# Patient Record
Sex: Female | Born: 1965
Health system: Southern US, Community
[De-identification: ages and names within clinical notes are randomized; demographics above are authoritative.]

## PROBLEM LIST (undated history)

## (undated) ENCOUNTER — Encounter: Attending: Family | Primary: Family

## (undated) ENCOUNTER — Ambulatory Visit

## (undated) ENCOUNTER — Telehealth

## (undated) ENCOUNTER — Ambulatory Visit: Payer: PRIVATE HEALTH INSURANCE | Attending: Family | Primary: Family

## (undated) ENCOUNTER — Ambulatory Visit: Payer: PRIVATE HEALTH INSURANCE

## (undated) ENCOUNTER — Encounter

## (undated) ENCOUNTER — Telehealth: Attending: Registered" | Primary: Registered"

## (undated) ENCOUNTER — Ambulatory Visit: Payer: PRIVATE HEALTH INSURANCE | Attending: Registered" | Primary: Registered"

## (undated) ENCOUNTER — Telehealth
Attending: Pharmacist Clinician (PhC)/ Clinical Pharmacy Specialist | Primary: Pharmacist Clinician (PhC)/ Clinical Pharmacy Specialist

## (undated) ENCOUNTER — Telehealth: Attending: Family | Primary: Family

## (undated) ENCOUNTER — Ambulatory Visit
Payer: PRIVATE HEALTH INSURANCE | Attending: Student in an Organized Health Care Education/Training Program | Primary: Student in an Organized Health Care Education/Training Program

## (undated) ENCOUNTER — Ambulatory Visit
Attending: Student in an Organized Health Care Education/Training Program | Primary: Student in an Organized Health Care Education/Training Program

## (undated) ENCOUNTER — Ambulatory Visit: Attending: Family | Primary: Family

## (undated) ENCOUNTER — Ambulatory Visit: Attending: Obstetrics & Gynecology | Primary: Obstetrics & Gynecology

## (undated) ENCOUNTER — Encounter: Attending: Registered" | Primary: Registered"

## (undated) ENCOUNTER — Telehealth: Payer: PRIVATE HEALTH INSURANCE | Attending: Family | Primary: Family

## (undated) ENCOUNTER — Telehealth: Attending: Clinical | Primary: Clinical

## (undated) ENCOUNTER — Telehealth
Attending: Student in an Organized Health Care Education/Training Program | Primary: Student in an Organized Health Care Education/Training Program

## (undated) ENCOUNTER — Encounter
Attending: Student in an Organized Health Care Education/Training Program | Primary: Student in an Organized Health Care Education/Training Program

## (undated) DIAGNOSIS — I10 Essential (primary) hypertension: Secondary | ICD-10-CM

## (undated) DIAGNOSIS — F32A Depression, unspecified: Secondary | ICD-10-CM

## (undated) DIAGNOSIS — F329 Major depressive disorder, single episode, unspecified: Secondary | ICD-10-CM

## (undated) DIAGNOSIS — Z21 Asymptomatic human immunodeficiency virus [HIV] infection status: Secondary | ICD-10-CM

## (undated) DIAGNOSIS — B019 Varicella without complication: Secondary | ICD-10-CM

## (undated) DIAGNOSIS — N390 Urinary tract infection, site not specified: Secondary | ICD-10-CM

## (undated) DIAGNOSIS — B20 Human immunodeficiency virus [HIV] disease: Secondary | ICD-10-CM

## (undated) DIAGNOSIS — F419 Anxiety disorder, unspecified: Secondary | ICD-10-CM

## (undated) HISTORY — DX: Asymptomatic human immunodeficiency virus (hiv) infection status: Z21

## (undated) HISTORY — DX: Depression, unspecified: F32.A

## (undated) HISTORY — DX: Varicella without complication: B01.9

## (undated) HISTORY — DX: Essential (primary) hypertension: I10

## (undated) HISTORY — DX: Urinary tract infection, site not specified: N39.0

## (undated) HISTORY — DX: Anxiety disorder, unspecified: F41.9

## (undated) HISTORY — DX: Human immunodeficiency virus (HIV) disease: B20

## (undated) HISTORY — DX: Major depressive disorder, single episode, unspecified: F32.9

---

## 1987-09-21 HISTORY — PX: ABDOMINAL HYSTERECTOMY: SHX81

## 1998-02-26 ENCOUNTER — Other Ambulatory Visit: Admission: RE | Admit: 1998-02-26 | Discharge: 1998-02-26 | Payer: Self-pay | Admitting: Family Medicine

## 1998-03-10 ENCOUNTER — Emergency Department (HOSPITAL_COMMUNITY): Admission: EM | Admit: 1998-03-10 | Discharge: 1998-03-10 | Payer: Self-pay

## 1998-10-01 ENCOUNTER — Emergency Department (HOSPITAL_COMMUNITY): Admission: EM | Admit: 1998-10-01 | Discharge: 1998-10-01 | Payer: Self-pay | Admitting: Emergency Medicine

## 1998-10-01 ENCOUNTER — Encounter: Payer: Self-pay | Admitting: Emergency Medicine

## 1999-04-10 ENCOUNTER — Emergency Department (HOSPITAL_COMMUNITY): Admission: EM | Admit: 1999-04-10 | Discharge: 1999-04-11 | Payer: Self-pay | Admitting: *Deleted

## 1999-05-21 ENCOUNTER — Emergency Department (HOSPITAL_COMMUNITY): Admission: EM | Admit: 1999-05-21 | Discharge: 1999-05-21 | Payer: Self-pay | Admitting: Emergency Medicine

## 1999-05-30 ENCOUNTER — Emergency Department (HOSPITAL_COMMUNITY): Admission: EM | Admit: 1999-05-30 | Discharge: 1999-05-30 | Payer: Self-pay | Admitting: Emergency Medicine

## 1999-12-16 ENCOUNTER — Emergency Department (HOSPITAL_COMMUNITY): Admission: EM | Admit: 1999-12-16 | Discharge: 1999-12-16 | Payer: Self-pay | Admitting: Emergency Medicine

## 2000-05-29 ENCOUNTER — Emergency Department (HOSPITAL_COMMUNITY): Admission: EM | Admit: 2000-05-29 | Discharge: 2000-05-29 | Payer: Self-pay | Admitting: Emergency Medicine

## 2000-06-28 ENCOUNTER — Emergency Department (HOSPITAL_COMMUNITY): Admission: EM | Admit: 2000-06-28 | Discharge: 2000-06-28 | Payer: Self-pay | Admitting: Emergency Medicine

## 2001-06-18 ENCOUNTER — Emergency Department (HOSPITAL_COMMUNITY): Admission: EM | Admit: 2001-06-18 | Discharge: 2001-06-18 | Payer: Self-pay | Admitting: Emergency Medicine

## 2001-06-25 ENCOUNTER — Encounter: Payer: Self-pay | Admitting: Emergency Medicine

## 2001-06-25 ENCOUNTER — Emergency Department (HOSPITAL_COMMUNITY): Admission: EM | Admit: 2001-06-25 | Discharge: 2001-06-25 | Payer: Self-pay | Admitting: Emergency Medicine

## 2001-10-12 ENCOUNTER — Inpatient Hospital Stay (HOSPITAL_COMMUNITY): Admission: AD | Admit: 2001-10-12 | Discharge: 2001-10-12 | Payer: Self-pay | Admitting: Obstetrics & Gynecology

## 2002-09-07 ENCOUNTER — Encounter: Payer: Self-pay | Admitting: Plastic Surgery

## 2002-09-07 ENCOUNTER — Ambulatory Visit (HOSPITAL_COMMUNITY): Admission: RE | Admit: 2002-09-07 | Discharge: 2002-09-07 | Payer: Self-pay | Admitting: Plastic Surgery

## 2002-12-14 ENCOUNTER — Emergency Department (HOSPITAL_COMMUNITY): Admission: EM | Admit: 2002-12-14 | Discharge: 2002-12-14 | Payer: Self-pay

## 2002-12-20 ENCOUNTER — Emergency Department (HOSPITAL_COMMUNITY): Admission: EM | Admit: 2002-12-20 | Discharge: 2002-12-20 | Payer: Self-pay

## 2004-06-14 ENCOUNTER — Emergency Department (HOSPITAL_COMMUNITY): Admission: EM | Admit: 2004-06-14 | Discharge: 2004-06-14 | Payer: Self-pay | Admitting: Emergency Medicine

## 2004-07-02 ENCOUNTER — Ambulatory Visit: Payer: Self-pay | Admitting: Family Medicine

## 2004-08-06 ENCOUNTER — Ambulatory Visit: Payer: Self-pay | Admitting: Sports Medicine

## 2004-09-20 ENCOUNTER — Encounter (INDEPENDENT_AMBULATORY_CARE_PROVIDER_SITE_OTHER): Payer: Self-pay | Admitting: *Deleted

## 2004-09-20 LAB — CONVERTED CEMR LAB

## 2004-09-22 ENCOUNTER — Ambulatory Visit: Payer: Self-pay | Admitting: Family Medicine

## 2005-01-07 ENCOUNTER — Ambulatory Visit: Payer: Self-pay | Admitting: Sports Medicine

## 2005-01-27 ENCOUNTER — Ambulatory Visit: Payer: Self-pay | Admitting: Family Medicine

## 2005-01-30 ENCOUNTER — Emergency Department (HOSPITAL_COMMUNITY): Admission: EM | Admit: 2005-01-30 | Discharge: 2005-01-30 | Payer: Self-pay | Admitting: Emergency Medicine

## 2005-06-04 ENCOUNTER — Ambulatory Visit: Payer: Self-pay | Admitting: Family Medicine

## 2005-06-11 ENCOUNTER — Ambulatory Visit: Payer: Self-pay | Admitting: Family Medicine

## 2005-07-21 ENCOUNTER — Ambulatory Visit: Payer: Self-pay | Admitting: Family Medicine

## 2005-09-02 ENCOUNTER — Ambulatory Visit: Payer: Self-pay | Admitting: Sports Medicine

## 2005-10-05 ENCOUNTER — Ambulatory Visit: Payer: Self-pay | Admitting: Family Medicine

## 2005-10-22 ENCOUNTER — Ambulatory Visit: Payer: Self-pay | Admitting: Family Medicine

## 2005-11-19 ENCOUNTER — Ambulatory Visit: Payer: Self-pay | Admitting: Sports Medicine

## 2005-11-24 ENCOUNTER — Ambulatory Visit: Payer: Self-pay | Admitting: Family Medicine

## 2005-12-09 ENCOUNTER — Ambulatory Visit: Payer: Self-pay | Admitting: Family Medicine

## 2005-12-27 ENCOUNTER — Ambulatory Visit: Payer: Self-pay | Admitting: Family Medicine

## 2006-01-07 ENCOUNTER — Ambulatory Visit: Payer: Self-pay | Admitting: Family Medicine

## 2006-02-07 ENCOUNTER — Ambulatory Visit: Payer: Self-pay | Admitting: Family Medicine

## 2006-02-28 ENCOUNTER — Ambulatory Visit: Payer: Self-pay | Admitting: Family Medicine

## 2006-03-25 ENCOUNTER — Other Ambulatory Visit: Admission: RE | Admit: 2006-03-25 | Discharge: 2006-03-25 | Payer: Self-pay | Admitting: Obstetrics and Gynecology

## 2006-03-29 ENCOUNTER — Ambulatory Visit (HOSPITAL_COMMUNITY): Admission: RE | Admit: 2006-03-29 | Discharge: 2006-03-29 | Payer: Self-pay | Admitting: Obstetrics and Gynecology

## 2006-11-17 DIAGNOSIS — F339 Major depressive disorder, recurrent, unspecified: Secondary | ICD-10-CM | POA: Insufficient documentation

## 2006-11-17 DIAGNOSIS — I10 Essential (primary) hypertension: Secondary | ICD-10-CM | POA: Insufficient documentation

## 2006-11-17 DIAGNOSIS — E669 Obesity, unspecified: Secondary | ICD-10-CM | POA: Insufficient documentation

## 2006-11-17 DIAGNOSIS — B20 Human immunodeficiency virus [HIV] disease: Secondary | ICD-10-CM | POA: Insufficient documentation

## 2006-11-18 ENCOUNTER — Encounter (INDEPENDENT_AMBULATORY_CARE_PROVIDER_SITE_OTHER): Payer: Self-pay | Admitting: *Deleted

## 2008-05-09 ENCOUNTER — Emergency Department (HOSPITAL_COMMUNITY): Admission: EM | Admit: 2008-05-09 | Discharge: 2008-05-09 | Payer: Self-pay | Admitting: *Deleted

## 2010-07-09 ENCOUNTER — Emergency Department (HOSPITAL_COMMUNITY): Admission: EM | Admit: 2010-07-09 | Discharge: 2010-07-09 | Payer: Self-pay | Admitting: Emergency Medicine

## 2010-07-21 ENCOUNTER — Emergency Department (HOSPITAL_COMMUNITY): Admission: EM | Admit: 2010-07-21 | Discharge: 2010-07-21 | Payer: Self-pay | Admitting: Emergency Medicine

## 2010-10-11 ENCOUNTER — Encounter: Payer: Self-pay | Admitting: Obstetrics and Gynecology

## 2010-12-01 LAB — URINALYSIS, ROUTINE W REFLEX MICROSCOPIC
Bilirubin Urine: NEGATIVE
Glucose, UA: NEGATIVE mg/dL
Hgb urine dipstick: NEGATIVE
Ketones, ur: NEGATIVE mg/dL
Nitrite: NEGATIVE
Protein, ur: NEGATIVE mg/dL
Specific Gravity, Urine: 1.022 (ref 1.005–1.030)
Urobilinogen, UA: 0.2 mg/dL (ref 0.0–1.0)
pH: 6 (ref 5.0–8.0)

## 2010-12-01 LAB — URINE MICROSCOPIC-ADD ON

## 2010-12-01 LAB — CBC
HCT: 39.1 % (ref 36.0–46.0)
Hemoglobin: 12.7 g/dL (ref 12.0–15.0)
MCH: 29.3 pg (ref 26.0–34.0)
MCHC: 32.5 g/dL (ref 30.0–36.0)
MCV: 90.1 fL (ref 78.0–100.0)
Platelets: 375 10*3/uL (ref 150–400)
RBC: 4.34 MIL/uL (ref 3.87–5.11)
RDW: 13.3 % (ref 11.5–15.5)
WBC: 4.1 10*3/uL (ref 4.0–10.5)

## 2010-12-01 LAB — POCT I-STAT, CHEM 8
BUN: 15 mg/dL (ref 6–23)
Calcium, Ion: 1.12 mmol/L (ref 1.12–1.32)
Chloride: 102 mEq/L (ref 96–112)
Creatinine, Ser: 1 mg/dL (ref 0.4–1.2)
Glucose, Bld: 99 mg/dL (ref 70–99)
HCT: 42 % (ref 36.0–46.0)
Hemoglobin: 14.3 g/dL (ref 12.0–15.0)
Potassium: 3.5 mEq/L (ref 3.5–5.1)
Sodium: 140 mEq/L (ref 135–145)
TCO2: 28 mmol/L (ref 0–100)

## 2010-12-01 LAB — DIFFERENTIAL
Basophils Absolute: 0 10*3/uL (ref 0.0–0.1)
Basophils Relative: 1 % (ref 0–1)
Eosinophils Absolute: 0.2 10*3/uL (ref 0.0–0.7)
Eosinophils Relative: 4 % (ref 0–5)
Lymphocytes Relative: 31 % (ref 12–46)
Lymphs Abs: 1.3 10*3/uL (ref 0.7–4.0)
Monocytes Absolute: 0.3 10*3/uL (ref 0.1–1.0)
Monocytes Relative: 6 % (ref 3–12)
Neutro Abs: 2.4 10*3/uL (ref 1.7–7.7)
Neutrophils Relative %: 58 % (ref 43–77)

## 2011-05-02 ENCOUNTER — Emergency Department (HOSPITAL_COMMUNITY): Payer: BC Managed Care – PPO

## 2011-05-02 ENCOUNTER — Emergency Department (HOSPITAL_COMMUNITY)
Admission: EM | Admit: 2011-05-02 | Discharge: 2011-05-02 | Disposition: A | Discharge status: Home / Self Care | Payer: BC Managed Care – PPO | Attending: Emergency Medicine | Admitting: Emergency Medicine

## 2011-05-02 DIAGNOSIS — R5381 Other malaise: Secondary | ICD-10-CM | POA: Insufficient documentation

## 2011-05-02 DIAGNOSIS — H538 Other visual disturbances: Secondary | ICD-10-CM | POA: Insufficient documentation

## 2011-05-02 DIAGNOSIS — R29898 Other symptoms and signs involving the musculoskeletal system: Secondary | ICD-10-CM | POA: Insufficient documentation

## 2011-05-02 DIAGNOSIS — Z21 Asymptomatic human immunodeficiency virus [HIV] infection status: Secondary | ICD-10-CM | POA: Insufficient documentation

## 2011-05-02 DIAGNOSIS — H5789 Other specified disorders of eye and adnexa: Secondary | ICD-10-CM | POA: Insufficient documentation

## 2011-05-02 DIAGNOSIS — R5383 Other fatigue: Secondary | ICD-10-CM | POA: Insufficient documentation

## 2011-05-02 DIAGNOSIS — I10 Essential (primary) hypertension: Secondary | ICD-10-CM | POA: Insufficient documentation

## 2011-05-02 DIAGNOSIS — F329 Major depressive disorder, single episode, unspecified: Secondary | ICD-10-CM | POA: Insufficient documentation

## 2011-05-02 DIAGNOSIS — F3289 Other specified depressive episodes: Secondary | ICD-10-CM | POA: Insufficient documentation

## 2011-07-05 ENCOUNTER — Emergency Department (HOSPITAL_COMMUNITY)
Admission: EM | Admit: 2011-07-05 | Discharge: 2011-07-05 | Disposition: A | Discharge status: Home / Self Care | Payer: BC Managed Care – PPO | Attending: Emergency Medicine | Admitting: Emergency Medicine

## 2011-07-05 DIAGNOSIS — Z21 Asymptomatic human immunodeficiency virus [HIV] infection status: Secondary | ICD-10-CM | POA: Insufficient documentation

## 2011-07-05 DIAGNOSIS — F329 Major depressive disorder, single episode, unspecified: Secondary | ICD-10-CM | POA: Insufficient documentation

## 2011-07-05 DIAGNOSIS — Z79899 Other long term (current) drug therapy: Secondary | ICD-10-CM | POA: Insufficient documentation

## 2011-07-05 DIAGNOSIS — F3289 Other specified depressive episodes: Secondary | ICD-10-CM | POA: Insufficient documentation

## 2011-07-05 DIAGNOSIS — I10 Essential (primary) hypertension: Secondary | ICD-10-CM | POA: Insufficient documentation

## 2011-07-05 DIAGNOSIS — R109 Unspecified abdominal pain: Secondary | ICD-10-CM | POA: Insufficient documentation

## 2011-07-05 DIAGNOSIS — K5900 Constipation, unspecified: Secondary | ICD-10-CM | POA: Insufficient documentation

## 2011-10-28 ENCOUNTER — Ambulatory Visit (INDEPENDENT_AMBULATORY_CARE_PROVIDER_SITE_OTHER): Payer: BC Managed Care – PPO | Admitting: Family Medicine

## 2011-10-28 ENCOUNTER — Encounter: Payer: Self-pay | Admitting: Family Medicine

## 2011-10-28 DIAGNOSIS — E669 Obesity, unspecified: Secondary | ICD-10-CM

## 2011-10-28 DIAGNOSIS — B20 Human immunodeficiency virus [HIV] disease: Secondary | ICD-10-CM

## 2011-10-28 DIAGNOSIS — I10 Essential (primary) hypertension: Secondary | ICD-10-CM

## 2011-10-28 DIAGNOSIS — F339 Major depressive disorder, recurrent, unspecified: Secondary | ICD-10-CM

## 2011-10-28 NOTE — Progress Notes (Signed)
  Subjective:    Patient ID: Kelli Stout, female    DOB: Nov 22, 1965, 46 y.o.   MRN: [id number]  HPI  New patient to establish care. Patient has history of HIV followed by another clinic, hypertension, of recurrent depression, and obesity. She has recently started exercise program at home with walking and jogging on a treadmill. She has had previous partial hysterectomy secondary to fibroids 1989. Medications reviewed as outlined. Compliant with all. Recent postmenopausal hot flashes. She was started by gynecologist on Effexor and clonidine for that and still has some breakthrough.  Hypertension treated with Maxzide and Norvasc. Blood pressure is well controlled. No dizziness. No recent chest pains.  Family history as recorded elsewhere. Strong family history of hypertension and hyperlipidemia.  Patient is widowed. She works as a Advertising copywriter. No history of smoking and rare alcohol use.  Review of Systems  Constitutional: Negative for fever, activity change, appetite change, fatigue and unexpected weight change.  HENT: Negative for hearing loss, ear pain, sore throat and trouble swallowing.   Eyes: Negative for visual disturbance.  Respiratory: Negative for cough and shortness of breath.   Cardiovascular: Negative for chest pain and palpitations.  Gastrointestinal: Negative for abdominal pain, diarrhea, constipation and blood in stool.  Genitourinary: Negative for dysuria and hematuria.  Musculoskeletal: Negative for myalgias, back pain and arthralgias.  Skin: Negative for rash.  Neurological: Negative for dizziness, syncope and headaches.  Hematological: Negative for adenopathy.  Psychiatric/Behavioral: Negative for confusion and dysphoric mood.       Objective:   Physical Exam  Constitutional: She is oriented to person, place, and time. She appears well-developed and well-nourished. No distress.  HENT:  Mouth/Throat: Oropharynx is clear and moist.  Neck: Neck supple. No  thyromegaly present.  Cardiovascular: Normal rate and regular rhythm.   Pulmonary/Chest: Effort normal and breath sounds normal. No respiratory distress. She has no wheezes. She has no rales.  Musculoskeletal: She exhibits no edema.  Lymphadenopathy:    She has no cervical adenopathy.  Neurological: She is alert and oriented to person, place, and time.  Psychiatric: She has a normal mood and affect. Her behavior is normal.          Assessment & Plan:  #1 obesity. Discussed weight loss strategies. She has recently started regular exercise program.  #2 hypertension stable medical current medications  #3 HIV-positive followed at infectious disease clinic elsewhere  #4 history of recurrent depression stable on Effexor #5 health maintenance schedule complete physical

## 2012-02-22 ENCOUNTER — Encounter (HOSPITAL_COMMUNITY): Payer: Self-pay | Admitting: Emergency Medicine

## 2012-02-22 ENCOUNTER — Emergency Department (HOSPITAL_COMMUNITY): Payer: BC Managed Care – PPO

## 2012-02-22 ENCOUNTER — Emergency Department (HOSPITAL_COMMUNITY)
Admission: EM | Admit: 2012-02-22 | Discharge: 2012-02-22 | Discharge status: Against Medical Advice | Payer: BC Managed Care – PPO | Attending: Emergency Medicine | Admitting: Emergency Medicine

## 2012-02-22 DIAGNOSIS — H539 Unspecified visual disturbance: Secondary | ICD-10-CM | POA: Insufficient documentation

## 2012-02-22 DIAGNOSIS — R42 Dizziness and giddiness: Secondary | ICD-10-CM | POA: Insufficient documentation

## 2012-02-22 DIAGNOSIS — R110 Nausea: Secondary | ICD-10-CM | POA: Insufficient documentation

## 2012-02-22 DIAGNOSIS — H53149 Visual discomfort, unspecified: Secondary | ICD-10-CM | POA: Insufficient documentation

## 2012-02-22 DIAGNOSIS — R51 Headache: Secondary | ICD-10-CM | POA: Insufficient documentation

## 2012-02-22 MED ORDER — DEXAMETHASONE SODIUM PHOSPHATE 10 MG/ML IJ SOLN
10.0000 mg | Freq: Once | INTRAMUSCULAR | Status: AC
  Administered 2012-02-22: 10 mg via INTRAVENOUS
  Filled 2012-02-22: qty 1

## 2012-02-22 MED ORDER — METOCLOPRAMIDE HCL 5 MG/ML IJ SOLN
10.0000 mg | Freq: Once | INTRAMUSCULAR | Status: AC
  Administered 2012-02-22: 10 mg via INTRAVENOUS
  Filled 2012-02-22: qty 2

## 2012-02-22 MED ORDER — SODIUM CHLORIDE 0.9 % IV BOLUS (SEPSIS)
1000.0000 mL | Freq: Once | INTRAVENOUS | Status: AC
  Administered 2012-02-22: 1000 mL via INTRAVENOUS

## 2012-02-22 MED ORDER — DIPHENHYDRAMINE HCL 50 MG/ML IJ SOLN
25.0000 mg | Freq: Once | INTRAMUSCULAR | Status: AC
  Administered 2012-02-22: 25 mg via INTRAVENOUS
  Filled 2012-02-22: qty 1

## 2012-02-22 NOTE — ED Notes (Signed)
Patient c/o headache everyday when she waits up x 2-3 weeks. C/o periods of dizziness. With occ. Blurred vision. No change in headache today states they just keep coming back.

## 2012-02-22 NOTE — ED Provider Notes (Signed)
History     CSN: [id number]  Arrival date & time 02/22/12  0406   First MD Initiated Contact with Patient 02/22/12 [phone]      Chief Complaint  Patient presents with  . Headache    (Consider location/radiation/quality/duration/timing/severity/associated sxs/prior treatment) HPI Comments: Patient with PMH significant for HIV who is compliant on her retroviral and reports last viral load as undetectable and CD4 count of 800 presents with a three week history of parietal headaches with radiation to frontal sinuses - she reports nausea, dizziness and photophobia associated with this.  She denies nasal congestion, throbbing nature to the pain, fever, chills, neck pain or stiffness.  States she has been trying OTC medications without relief - she reports that the headaches are present in the mornings, and ease on their own through the day.  She reports occasional blurred vision.    Patient is a 46 y.o. female presenting with headaches. The history is provided by the patient. No language interpreter was used.  Headache  This is a new problem. The current episode started more than 1 week ago. The problem occurs constantly. The problem has not changed since onset.The headache is associated with bright light and loud noise. The pain is located in the frontal region. The quality of the pain is described as sharp. The pain is at a severity of 7/10. The pain is moderate. The pain radiates to the face. Associated symptoms include nausea. Pertinent negatives include no anorexia, no fever, no malaise/fatigue, no chest pressure, no near-syncope, no orthopnea, no palpitations, no syncope, no shortness of breath and no vomiting. She has tried nothing for the symptoms. The treatment provided no relief.    Past Medical History  Diagnosis Date  . Chicken pox   . Depression   . Hypertension   . UTI (urinary tract infection)     Past Surgical History  Procedure Date  . Abdominal hysterectomy 1989    partial     Family History  Problem Relation Age of Onset  . Hyperlipidemia Mother   . Heart disease Mother   . Hypertension Mother   . Mental illness Mother   . Hypertension Father   . Diabetes Father     type ll  . Heart disease Maternal Uncle   . Arthritis Maternal Grandmother   . Cancer Maternal Grandmother     bladder  . Cancer Son 17    osteosarcoma    History  Substance Use Topics  . Smoking status: Never Smoker   . Smokeless tobacco: Not on file  . Alcohol Use: No    OB History    Grav Para Term Preterm Abortions TAB SAB Ect Mult Living                  Review of Systems  Constitutional: Negative for fever, chills and malaise/fatigue.  HENT: Negative for nosebleeds, congestion, facial swelling, rhinorrhea, neck pain, neck stiffness and sinus pressure.   Eyes: Positive for photophobia and visual disturbance.  Respiratory: Negative for shortness of breath.   Cardiovascular: Negative for chest pain, palpitations, orthopnea, syncope and near-syncope.  Gastrointestinal: Positive for nausea. Negative for vomiting, abdominal pain and anorexia.  Genitourinary: Negative for dysuria.  Musculoskeletal: Negative for back pain.  Skin: Negative for rash.  Neurological: Positive for dizziness and headaches. Negative for syncope, speech difficulty, weakness and numbness.  Psychiatric/Behavioral: Negative for confusion.  All other systems reviewed and are negative.    Allergies  Review of patient's allergies indicates no known allergies.  Home Medications   Current Outpatient Rx  Name Route Sig Dispense Refill  . AMLODIPINE BESYLATE 5 MG PO TABS Oral Take 5 mg by mouth daily. 1/2 tab daily    . CETIRIZINE HCL 10 MG PO TABS Oral Take 10 mg by mouth daily.    Marland Kitchen CLONIDINE HCL 0.2 MG PO TABS Oral Take 0.2 mg by mouth 2 (two) times daily as needed.    Marland Kitchen RILPIVIRINE HCL 25 MG PO TABS Oral Take 25 mg by mouth daily.    . TRIAMTERENE-HCTZ 37.5-25 MG PO CAPS  1/2 tab daily    .  VALACYCLOVIR HCL 500 MG PO TABS Oral Take 500 mg by mouth daily.    . VENLAFAXINE HCL ER 75 MG PO CP24 Oral Take 75 mg by mouth daily.      BP 129/83  Pulse 94  Temp(Src) 98 F (36.7 C) (Oral)  Resp 16  SpO2 100%  Physical Exam  Nursing note and vitals reviewed. Constitutional: She is oriented to person, place, and time. She appears well-developed and well-nourished. No distress.  HENT:  Head: Normocephalic and atraumatic.  Right Ear: External ear normal.  Left Ear: External ear normal.  Nose: Nose normal.  Mouth/Throat: Oropharynx is clear and moist. No oropharyngeal exudate.       No scalp tenderness - no maxillary sinus or frontal sinus ttp  Eyes: Conjunctivae and EOM are normal. Pupils are equal, round, and reactive to light. Right eye exhibits no discharge. Left eye exhibits no discharge. No scleral icterus.  Neck: Normal range of motion. Neck supple. No Brudzinski's sign and no Kernig's sign noted.  Cardiovascular: Normal rate, regular rhythm and normal heart sounds.  Exam reveals no gallop and no friction rub.   No murmur heard. Pulmonary/Chest: Effort normal and breath sounds normal. No respiratory distress. She has no wheezes. She has no rales. She exhibits no tenderness.  Abdominal: Soft. Bowel sounds are normal. She exhibits no distension. There is no tenderness.  Musculoskeletal: Normal range of motion. She exhibits no edema and no tenderness.  Lymphadenopathy:    She has no cervical adenopathy.  Neurological: She is alert and oriented to person, place, and time. She has normal strength and normal reflexes. She displays no tremor. No cranial nerve deficit or sensory deficit. She exhibits normal muscle tone. Coordination and gait normal. GCS eye subscore is 4. GCS verbal subscore is 5. GCS motor subscore is 6.  Skin: Skin is warm and dry. No rash noted. No erythema. No pallor.  Psychiatric: She has a normal mood and affect. Her behavior is normal. Judgment and thought  content normal.    ED Course  Procedures (including critical care time)  Labs Reviewed - No data to display Ct Head Wo Contrast  02/22/2012  *RADIOLOGY REPORT*  Clinical Data: Headache.  CT HEAD WITHOUT CONTRAST  Technique:  Contiguous axial images were obtained from the base of the skull through the vertex without contrast.  Comparison: 05/02/2011.  Findings: The ventricles are normal.  No extra-axial fluid collections are seen.  The brainstem and cerebellum are unremarkable.  No acute intracranial findings such as infarction or hemorrhage.  No mass lesions.  The bony calvarium is intact.  The visualized paranasal sinuses and mastoid air cells are clear.  Cavum septum pellucidum again noted.  IMPRESSION: No acute intracranial findings or mass lesions.  No change since prior study.  Original Report Authenticated By: P. Loralie Champagne, M.D.     Headache    MDM  Patient  here with daily headaches for the past 3 weeks usually in the mornings, I do not suspect HIV/AIDS sequella as the patient is compliant with medication regimen and therefore not immunocompromised.  CT scan was normal.  The patient was given the "migraine cocktail" but then I was informed by the nurse that the patient had pulled out her own IV and left without signing paperwork.         Izola Price [place], [place] 02/22/12 [phone]

## 2012-02-22 NOTE — ED Notes (Signed)
Patient pulled her IV out and was walking out . I stopped the patient and ask her why she was leaving states she just needs to go. I ask her to wait and speak with the MD states she  Just had to go. Refused to speak to MD.

## 2012-02-22 NOTE — ED Provider Notes (Signed)
Medical screening examination/treatment/procedure(s) were performed by non-physician practitioner and as supervising physician I was immediately available for consultation/collaboration.    Vida Roller, MD 02/22/12 [phone]

## 2012-02-22 NOTE — ED Notes (Signed)
Patient complaining of headaches upon awakening for the past two weeks; patient states that she is also experiencing light sensitivity and sound sensitivity.

## 2012-04-10 ENCOUNTER — Other Ambulatory Visit: Payer: Self-pay | Admitting: Nephrology

## 2012-04-10 DIAGNOSIS — I10 Essential (primary) hypertension: Secondary | ICD-10-CM

## 2012-04-10 DIAGNOSIS — N182 Chronic kidney disease, stage 2 (mild): Secondary | ICD-10-CM

## 2012-04-14 ENCOUNTER — Ambulatory Visit
Admission: RE | Admit: 2012-04-14 | Discharge: 2012-04-14 | Disposition: A | Discharge status: Home / Self Care | Payer: BC Managed Care – PPO | Source: Ambulatory Visit | Attending: Nephrology | Admitting: Nephrology

## 2012-04-14 DIAGNOSIS — I10 Essential (primary) hypertension: Secondary | ICD-10-CM

## 2012-04-14 DIAGNOSIS — N182 Chronic kidney disease, stage 2 (mild): Secondary | ICD-10-CM

## 2012-05-26 ENCOUNTER — Telehealth: Payer: Self-pay | Admitting: *Deleted

## 2012-05-26 ENCOUNTER — Ambulatory Visit: Payer: BC Managed Care – PPO | Admitting: Family Medicine

## 2012-05-26 DIAGNOSIS — Z0289 Encounter for other administrative examinations: Secondary | ICD-10-CM

## 2012-05-26 NOTE — Telephone Encounter (Signed)
Pt no show for 7 month follow-up, message left on home phone VM

## 2012-06-07 ENCOUNTER — Other Ambulatory Visit: Payer: Self-pay | Admitting: *Deleted

## 2012-06-07 ENCOUNTER — Encounter: Payer: Self-pay | Admitting: Family Medicine

## 2012-06-07 ENCOUNTER — Ambulatory Visit (INDEPENDENT_AMBULATORY_CARE_PROVIDER_SITE_OTHER): Payer: BC Managed Care – PPO | Admitting: Family Medicine

## 2012-06-07 VITALS — BP 104/70 | Temp 98.2°F | Wt 270.0 lb

## 2012-06-07 DIAGNOSIS — I10 Essential (primary) hypertension: Secondary | ICD-10-CM

## 2012-06-07 DIAGNOSIS — Z23 Encounter for immunization: Secondary | ICD-10-CM

## 2012-06-07 LAB — GLUCOSE, POCT (MANUAL RESULT ENTRY)

## 2012-06-07 NOTE — Progress Notes (Signed)
  Subjective:    Patient ID: Kelli Stout, female    DOB: [birth year], 46 y.o.   MRN: [id number]  HPI  Patient seen for medical followup. She has history of HIV which is followed closely in [place]. She apparently has been very stable for several years "with no trace of HIV at this time"-per patient. She remains on 2 drug regimen. Hypertension treated with amlodipine and Dyazide. Compliant with medications. Lost 14 pounds since last visit due to her efforts. Exercising fairly regularly. Overall feels better. No orthostasis. No chest pains. No peripheral edema.  She has been treated by GYN with clonidine. This was used for hot flushes and not for hypertension. Began, no orthostasis. Still has some intermittent hot flushes.  Past Medical History  Diagnosis Date  . Chicken pox   . Depression   . Hypertension   . UTI (urinary tract infection)    Past Surgical History  Procedure Date  . Abdominal hysterectomy 1989    partial    reports that she has never smoked. She does not have any smokeless tobacco history on file. She reports that she does not drink alcohol or use illicit drugs. family history includes Arthritis in her maternal grandmother; Cancer in her maternal grandmother; Cancer (age of onset:17) in her son; Diabetes in her father; Heart disease in her maternal uncle and mother; Hyperlipidemia in her mother; Hypertension in her father and mother; and Mental illness in her mother. No Known Allergies    Review of Systems  Constitutional: Negative for fatigue.  Eyes: Negative for visual disturbance.  Respiratory: Negative for cough, chest tightness, shortness of breath and wheezing.   Cardiovascular: Negative for chest pain, palpitations and leg swelling.  Neurological: Negative for dizziness, seizures, syncope, weakness, light-headedness and headaches.       Objective:   Physical Exam  Constitutional: She appears well-developed and well-nourished.  Neck: Neck supple.  No thyromegaly present.  Cardiovascular: Normal rate and regular rhythm.   Pulmonary/Chest: Effort normal and breath sounds normal. No respiratory distress. She has no wheezes. She has no rales.  Musculoskeletal: She exhibits no edema.  Lymphadenopathy:    She has no cervical adenopathy.          Assessment & Plan:  Hypertension. Very well controlled. Discussed possible tapering off clonidine if she considers hot flushes adequately controlled down the road. Continue weight loss efforts. Check basic metabolic panel. Influenza vaccine given. We have suggested complete physical at some point later this year.

## 2012-06-07 NOTE — Patient Instructions (Signed)
Entered POC CBG in error, wrong chart, please do not charge.  Thank you

## 2012-06-08 LAB — BASIC METABOLIC PANEL
BUN: 15 mg/dL (ref 6–23)
CO2: 28 mEq/L (ref 19–32)
Calcium: 8.7 mg/dL (ref 8.4–10.5)
Chloride: 105 mEq/L (ref 96–112)
Creatinine, Ser: 1.3 mg/dL — ABNORMAL HIGH (ref 0.4–1.2)
GFR: 55.58 mL/min — ABNORMAL LOW (ref >60.00)
Glucose, Bld: 96 mg/dL (ref 70–99)
Potassium: 4 mEq/L (ref 3.5–5.1)
Sodium: 139 mEq/L (ref 135–145)

## 2012-06-08 NOTE — Progress Notes (Signed)
Quick Note:  Left a message for pt to return call. ______ 

## 2012-06-09 ENCOUNTER — Telehealth: Payer: Self-pay | Admitting: Family Medicine

## 2012-06-09 NOTE — Telephone Encounter (Signed)
Patient is aware of lab results.

## 2012-06-09 NOTE — Telephone Encounter (Signed)
Pt would like blood work results °

## 2012-07-04 ENCOUNTER — Ambulatory Visit (INDEPENDENT_AMBULATORY_CARE_PROVIDER_SITE_OTHER): Payer: BC Managed Care – PPO | Admitting: Internal Medicine

## 2012-07-04 ENCOUNTER — Encounter: Payer: Self-pay | Admitting: Internal Medicine

## 2012-07-04 VITALS — BP 120/80 | Temp 98.0°F | Wt 271.0 lb

## 2012-07-04 DIAGNOSIS — I10 Essential (primary) hypertension: Secondary | ICD-10-CM

## 2012-07-04 DIAGNOSIS — J40 Bronchitis, not specified as acute or chronic: Secondary | ICD-10-CM

## 2012-07-04 DIAGNOSIS — M545 Low back pain, unspecified: Secondary | ICD-10-CM

## 2012-07-04 LAB — POCT URINALYSIS DIPSTICK
Bilirubin, UA: NEGATIVE
Glucose, UA: NEGATIVE
Ketones, UA: NEGATIVE
Leukocytes, UA: NEGATIVE
Nitrite, UA: NEGATIVE
Spec Grav, UA: >=1.03
Urobilinogen, UA: 0.2
pH, UA: 6

## 2012-07-04 MED ORDER — DOXYCYCLINE HYCLATE 100 MG PO TABS: 100.0000 mg | ORAL_TABLET | Freq: Two times a day (BID) | ORAL | Status: DC

## 2012-07-04 MED ORDER — HYDROCODONE-HOMATROPINE 5-1.5 MG/5ML PO SYRP: 5.0000 mL | ORAL_SOLUTION | Freq: Four times a day (QID) | ORAL | Status: AC | PRN

## 2012-07-04 NOTE — Progress Notes (Signed)
Subjective:    Patient ID: Kelli Stout, female    DOB: 1966-05-05, 46 y.o.   MRN: [id number]  HPI 46 year old patient history of HIV and hypertension who presents with a two-week history of head and chest congestion. More recently she has developed low-grade fever and some nocturnal chills cough is productive of yellow sputum. No shortness of breath chest pain or wheezing.  Past Medical History  Diagnosis Date  . Chicken pox   . Depression   . Hypertension   . UTI (urinary tract infection)     History   Social History  . Marital Status: Single    Spouse Name: N/A    Number of Children: N/A  . Years of Education: N/A   Occupational History  . Not on file.   Social History Main Topics  . Smoking status: Never Smoker   . Smokeless tobacco: Not on file  . Alcohol Use: No  . Drug Use: No  . Sexually Active: Not on file   Other Topics Concern  . Not on file   Social History Narrative  . No narrative on file    Past Surgical History  Procedure Date  . Abdominal hysterectomy 1989    partial    Family History  Problem Relation Age of Onset  . Hyperlipidemia Mother   . Heart disease Mother   . Hypertension Mother   . Mental illness Mother   . Hypertension Father   . Diabetes Father     type ll  . Heart disease Maternal Uncle   . Arthritis Maternal Grandmother   . Cancer Maternal Grandmother     bladder  . Cancer Son 17    osteosarcoma    No Known Allergies  Current Outpatient Prescriptions on File Prior to Visit  Medication Sig Dispense Refill  . amLODipine (NORVASC) 5 MG tablet Take 5 mg by mouth daily. 1/2 tab daily      . cetirizine (ZYRTEC) 10 MG tablet Take 10 mg by mouth daily.      . cloNIDine (CATAPRES) 0.2 MG tablet Take 0.2 mg by mouth 2 (two) times daily as needed.      . rilpivirine (ENDURANT) 25 MG TABS tablet Take 25 mg by mouth daily.      Marland Kitchen triamterene-hydrochlorothiazide (DYAZIDE) 37.5-25 MG per capsule 1/2 tab daily      . TRUVADA  200-300 MG per tablet Take 1 tablet by mouth daily. Per Dr Corine Shelter      . valACYclovir (VALTREX) 500 MG tablet Take 500 mg by mouth daily.      Marland Kitchen venlafaxine (EFFEXOR-XR) 75 MG 24 hr capsule Take 75 mg by mouth daily.        BP 120/80  Temp 98 F (36.7 C) (Oral)  Wt 271 lb (122.925 kg)       Review of Systems  Constitutional: Positive for fever, chills and fatigue.  HENT: Negative for hearing loss, congestion, sore throat, rhinorrhea, dental problem, sinus pressure and tinnitus.   Eyes: Negative for pain, discharge and visual disturbance.  Respiratory: Positive for cough. Negative for shortness of breath.   Cardiovascular: Negative for chest pain, palpitations and leg swelling.  Gastrointestinal: Negative for nausea, vomiting, abdominal pain, diarrhea, constipation, blood in stool and abdominal distention.  Genitourinary: Negative for dysuria, urgency, frequency, hematuria, flank pain, vaginal bleeding, vaginal discharge, difficulty urinating, vaginal pain and pelvic pain.  Musculoskeletal: Negative for joint swelling, arthralgias and gait problem.  Skin: Negative for rash.  Neurological: Negative for dizziness, syncope, speech  difficulty, weakness, numbness and headaches.  Hematological: Negative for adenopathy.  Psychiatric/Behavioral: Negative for behavioral problems, dysphoric mood and agitation. The patient is not nervous/anxious.        Objective:   Physical Exam  Constitutional: She is oriented to person, place, and time. She appears well-developed and well-nourished. No distress.  HENT:  Head: Normocephalic.  Right Ear: External ear normal.  Left Ear: External ear normal.  Mouth/Throat: Oropharynx is clear and moist.  Eyes: Conjunctivae normal and EOM are normal. Pupils are equal, round, and reactive to light.  Neck: Normal range of motion. Neck supple. No thyromegaly present.  Cardiovascular: Normal rate, regular rhythm, normal heart sounds and intact distal pulses.    Pulmonary/Chest: Effort normal.       Few scattered rhonchi  Abdominal: Soft. Bowel sounds are normal. She exhibits no mass. There is no tenderness.  Musculoskeletal: Normal range of motion.  Lymphadenopathy:    She has no cervical adenopathy.  Neurological: She is alert and oriented to person, place, and time.  Skin: Skin is warm and dry. No rash noted.  Psychiatric: She has a normal mood and affect. Her behavior is normal.          Assessment & Plan:   URI/ bronchitis. Will treat symptomatically. Also doxycycline 100 twice a day for 7 days

## 2012-07-04 NOTE — Patient Instructions (Signed)
    Use saline irrigation, warm  moist compresses and over-the-counter decongestants only as directed.  Call if there is no improvement in 5 to 7 days, or sooner if you develop increasing pain, fever, or any new symptoms.  Take your antibiotic as prescribed until ALL of it is gone, but stop if you develop a rash, swelling, or any side effects of the medication.  Contact our office as soon as possible if  there are side effects of the medication. 

## 2012-08-10 ENCOUNTER — Encounter: Payer: Self-pay | Admitting: Family Medicine

## 2012-08-10 ENCOUNTER — Other Ambulatory Visit: Payer: Self-pay | Admitting: *Deleted

## 2012-08-10 ENCOUNTER — Ambulatory Visit (INDEPENDENT_AMBULATORY_CARE_PROVIDER_SITE_OTHER): Payer: BC Managed Care – PPO | Admitting: Family Medicine

## 2012-08-10 VITALS — BP 110/78 | Temp 98.2°F | Wt 277.0 lb

## 2012-08-10 DIAGNOSIS — R3915 Urgency of urination: Secondary | ICD-10-CM

## 2012-08-10 LAB — POCT URINALYSIS DIPSTICK
Bilirubin, UA: NEGATIVE
Glucose, UA: NEGATIVE
Ketones, UA: NEGATIVE
Leukocytes, UA: NEGATIVE
Nitrite, UA: NEGATIVE
Protein, UA: NEGATIVE
Spec Grav, UA: 1.01
Urobilinogen, UA: 0.2
pH, UA: 7

## 2012-08-10 MED ORDER — TRIAMTERENE-HCTZ 37.5-25 MG PO CAPS: ORAL_CAPSULE | ORAL | Status: DC

## 2012-08-10 NOTE — Progress Notes (Signed)
Subjective:     Patient ID: Kelli Stout, female   DOB: [birth year], 46 y.o.   MRN: [id number]  HPI 45 year old with history of HIV and HTN here for evaluation of what she calls UTI symptoms and lower back pain.  She describes increased frequency and urgency, especially at night for the last 10 days.  Denies burning or pain with urination, or fever, chills, nausea or vomiting.  Has not increased fluid intake over this time.  Is sexually active recently with a new partner, and reports good condom use with intercourse.  Denies any vaginal discharge or pruritis.   Lower back pain started 1-2 days after urinary symptoms, says that it is localized more on the right than left.  Does not radiate and has not limited ADLs.  Review of Systems  Constitutional: Negative for fever and chills.  Gastrointestinal: Negative for nausea, vomiting and abdominal pain.  Genitourinary: Positive for dysuria, urgency, frequency and enuresis (one episode when unable to make it to bathroom). Negative for hematuria, flank pain, vaginal discharge and vaginal pain.  Musculoskeletal: Positive for back pain (R>L).  Neurological: Negative for light-headedness.       Objective:   Physical Exam  Constitutional: She is oriented to person, place, and time. She appears well-developed and well-nourished.  Cardiovascular: Normal rate, regular rhythm and normal heart sounds.   Pulmonary/Chest: Effort normal and breath sounds normal. No respiratory distress.  Genitourinary:       No CVA tenderness.   Musculoskeletal: She exhibits no tenderness.       LE strength 5/5 bilaterally.  Neurological: She is alert and oriented to person, place, and time. She has normal reflexes.        Assessment:     46 year old with history of HIV and HTN here for evaluation of urinary symptoms and back pain.    Plan:     1. Urinary symptoms: unlikely infection.  UA clean with no nitrites or leukocytes, and only trace blood.  Also no  burning or pain on urination, fever, nausea, vomiting or CVA tenderness make cystitis or pyelonephritis less likely.  Pt would like GC/Chlamydia screening, so will do urine GC/CT.  Discourage daily caffeine after 3pm.  Follow up if fever, nausea or vomiting develop. 2. Back pain: likely not related to urinary symptoms.  No radiating down legs, numbness or tingling and not tender to palpation.  Likely muscular.  Continue to monitor, but follow up if weakness, radiation, numbness, or loss of bowel or bladder function occur.  Kelli Stout, MS3     Agree with assessment and plan as per Kelli Schiller, MS 3 Kelli Peat MD

## 2012-08-10 NOTE — Patient Instructions (Addendum)
Follow up promptly for any fever, vomiting, or any progressive back pain.

## 2012-08-11 LAB — GC/CHLAMYDIA PROBE AMP, URINE
Chlamydia, Swab/Urine, PCR: NEGATIVE
GC Probe Amp, Urine: NEGATIVE

## 2012-08-16 LAB — HM MAMMOGRAPHY: HM Mammogram: NEGATIVE

## 2012-08-25 ENCOUNTER — Encounter: Payer: Self-pay | Admitting: Family Medicine

## 2012-09-19 ENCOUNTER — Emergency Department (INDEPENDENT_AMBULATORY_CARE_PROVIDER_SITE_OTHER)
Admission: EM | Admit: 2012-09-19 | Discharge: 2012-09-19 | Disposition: A | Discharge status: Home / Self Care | Payer: BC Managed Care – PPO | Source: Home / Self Care | Attending: Family Medicine | Admitting: Family Medicine

## 2012-09-19 ENCOUNTER — Telehealth: Payer: Self-pay | Admitting: Family Medicine

## 2012-09-19 ENCOUNTER — Encounter (HOSPITAL_COMMUNITY): Payer: Self-pay | Admitting: *Deleted

## 2012-09-19 DIAGNOSIS — R609 Edema, unspecified: Secondary | ICD-10-CM

## 2012-09-19 DIAGNOSIS — R600 Localized edema: Secondary | ICD-10-CM

## 2012-09-19 LAB — POCT I-STAT, CHEM 8
BUN: 12 mg/dL (ref 6–23)
Calcium, Ion: 1.16 mmol/L (ref 1.12–1.23)
Chloride: 103 mEq/L (ref 96–112)
Creatinine, Ser: 1.1 mg/dL (ref 0.50–1.10)
Glucose, Bld: 96 mg/dL (ref 70–99)
HCT: 40 % (ref 36.0–46.0)
Hemoglobin: 13.6 g/dL (ref 12.0–15.0)
Potassium: 3.7 mEq/L (ref 3.5–5.1)
Sodium: 140 mEq/L (ref 135–145)
TCO2: 27 mmol/L (ref 0–100)

## 2012-09-19 LAB — POCT URINALYSIS DIP (DEVICE)
Bilirubin Urine: NEGATIVE
Glucose, UA: NEGATIVE mg/dL
Ketones, ur: NEGATIVE mg/dL
Leukocytes, UA: NEGATIVE
Nitrite: NEGATIVE
Protein, ur: NEGATIVE mg/dL
Specific Gravity, Urine: 1.02 (ref 1.005–1.030)
Urobilinogen, UA: 0.2 mg/dL (ref 0.0–1.0)
pH: 6.5 (ref 5.0–8.0)

## 2012-09-19 MED ORDER — FUROSEMIDE 40 MG PO TABS: 40.0000 mg | ORAL_TABLET | Freq: Every day | ORAL | Status: DC

## 2012-09-19 NOTE — Telephone Encounter (Signed)
Patient Information:  Caller Name: Carmisha  Phone: [phone]  Patient: Kelli Stout, Kelli Stout  Gender: Female  DOB: [birth year]  Age: 46 Years  PCP: Evelena Peat [facility])  Pregnant: No  Office Follow Up:  Does the office need to follow up with this patient?: No  Instructions For The Office: N/A  RN Note:  She is about to leave town and unable to come in for appointment. UC advised.  Symptoms  Reason For Call & Symptoms: Ankles are swollen and back feels achy on L side onset 09/17/12. She is having Urinary sx of urgency, frequency and Vaginal Irritation/itching for past week. Urine clear yellow. Afebrile.  Reviewed Health History In EMR: Yes  Reviewed Medications In EMR: Yes  Reviewed Allergies In EMR: Yes  Reviewed Surgeries / Procedures: Yes  Date of Onset of Symptoms: 09/17/2012  Treatments Tried: Ibuprofen 2 tabs PO today for headache  Treatments Tried Worked: No OB / GYN:  LMP: Unknown  Guideline(s) Used:  Flank Pain  Leg Swelling and Edema  Urination Pain - Female  Disposition Per Guideline:   Go to Office Now  Reason For Disposition Reached:   Side (flank) or lower back pain present  Advice Given:  Expected Course:  If your leg swelling does not get better during the next week or if it recurs, make an appointment with your doctor.  Call Back If:  Swelling becomes worse  Swelling becomes red or painful to the touch  Calf pain occurs and becomes constant  You become worse.  Fluids:   Drink extra fluids. Drink 8-10 glasses of liquids a day (Reason: to produce a dilute, non-irritating urine).  Call Back If:   Fever lasts more than 24 hours on antibiotics  Pain does not improve by day 3 on antibiotics  Urine symptoms do not improve by day 3 on antibiotics  You become worse.  Patient Refused Recommendation:  Patient Will Go To [facility]  She is about to leave town and unable to come in to be checked. Advised UC.

## 2012-09-19 NOTE — ED Provider Notes (Signed)
History     CSN: [id number]  Arrival date & time 09/19/12  1657   First MD Initiated Contact with Patient 09/19/12 1754      Chief Complaint  Patient presents with  . Back Pain    (Consider location/radiation/quality/duration/timing/severity/associated sxs/prior treatment) Patient is a 46 y.o. female presenting with leg pain. The history is provided by the patient.  Leg Pain  The incident occurred 2 days ago (went out of town and has been sitting a lot during  travel., having low back soreness and lower ext swelling.).    Past Medical History  Diagnosis Date  . Chicken pox   . Depression   . Hypertension   . UTI (urinary tract infection)     Past Surgical History  Procedure Date  . Abdominal hysterectomy 1989    partial    Family History  Problem Relation Age of Onset  . Hyperlipidemia Mother   . Heart disease Mother   . Hypertension Mother   . Mental illness Mother   . Hypertension Father   . Diabetes Father     type ll  . Heart disease Maternal Uncle   . Arthritis Maternal Grandmother   . Cancer Maternal Grandmother     bladder  . Cancer Son 17    osteosarcoma    History  Substance Use Topics  . Smoking status: Never Smoker   . Smokeless tobacco: Not on file  . Alcohol Use: No    OB History    Grav Para Term Preterm Abortions TAB SAB Ect Mult Living                  Review of Systems  Constitutional: Negative.   Respiratory: Negative for cough and shortness of breath.   Cardiovascular: Positive for leg swelling. Negative for chest pain.  Gastrointestinal: Negative.   Musculoskeletal: Positive for back pain.    Allergies  Review of patient's allergies indicates no known allergies.  Home Medications   Current Outpatient Rx  Name  Route  Sig  Dispense  Refill  . AMLODIPINE BESYLATE 5 MG PO TABS   Oral   Take 5 mg by mouth daily. 1/2 tab daily         . CETIRIZINE HCL 10 MG PO TABS   Oral   Take 10 mg by mouth daily.         Marland Kitchen  CLONIDINE HCL 0.2 MG PO TABS   Oral   Take 0.2 mg by mouth daily.          Marland Kitchen DOXYCYCLINE HYCLATE 100 MG PO TABS   Oral   Take 1 tablet (100 mg total) by mouth 2 (two) times daily.   14 tablet   0   . FUROSEMIDE 40 MG PO TABS   Oral   Take 1 tablet (40 mg total) by mouth daily. For leg swelling as needed.   30 tablet   0   . RILPIVIRINE HCL 25 MG PO TABS   Oral   Take 25 mg by mouth daily.         . TRIAMTERENE-HCTZ 37.5-25 MG PO CAPS      1/2 tab daily   30 capsule   5   . TRUVADA 200-300 MG PO TABS   Oral   Take 1 tablet by mouth daily. Per Dr Corine Shelter         . VALACYCLOVIR HCL 500 MG PO TABS   Oral   Take 500 mg by mouth daily.         Marland Kitchen  VENLAFAXINE HCL ER 75 MG PO CP24   Oral   Take 75 mg by mouth daily.           BP 125/82  Pulse 78  Temp 98.6 F (37 C) (Oral)  Resp 18  SpO2 100%  Physical Exam  Nursing note and vitals reviewed. Constitutional: She is oriented to person, place, and time. She appears well-developed and well-nourished. No distress.  HENT:  Head: Normocephalic.  Right Ear: External ear normal.  Left Ear: External ear normal.  Mouth/Throat: Oropharynx is clear and moist.  Eyes: Pupils are equal, round, and reactive to light.  Neck: Normal range of motion. Neck supple.  Cardiovascular: Normal rate, normal heart sounds and intact distal pulses.   Pulmonary/Chest: Effort normal and breath sounds normal. She has no rales.  Musculoskeletal: She exhibits edema. She exhibits no tenderness.  Neurological: She is alert and oriented to person, place, and time.  Skin: Skin is warm and dry.    ED Course  Procedures (including critical care time)  Labs Reviewed  POCT URINALYSIS DIP (DEVICE) - Abnormal; Notable for the following:    Hgb urine dipstick TRACE (*)     All other components within normal limits  POCT I-STAT, CHEM 8   No results found.   1. Edema of both legs       MDM  i-stat wnl.         Linna Hoff, MD 09/19/12 Mikle Bosworth

## 2012-09-19 NOTE — ED Notes (Signed)
Pt  Reports  Multiple  Symptoms   She  Reports  l  Sided  Back  Pain  Radiating  Down l  Leg  As   Well  As  Urinary  Frequency  And  Vaginal  Irritation  She  Also  States  She  Has  Pus  On  Her tonsils     She     Ambulated to  Room  With a  Fluid  Steady  Gait

## 2012-11-28 ENCOUNTER — Other Ambulatory Visit: Payer: Self-pay | Admitting: *Deleted

## 2012-11-28 MED ORDER — AMLODIPINE BESYLATE 5 MG PO TABS: ORAL_TABLET | ORAL | Status: DC

## 2012-12-06 ENCOUNTER — Other Ambulatory Visit: Payer: Self-pay | Admitting: *Deleted

## 2012-12-06 ENCOUNTER — Ambulatory Visit (INDEPENDENT_AMBULATORY_CARE_PROVIDER_SITE_OTHER): Payer: Self-pay | Admitting: Family Medicine

## 2012-12-06 ENCOUNTER — Encounter: Payer: Self-pay | Admitting: Family Medicine

## 2012-12-06 VITALS — BP 100/60 | Temp 98.7°F | Wt 290.0 lb

## 2012-12-06 DIAGNOSIS — M545 Low back pain, unspecified: Secondary | ICD-10-CM

## 2012-12-06 DIAGNOSIS — R300 Dysuria: Secondary | ICD-10-CM

## 2012-12-06 DIAGNOSIS — N898 Other specified noninflammatory disorders of vagina: Secondary | ICD-10-CM

## 2012-12-06 LAB — POCT URINALYSIS DIPSTICK
Bilirubin, UA: NEGATIVE
Glucose, UA: NEGATIVE
Ketones, UA: NEGATIVE
Leukocytes, UA: NEGATIVE
Nitrite, UA: NEGATIVE
Protein, UA: NEGATIVE
Spec Grav, UA: <=1.005
Urobilinogen, UA: 0.2
pH, UA: 6

## 2012-12-06 NOTE — Progress Notes (Signed)
  Subjective:    Patient ID: Kelli Stout, female    DOB: May 17, 1966, 47 y.o.   MRN: [id number]  HPI Acute visit  Patient seen with lumbar back pain mostly right-sided past few days. No specific injury. She was concerned about possible UTI but has had some frequency without burning. No radiculopathy symptoms. Pain is dull and improved slightly with ibuprofen. Mild severity. No lower extremity numbness or weakness.  Patient's had some thicker than usual vaginal discharge. Recent new sexual partner. Vaginal discharge has been somewhat yellow and slight odor. No fever. No abdominal /pelvic pain. Patient's had prior hysterectomy No skin rashes.  Past Medical History  Diagnosis Date  . Chicken pox   . Depression   . Hypertension   . UTI (urinary tract infection)    Past Surgical History  Procedure Laterality Date  . Abdominal hysterectomy  1989    partial    reports that she has never smoked. She does not have any smokeless tobacco history on file. She reports that she does not drink alcohol or use illicit drugs. family history includes Arthritis in her maternal grandmother; Cancer in her maternal grandmother; Cancer (age of onset: 60) in her son; Diabetes in her father; Heart disease in her maternal uncle and mother; Hyperlipidemia in her mother; Hypertension in her father and mother; and Mental illness in her mother. No Known Allergies    Review of Systems  Constitutional: Negative for fever and chills.  Genitourinary: Positive for dysuria, frequency and vaginal discharge. Negative for hematuria, vaginal bleeding, vaginal pain, menstrual problem and pelvic pain.  Musculoskeletal: Positive for back pain.  Neurological: Negative for weakness and numbness.       Objective:   Physical Exam  Constitutional: She appears well-developed and well-nourished.  Cardiovascular: Normal rate and regular rhythm.   Pulmonary/Chest: Effort normal and breath sounds normal. No  respiratory distress. She has no wheezes. She has no rales.  Genitourinary: Vaginal discharge found.  Patient's had prior hysterectomy. Vaginal mucosa appears normal. She has some relatively thin mucoid slightly yellowish discharge otherwise normal appearance  Musculoskeletal: She exhibits no edema.  Straight leg raise is negative  Neurological:  Full-strength lower extremities. Symmetric reflexes  Skin: No rash noted.          Assessment & Plan:  #1 vaginal discharge. Rule out bacterial vaginosis, trichomonas,  GC, or chlamydia. Vaginal probe is sent. #2 low back pain. Urinalysis unremarkable. No evidence for urinary tract infection. Continue ibuprofen as needed. Suspect soft tissue strain

## 2012-12-06 NOTE — Patient Instructions (Addendum)
Vaginitis  Vaginitis is an infection. It causes soreness, swelling, and redness (inflammation) of the vagina. Many of these infections are sexually transmitted diseases (STDs). Having unprotected sex can cause further problems and complications such as:   Chronic pelvic pain.   Infertility.   Unwanted pregnancy.   Abortion.   Tubal pregnancy.   Infection passed on to the newborn.   Cancer.  CAUSES    Monilia. This is a yeast or fungus infection, not an STD.   Bacterial vaginosis. The normal balance of bacteria in the vagina is disrupted and is replaced by an overgrowth of certain bacteria.   Gonorrhea, chlamydia. These are bacterial infections that are STDs.   Vaginal sponges, diaphragms, and intrauterine devices.   Trichomoniasis. This is a STD infection caused by a parasite.   Viruses like herpes and human papillomavirus. Both are STDs.   Pregnancy.   Immunosuppression. This occurs with certain conditions such as HIV infection or cancer.   Using bubble bath.   Taking certain antibiotic medicines.   Sporadic recurrence can occur if you become sick.   Diabetes.   Steroids.   Allergic reaction. If you have an allergy to:   Douches.   Soaps.   Spermicides.   Condoms.   Scented tampons or vaginal sprays.  SYMPTOMS    Abnormal vaginal discharge.   Itching of the vagina.   Pain in the vagina.   Swelling of the vagina.  In some cases, there are no symptoms.  TREATMENT   Treatment will vary depending on the type of infection.   Bacteria or trichomonas are usually treated with oral antibiotics and sometimes vaginal cream or suppositories.   Monilia vaginitis is usually treated with vaginal creams, suppositories, or oral antifungal pills.   Viral vaginitis has no cure. However, the symptoms of herpes (a viral vaginitis) can be treated to relieve the discomfort. Human papillomavirus has no symptoms. However, there are treatments for the diseases caused by human papillomavirus.   With allergic  vaginitis, you need to stop using the product that is causing the problem. Vaginal creams can be used to treat the symptoms.   When treating an STD, the sex partner should also be treated.  HOME CARE INSTRUCTIONS    Take all the medicines as directed by your caregiver.   Do not use scented tampons, soaps, or vaginal sprays.   Do not douche.   Tell your sex partner if you have a vaginal infection or an STD.   Do not have sexual intercourse until you have treated the vaginitis.   Practice safe sex by using condoms.  SEEK MEDICAL CARE IF:    You have abdominal pain.   Your symptoms get worse during treatment.  Document Released: 07/04/2007 Document Revised: 11/29/2011 Document Reviewed: 02/27/2009  [employer] Patient Information 2013 [employer]

## 2012-12-07 ENCOUNTER — Other Ambulatory Visit (HOSPITAL_COMMUNITY)
Admission: RE | Admit: 2012-12-07 | Discharge: 2012-12-07 | Disposition: A | Discharge status: Home / Self Care | Payer: Self-pay | Source: Ambulatory Visit | Attending: Family Medicine | Admitting: Family Medicine

## 2012-12-07 DIAGNOSIS — N760 Acute vaginitis: Secondary | ICD-10-CM | POA: Insufficient documentation

## 2012-12-07 DIAGNOSIS — Z113 Encounter for screening for infections with a predominantly sexual mode of transmission: Secondary | ICD-10-CM | POA: Insufficient documentation

## 2012-12-07 NOTE — Addendum Note (Signed)
Addended by: Bonnye Fava on: 12/07/2012 10:12 AM   Modules accepted: Orders

## 2012-12-07 NOTE — Addendum Note (Signed)
Addended by: Bonnye Fava on: 12/07/2012 09:57 AM   Modules accepted: Orders

## 2012-12-12 NOTE — Progress Notes (Signed)
Quick Note:  Pt informed on VM, "labs negative". Pt was anxious for results as she called yesterday and left a VM. ______

## 2012-12-12 NOTE — Progress Notes (Signed)
Resulted and pt informed today

## 2012-12-13 ENCOUNTER — Other Ambulatory Visit: Payer: Self-pay | Admitting: *Deleted

## 2012-12-13 MED ORDER — METRONIDAZOLE 500 MG PO TABS: 500.0000 mg | ORAL_TABLET | Freq: Two times a day (BID) | ORAL | Status: DC

## 2012-12-13 NOTE — Progress Notes (Signed)
Quick Note:  Pt informed, Rx called in ______

## 2013-03-08 ENCOUNTER — Other Ambulatory Visit: Payer: Self-pay | Admitting: Family Medicine

## 2013-06-27 ENCOUNTER — Ambulatory Visit (INDEPENDENT_AMBULATORY_CARE_PROVIDER_SITE_OTHER): Payer: 59 | Admitting: Family Medicine

## 2013-06-27 ENCOUNTER — Encounter: Payer: Self-pay | Admitting: Family Medicine

## 2013-06-27 VITALS — BP 130/70 | HR 90 | Temp 98.1°F | Wt 283.0 lb

## 2013-06-27 DIAGNOSIS — IMO0002 Reserved for concepts with insufficient information to code with codable children: Secondary | ICD-10-CM

## 2013-06-27 DIAGNOSIS — M5416 Radiculopathy, lumbar region: Secondary | ICD-10-CM

## 2013-06-27 MED ORDER — PREDNISONE 10 MG PO TABS: ORAL_TABLET | ORAL | Status: DC

## 2013-06-27 MED ORDER — TRAMADOL HCL 50 MG PO TABS: ORAL_TABLET | ORAL | Status: DC

## 2013-06-27 NOTE — Progress Notes (Signed)
  Subjective:    Patient ID: Kelli Stout, female    DOB: [birth year], 47 y.o.   MRN: [id number]  HPI  Acute visit Patient seen with lumbar back pain with radiation down left anterior thigh to approximately knee. She's had some numbness involving anterior thigh. No weakness. Pain is sharp and 10 out 10 severity at times. She has seen some improvement with ibuprofen. Denies any injury. Denies any recent fevers or chills. No urine or stool incontinence. No past history of any significant back difficulties  Past Medical History  Diagnosis Date  . Chicken pox   . Depression   . Hypertension   . UTI (urinary tract infection)    Past Surgical History  Procedure Laterality Date  . Abdominal hysterectomy  1989    partial    reports that she has never smoked. She does not have any smokeless tobacco history on file. She reports that she does not drink alcohol or use illicit drugs. family history includes Arthritis in her maternal grandmother; Cancer in her maternal grandmother; Cancer (age of onset: 48) in her son; Diabetes in her father; Heart disease in her maternal uncle and mother; Hyperlipidemia in her mother; Hypertension in her father and mother; Mental illness in her mother. No Known Allergies   Review of Systems  Constitutional: Negative for fever, chills and appetite change.  Respiratory: Negative for cough and shortness of breath.   Cardiovascular: Negative for chest pain.  Genitourinary: Negative for dysuria.  Musculoskeletal: Positive for back pain.  Neurological: Positive for numbness. Negative for weakness.  Hematological: Negative for adenopathy.       Objective:   Physical Exam  Constitutional: She appears well-developed and well-nourished.  Cardiovascular: Normal rate and regular rhythm.   Pulmonary/Chest: Effort normal and breath sounds normal. No respiratory distress. She has no wheezes. She has no rales.  Musculoskeletal: She exhibits no edema.  Straight  leg raise are negative No edema  Neurological:  Full-strength lower extremities. 2+ symmetric reflexes knee and ankle bilaterally          Assessment & Plan:  Left lumbar radiculopathy with nonfocal neurologic exam. Prednisone taper for the next week. Review possible side effects. Ultram 50 mg one to 2 every 6 hours as needed for pain. Touch base in 2 weeks if not improving

## 2013-06-27 NOTE — Patient Instructions (Signed)

## 2013-07-13 ENCOUNTER — Ambulatory Visit (INDEPENDENT_AMBULATORY_CARE_PROVIDER_SITE_OTHER): Payer: 59 | Admitting: Internal Medicine

## 2013-07-13 ENCOUNTER — Encounter: Payer: Self-pay | Admitting: Internal Medicine

## 2013-07-13 VITALS — BP 120/86 | HR 92 | Temp 98.4°F | Resp 20 | Wt 280.0 lb

## 2013-07-13 DIAGNOSIS — I10 Essential (primary) hypertension: Secondary | ICD-10-CM

## 2013-07-13 DIAGNOSIS — E669 Obesity, unspecified: Secondary | ICD-10-CM

## 2013-07-13 DIAGNOSIS — IMO0002 Reserved for concepts with insufficient information to code with codable children: Secondary | ICD-10-CM

## 2013-07-13 DIAGNOSIS — M5416 Radiculopathy, lumbar region: Secondary | ICD-10-CM | POA: Insufficient documentation

## 2013-07-13 MED ORDER — TRAMADOL HCL 50 MG PO TABS: ORAL_TABLET | ORAL | Status: DC

## 2013-07-13 MED ORDER — METHYLPREDNISOLONE ACETATE 80 MG/ML IJ SUSP
80.0000 mg | Freq: Once | INTRAMUSCULAR | Status: AC
  Administered 2013-07-13: 80 mg via INTRAMUSCULAR

## 2013-07-13 NOTE — Progress Notes (Signed)
Subjective:    Patient ID: Kelli Stout, female    DOB: Dec 09, 1965, 47 y.o.   MRN: [id number]  HPI  47 year old patient who was seen by her PCP 2 weeks ago with a left lumbar radiculopathy. She continues to have some mild pain in the left lumbar area and a chief complaint of pain involving her left anterior thigh. She also describes pain involving her left lateral knee. Symptoms have been present  now for approximately 5 weeks. She also describes some mild numbness involving her left hand. Denies any motor weakness. Denies any fever or other constitutional complaints.  Past Medical History  Diagnosis Date  . Chicken pox   . Depression   . Hypertension   . UTI (urinary tract infection)     History   Social History  . Marital Status: Single    Spouse Name: N/A    Number of Children: N/A  . Years of Education: N/A   Occupational History  . Not on file.   Social History Main Topics  . Smoking status: Never Smoker   . Smokeless tobacco: Not on file  . Alcohol Use: No  . Drug Use: No  . Sexual Activity: Not on file   Other Topics Concern  . Not on file   Social History Narrative  . No narrative on file    Past Surgical History  Procedure Laterality Date  . Abdominal hysterectomy  1989    partial    Family History  Problem Relation Age of Onset  . Hyperlipidemia Mother   . Heart disease Mother   . Hypertension Mother   . Mental illness Mother   . Hypertension Father   . Diabetes Father     type ll  . Heart disease Maternal Uncle   . Arthritis Maternal Grandmother   . Cancer Maternal Grandmother     bladder  . Cancer Son 17    osteosarcoma    No Known Allergies  Current Outpatient Prescriptions on File Prior to Visit  Medication Sig Dispense Refill  . amLODipine (NORVASC) 5 MG tablet Take 1/2 tab daily  45 tablet  3  . cetirizine (ZYRTEC) 10 MG tablet Take 10 mg by mouth daily.      . cloNIDine (CATAPRES) 0.2 MG tablet Take 0.2 mg by mouth daily.        . furosemide (LASIX) 40 MG tablet Take 1 tablet (40 mg total) by mouth daily. For leg swelling as needed.  30 tablet  0  . rilpivirine (ENDURANT) 25 MG TABS tablet Take 25 mg by mouth daily.      . traMADol (ULTRAM) 50 MG tablet 1-2 po q 6 hours prn pain  60 tablet  1  . triamterene-hydrochlorothiazide (DYAZIDE) 37.5-25 MG per capsule 1/2 tab daily  30 capsule  5  . TRUVADA 200-300 MG per tablet Take 1 tablet by mouth daily. Per Dr Corine Shelter      . valACYclovir (VALTREX) 500 MG tablet Take 500 mg by mouth daily.      Marland Kitchen venlafaxine (EFFEXOR-XR) 75 MG 24 hr capsule Take 75 mg by mouth daily.      Marland Kitchen venlafaxine XR (EFFEXOR-XR) 150 MG 24 hr capsule TAKE ONE CAPSULE BY MOUTH DAILY  90 capsule  0   No current facility-administered medications on file prior to visit.    BP 120/86  Pulse 92  Temp(Src) 98.4 F (36.9 C) (Oral)  Resp 20  Wt 280 lb (127.007 kg)  BMI 42.88 kg/m2  SpO2 98%  Review of Systems  Constitutional: Negative.   HENT: Negative for congestion, dental problem, hearing loss, rhinorrhea, sinus pressure, sore throat and tinnitus.   Eyes: Negative for pain, discharge and visual disturbance.  Respiratory: Negative for cough and shortness of breath.   Cardiovascular: Negative for chest pain, palpitations and leg swelling.  Gastrointestinal: Negative for nausea, vomiting, abdominal pain, diarrhea, constipation, blood in stool and abdominal distention.  Genitourinary: Negative for dysuria, urgency, frequency, hematuria, flank pain, vaginal bleeding, vaginal discharge, difficulty urinating, vaginal pain and pelvic pain.  Musculoskeletal: Positive for back pain. Negative for arthralgias, gait problem and joint swelling.  Skin: Negative for rash.  Neurological: Negative for dizziness, syncope, speech difficulty, weakness, numbness and headaches.  Hematological: Negative for adenopathy.  Psychiatric/Behavioral: Negative for behavioral problems, dysphoric mood and agitation. The  patient is not nervous/anxious.        Objective:   Physical Exam  Constitutional: She appears well-developed and well-nourished. No distress.  Musculoskeletal:  Straight leg test on the left reproduces some mild left lumbar discomfort Tenderness was noted involving her left lateral knee without inflammatory changes Patellar and Achilles reflexes were brisk No motor weakness          Assessment & Plan:  Suspect left lumbar radiculopathy. Distribution of left anterior thigh discomfort suggest L4 involvement. Will treat with Depo-Medrol and observe. We'll continue symptomatic treatment with tramadol Followup PCP if unimproved Hypertension stable

## 2013-07-13 NOTE — Patient Instructions (Signed)
Limit your sodium (Salt) intake  Most patients with low back pain will improve with time over the next two to 6 weeks.  Keep active but avoid any activities that cause pain.  Apply moist heat to the low back area several times daily.Back Injury Prevention Back injuries can be extremely painful and difficult to heal. After having one back injury, you are much more likely to experience another later on. It is important to learn how to avoid injuring or re-injuring your back. The following tips can help you to prevent a back injury. PHYSICAL FITNESS  Exercise regularly and try to develop good tone in your abdominal muscles. Your abdominal muscles provide a lot of the support needed by your back.  Do aerobic exercises (walking, jogging, biking, swimming) regularly.  Do exercises that increase balance and strength (tai chi, yoga) regularly. This can decrease your risk of falling and injuring your back.  Stretch before and after exercising.  Maintain a healthy weight. The more you weigh, the more stress is placed on your back. For every pound of weight, 10 times that amount of pressure is placed on the back. DIET  Talk to your caregiver about how much calcium and vitamin D you need per day. These nutrients help to prevent weakening of the bones (osteoporosis). Osteoporosis can cause broken (fractured) bones that lead to back pain.  Include good sources of calcium in your diet, such as dairy products, green, leafy vegetables, and products with calcium added (fortified).  Include good sources of vitamin D in your diet, such as milk and foods that are fortified with vitamin D.  Consider taking a nutritional supplement or a multivitamin if needed.  Stop smoking if you smoke. POSTURE  Sit and stand up straight. Avoid leaning forward when you sit or hunching over when you stand.  Choose chairs with good low back (lumbar) support.  If you work at a desk, sit close to your work so you do not need  to lean over. Keep your chin tucked in. Keep your neck drawn back and elbows bent at a right angle. Your arms should look like the letter "L."  Sit high and close to the steering wheel when you drive. Add a lumbar support to your car seat if needed.  Avoid sitting or standing in one position for too long. Take breaks to get up, stretch, and walk around at least once every hour. Take breaks if you are driving for long periods of time.  Sleep on your side with your knees slightly bent, or sleep on your back with a pillow under your knees. Do not sleep on your stomach. LIFTING, TWISTING, AND REACHING  Avoid heavy lifting, especially repetitive lifting. If you must do heavy lifting:  Stretch before lifting.  Work slowly.  Rest between lifts.  Use carts and dollies to move objects when possible.  Make several small trips instead of carrying 1 heavy load.  Ask for help when you need it.  Ask for help when moving big, awkward objects.  Follow these steps when lifting:  Stand with your feet shoulder-width apart.  Get as close to the object as you can. Do not try to pick up heavy objects that are far from your body.  Use handles or lifting straps if they are available.  Bend at your knees. Squat down, but keep your heels off the floor.  Keep your shoulders pulled back, your chin tucked in, and your back straight.  Lift the object slowly, tightening the  muscles in your legs, abdomen, and buttocks. Keep the object as close to the center of your body as possible.  When you put a load down, use these same guidelines in reverse.  Do not:  Lift the object above your waist.  Twist at the waist while lifting or carrying a load. Move your feet if you need to turn, not your waist.  Bend over without bending at your knees.  Avoid reaching over your head, across a table, or for an object on a high surface. OTHER TIPS  Avoid wet floors and keep sidewalks clear of ice to prevent  falls.  Do not sleep on a mattress that is too soft or too hard.  Keep items that are used frequently within easy reach.  Put heavier objects on shelves at waist level and lighter objects on lower or higher shelves.  Find ways to decrease your stress, such as exercise, massage, or relaxation techniques. Stress can build up in your muscles. Tense muscles are more vulnerable to injury.  Seek treatment for depression or anxiety if needed. These conditions can increase your risk of developing back pain. SEEK MEDICAL CARE IF:  You injure your back.  You have questions about diet, exercise, or other ways to prevent back injuries. MAKE SURE YOU:  Understand these instructions.  Will watch your condition.  Will get help right away if you are not doing well or get worse. Document Released: 10/14/2004 Document Revised: 11/29/2011 Document Reviewed: 10/18/2011 [facility] Patient Information 2014 [place], [place]. Sciatica Sciatica is pain, weakness, numbness, or tingling along the path of the sciatic nerve. The nerve starts in the lower back and runs down the back of each leg. The nerve controls the muscles in the lower leg and in the back of the knee, while also providing sensation to the back of the thigh, lower leg, and the sole of your foot. Sciatica is a symptom of another medical condition. For instance, nerve damage or certain conditions, such as a herniated disk or bone spur on the spine, pinch or put pressure on the sciatic nerve. This causes the pain, weakness, or other sensations normally associated with sciatica. Generally, sciatica only affects one side of the body. CAUSES   Herniated or slipped disc.  Degenerative disk disease.  A pain disorder involving the narrow muscle in the buttocks (piriformis syndrome).  Pelvic injury or fracture.  Pregnancy.  Tumor (rare). SYMPTOMS  Symptoms can vary from mild to very severe. The symptoms usually travel from the low back to the  buttocks and down the back of the leg. Symptoms can include:  Mild tingling or dull aches in the lower back, leg, or hip.  Numbness in the back of the calf or sole of the foot.  Burning sensations in the lower back, leg, or hip.  Sharp pains in the lower back, leg, or hip.  Leg weakness.  Severe back pain inhibiting movement. These symptoms may get worse with coughing, sneezing, laughing, or prolonged sitting or standing. Also, being overweight may worsen symptoms. DIAGNOSIS  Your caregiver will perform a physical exam to look for common symptoms of sciatica. He or she may ask you to do certain movements or activities that would trigger sciatic nerve pain. Other tests may be performed to find the cause of the sciatica. These may include:  Blood tests.  X-rays.  Imaging tests, such as an MRI or CT scan. TREATMENT  Treatment is directed at the cause of the sciatic pain. Sometimes, treatment is not necessary  and the pain and discomfort goes away on its own. If treatment is needed, your caregiver may suggest:  Over-the-counter medicines to relieve pain.  Prescription medicines, such as anti-inflammatory medicine, muscle relaxants, or narcotics.  Applying heat or ice to the painful area.  Steroid injections to lessen pain, irritation, and inflammation around the nerve.  Reducing activity during periods of pain.  Exercising and stretching to strengthen your abdomen and improve flexibility of your spine. Your caregiver may suggest losing weight if the extra weight makes the back pain worse.  Physical therapy.  Surgery to eliminate what is pressing or pinching the nerve, such as a bone spur or part of a herniated disk. HOME CARE INSTRUCTIONS   Only take over-the-counter or prescription medicines for pain or discomfort as directed by your caregiver.  Apply ice to the affected area for 20 minutes, 3 4 times a day for the first 48 72 hours. Then try heat in the same way.  Exercise,  stretch, or perform your usual activities if these do not aggravate your pain.  Attend physical therapy sessions as directed by your caregiver.  Keep all follow-up appointments as directed by your caregiver.  Do not wear high heels or shoes that do not provide proper support.  Check your mattress to see if it is too soft. A firm mattress may lessen your pain and discomfort. SEEK IMMEDIATE MEDICAL CARE IF:   You lose control of your bowel or bladder (incontinence).  You have increasing weakness in the lower back, pelvis, buttocks, or legs.  You have redness or swelling of your back.  You have a burning sensation when you urinate.  You have pain that gets worse when you lie down or awakens you at night.  Your pain is worse than you have experienced in the past.  Your pain is lasting longer than 4 weeks.  You are suddenly losing weight without reason. MAKE SURE YOU:  Understand these instructions.  Will watch your condition.  Will get help right away if you are not doing well or get worse. Document Released: 08/31/2001 Document Revised: 03/07/2012 Document Reviewed: 01/16/2012 [facility] Patient Information 2014 [place], [place].

## 2013-09-06 ENCOUNTER — Other Ambulatory Visit: Payer: Self-pay | Admitting: Family Medicine

## 2013-10-23 ENCOUNTER — Encounter: Payer: Self-pay | Admitting: Family Medicine

## 2013-10-23 ENCOUNTER — Ambulatory Visit (INDEPENDENT_AMBULATORY_CARE_PROVIDER_SITE_OTHER): Payer: 59 | Admitting: Family Medicine

## 2013-10-23 VITALS — BP 126/70 | HR 104 | Temp 98.6°F | Ht 67.75 in | Wt 268.0 lb

## 2013-10-23 DIAGNOSIS — J019 Acute sinusitis, unspecified: Secondary | ICD-10-CM

## 2013-10-23 MED ORDER — AZITHROMYCIN 250 MG PO TABS: ORAL_TABLET | ORAL | Status: DC

## 2013-10-23 MED ORDER — HYDROCODONE-HOMATROPINE 5-1.5 MG/5ML PO SYRP: 5.0000 mL | ORAL_SOLUTION | ORAL | Status: DC | PRN

## 2013-10-23 NOTE — Progress Notes (Signed)
   Subjective:    Patient ID: Kelli Stout, female    DOB: August 02, 1966, 48 y.o.   MRN: [id number]  HPI Here for 5 days of sinus pressure, PND, and coughing up yellow sputum. On Mucinex.    Review of Systems  Constitutional: Negative.   HENT: Positive for congestion, postnasal drip and sinus pressure.   Eyes: Negative.   Respiratory: Positive for cough.        Objective:   Physical Exam  Constitutional: She appears well-developed and well-nourished.  HENT:  Right Ear: External ear normal.  Left Ear: External ear normal.  Nose: Nose normal.  Mouth/Throat: Oropharynx is clear and moist.  Eyes: Conjunctivae are normal.  Pulmonary/Chest: Effort normal and breath sounds normal.  Lymphadenopathy:    She has no cervical adenopathy.          Assessment & Plan:  Written out of work yesterday and today .

## 2013-10-23 NOTE — Progress Notes (Signed)
Pre visit review using our clinic review tool, if applicable. No additional management support is needed unless otherwise documented below in the visit note. 

## 2013-11-08 ENCOUNTER — Ambulatory Visit (INDEPENDENT_AMBULATORY_CARE_PROVIDER_SITE_OTHER): Payer: 59 | Admitting: Family Medicine

## 2013-11-08 ENCOUNTER — Ambulatory Visit (INDEPENDENT_AMBULATORY_CARE_PROVIDER_SITE_OTHER)
Admission: RE | Admit: 2013-11-08 | Discharge: 2013-11-08 | Disposition: A | Discharge status: Home / Self Care | Payer: 59 | Source: Ambulatory Visit | Attending: Family Medicine | Admitting: Family Medicine

## 2013-11-08 ENCOUNTER — Encounter: Payer: Self-pay | Admitting: Family Medicine

## 2013-11-08 VITALS — BP 126/80 | HR 91 | Temp 98.0°F | Wt 272.0 lb

## 2013-11-08 DIAGNOSIS — R059 Cough, unspecified: Secondary | ICD-10-CM

## 2013-11-08 DIAGNOSIS — R0600 Dyspnea, unspecified: Secondary | ICD-10-CM

## 2013-11-08 DIAGNOSIS — R05 Cough: Secondary | ICD-10-CM

## 2013-11-08 DIAGNOSIS — R0989 Other specified symptoms and signs involving the circulatory and respiratory systems: Secondary | ICD-10-CM

## 2013-11-08 DIAGNOSIS — R0609 Other forms of dyspnea: Secondary | ICD-10-CM

## 2013-11-08 MED ORDER — BENZONATATE 200 MG PO CAPS: 200.0000 mg | ORAL_CAPSULE | Freq: Three times a day (TID) | ORAL | Status: DC | PRN

## 2013-11-08 NOTE — Patient Instructions (Signed)
Follow up promptly for any fever or increasing shortness of breath. 

## 2013-11-08 NOTE — Progress Notes (Signed)
Pre visit review using our clinic review tool, if applicable. No additional management support is needed unless otherwise documented below in the visit note. 

## 2013-11-08 NOTE — Progress Notes (Signed)
   Subjective:    Patient ID: Kelli Stout, female    DOB: [birth year], 48 y.o.   MRN: [id number]  Cough Associated symptoms include shortness of breath. Pertinent negatives include no chest pain, chills, fever, sore throat or wheezing.   Patient seen with persistent cough and some shortness of breath following URI with onset around late January. She was seen here on February 3 and diagnosed with sinusitis and treated with Zithromax and Hycodan cough syrup. Patient recalls that she was cleaning with some bleach on the day that she started coughing. This occurred at work. At this point, she has cough which is mostly dry occasionally productive of clear mucus. She has noted some dyspnea with activity but not at rest. No orthopnea. No chest pains. No peripheral edema. She denies any fever. No wheezing. No GERD. No postnasal drip symptoms. Nonsmoker. No pleuritic pain and no hemoptysis.  Hycodan cough syrup does help relieve her cough. No clear exacerbating factors.  Past Medical History  Diagnosis Date  . Chicken pox   . Depression   . Hypertension   . UTI (urinary tract infection)    Past Surgical History  Procedure Laterality Date  . Abdominal hysterectomy  1989    partial    reports that she has never smoked. She has never used smokeless tobacco. She reports that she does not drink alcohol or use illicit drugs. family history includes Arthritis in her maternal grandmother; Cancer in her maternal grandmother; Cancer (age of onset: 5017) in her son; Diabetes in her father; Heart disease in her maternal uncle and mother; Hyperlipidemia in her mother; Hypertension in her father and mother; Mental illness in her mother. No Known Allergies    Review of Systems  Constitutional: Negative for fever, chills, appetite change and unexpected weight change.  HENT: Negative for congestion and sore throat.   Respiratory: Positive for cough and shortness of breath. Negative for wheezing.     Cardiovascular: Negative for chest pain, palpitations and leg swelling.  Gastrointestinal: Negative for abdominal pain.  Neurological: Negative for dizziness and syncope.  Hematological: Negative for adenopathy.       Objective:   Physical Exam  Vitals reviewed. Constitutional: She appears well-developed and well-nourished.  HENT:  Mouth/Throat: Oropharynx is clear and moist.  Neck: Neck supple. No JVD present. No thyromegaly present.  Cardiovascular: Normal rate.  Exam reveals no gallop.   No murmur heard. Pulmonary/Chest: Effort normal and breath sounds normal. No respiratory distress. She has no wheezes. She has no rales.  Musculoskeletal: She exhibits no edema.  Lymphadenopathy:    She has no cervical adenopathy.          Assessment & Plan:  Patient presents with persistent cough and some associated dyspnea. Nonfocal exam. No obvious wheezing. She has what sounds like recent viral upper respiratory infection. Given symptoms of dyspnea, set up chest x-ray. If normal, treat cough symptomatically with Tessalon Perles 200 mg every 8 hours as needed.

## 2013-11-18 ENCOUNTER — Other Ambulatory Visit: Payer: Self-pay | Admitting: Family Medicine

## 2014-01-06 ENCOUNTER — Other Ambulatory Visit: Payer: Self-pay | Admitting: Family Medicine

## 2014-01-10 ENCOUNTER — Encounter: Payer: Self-pay | Admitting: Internal Medicine

## 2014-02-21 ENCOUNTER — Ambulatory Visit (AMBULATORY_SURGERY_CENTER): Payer: 59 | Admitting: *Deleted

## 2014-02-21 VITALS — Ht 68.0 in | Wt 264.0 lb

## 2014-02-21 DIAGNOSIS — Z1211 Encounter for screening for malignant neoplasm of colon: Secondary | ICD-10-CM

## 2014-02-21 MED ORDER — MOVIPREP 100 G PO SOLR: ORAL | Status: DC

## 2014-02-21 NOTE — Progress Notes (Signed)
No allergies to eggs or soy. No problems with anesthesia.  Pt given Emmi instructions for colonoscopy  No oxygen use  No diet drug use  

## 2014-02-28 ENCOUNTER — Ambulatory Visit: Payer: Self-pay | Admitting: Podiatry

## 2014-03-07 ENCOUNTER — Encounter: Payer: Self-pay | Admitting: Internal Medicine

## 2014-03-07 ENCOUNTER — Ambulatory Visit (AMBULATORY_SURGERY_CENTER): Payer: 59 | Admitting: Internal Medicine

## 2014-03-07 VITALS — BP 137/56 | HR 78 | Resp 16

## 2014-03-07 DIAGNOSIS — Z1211 Encounter for screening for malignant neoplasm of colon: Secondary | ICD-10-CM

## 2014-03-07 MED ORDER — FLEET ENEMA 7-19 GM/118ML RE ENEM
1.0000 | ENEMA | Freq: Once | RECTAL | Status: AC
  Administered 2014-03-07: 1 via RECTAL

## 2014-03-07 MED ORDER — DISPOSABLE ENEMA 19-7 GM/118ML RE ENEM
1.0000 | ENEMA | Freq: Once | RECTAL | Status: AC
  Administered 2014-03-07: 1 via RECTAL

## 2014-03-07 MED ORDER — SODIUM CHLORIDE 0.9 % IV SOLN: 500.0000 mL | INTRAVENOUS | Status: DC

## 2014-03-07 NOTE — Progress Notes (Signed)
Report to PACU, RN, vss, BBS= Clear.  

## 2014-03-07 NOTE — Op Note (Signed)
[facility] [address] [place], [id number]   COLONOSCOPY PROCEDURE REPORT  PATIENT: Kelli Stout, Kelli H.  MR#: [id number] BIRTHDATE: 11/10/1965 , 48  yrs. old GENDER: Female ENDOSCOPIST: Beverley FiedlerJay M Pyrtle, MD REFERRED VO:ZDGUYBY:Bruce Caryl NeverBurchette, M.D. PROCEDURE DATE:  03/07/2014 PROCEDURE:   Colonoscopy, screening First Screening Colonoscopy - Avg.  risk and is 50 yrs.  old or older Yes.  Prior Negative Screening - Now for repeat screening. N/A  History of Adenoma - Now for follow-up colonoscopy & has been > or = to 3 yrs.  N/A  Polyps Removed Today? Yes. ASA CLASS:   Class III INDICATIONS:average risk screening and first colonoscopy. MEDICATIONS: MAC sedation, administered by CRNA and Propofol (Diprivan) 260 mg IV  DESCRIPTION OF PROCEDURE:   After the risks benefits and alternatives of the procedure were thoroughly explained, informed consent was obtained.  A digital rectal exam revealed no rectal mass.   The LB PFC-H190 [id number] and LB PFC-H190 [id number]  endoscope was introduced through the anus and advanced to the cecum, which was identified by both the appendix and ileocecal valve. No adverse events experienced.   The quality of the prep was Moviprep fair requiring copious irrigation and lavage. The instrument was then slowly withdrawn as the colon was fully examined.    COLON FINDINGS: A normal appearing cecum, ileocecal valve, and appendiceal orifice were identified.  The ascending, hepatic flexure, transverse, splenic flexure, descending, sigmoid colon and rectum appeared unremarkable.  No polyps or cancers were seen. Retroflexed views revealed no abnormalities. The time to cecum=3 minutes 03 seconds.  Withdrawal time=11 minutes 33 seconds.  The scope was withdrawn and the procedure completed.  COMPLICATIONS: There were no complications.  ENDOSCOPIC IMPRESSION: Normal colon  RECOMMENDATIONS: You should continue to follow colorectal cancer screening  guidelines for "routine risk" patients with a repeat colonoscopy in 10 years. There is no need for FOBT (stool) testing for at least 5 years.   eSigned:  Beverley FiedlerJay M Pyrtle, MD 03/07/2014 9:37 AM   cc: The Patient and Evelena PeatBruce Burchette, MD

## 2014-03-07 NOTE — Patient Instructions (Signed)
YOU HAD AN ENDOSCOPIC PROCEDURE TODAY AT THE [facility]: Refer to the procedure report that was given to you for any specific questions about what was found during the examination.  If the procedure report does not answer your questions, please call your gastroenterologist to clarify.  If you requested that your care partner not be given the details of your procedure findings, then the procedure report has been included in a sealed envelope for you to review at your convenience later.  YOU SHOULD EXPECT: Some feelings of bloating in the abdomen. Passage of more gas than usual.  Walking can help get rid of the air that was put into your GI tract during the procedure and reduce the bloating. If you had a lower endoscopy (such as a colonoscopy or flexible sigmoidoscopy) you may notice spotting of blood in your stool or on the toilet paper. If you underwent a bowel prep for your procedure, then you may not have a normal bowel movement for a few days.  DIET: Your first meal following the procedure should be a light meal and then it is ok to progress to your normal diet.  A half-sandwich or bowl of soup is an example of a good first meal.  Heavy or fried foods are harder to digest and may make you feel nauseous or bloated.  Likewise meals heavy in dairy and vegetables can cause extra gas to form and this can also increase the bloating.  Drink plenty of fluids but you should avoid alcoholic beverages for 24 hours.  ACTIVITY: Your care partner should take you home directly after the procedure.  You should plan to take it easy, moving slowly for the rest of the day.  You can resume normal activity the day after the procedure however you should NOT DRIVE or use heavy machinery for 24 hours (because of the sedation medicines used during the test).    SYMPTOMS TO REPORT IMMEDIATELY: A gastroenterologist can be reached at any hour.  During normal business hours, 8:30 AM to 5:00 PM Monday through Friday,  call [phone].  After hours and on weekends, please call the GI answering service at [phone] who will take a message and have the physician on call contact you.   Following lower endoscopy (colonoscopy or flexible sigmoidoscopy):  Excessive amounts of blood in the stool  Significant tenderness or worsening of abdominal pains  Swelling of the abdomen that is new, acute  Fever of 100F or higher    FOLLOW UP: If any biopsies were taken you will be contacted by phone or by letter within the next 1-3 weeks.  Call your gastroenterologist if you have not heard about the biopsies in 3 weeks.  Our staff will call the home number listed on your records the next business day following your procedure to check on you and address any questions or concerns that you may have at that time regarding the information given to you following your procedure. This is a courtesy call and so if there is no answer at the home number and we have not heard from you through the emergency physician on call, we will assume that you have returned to your regular daily activities without incident.  SIGNATURES/CONFIDENTIALITY: You and/or your care partner have signed paperwork which will be entered into your electronic medical record.  These signatures attest to the fact that that the information above on your After Visit Summary has been reviewed and is understood.  Full responsibility of the confidentiality   of this discharge information lies with you and/or your care-partner.     

## 2014-03-11 ENCOUNTER — Telehealth: Payer: Self-pay

## 2014-03-11 NOTE — Telephone Encounter (Signed)
  Follow up Call-  Call back number 03/07/2014  Post procedure Call Back phone  # [phone]  Permission to leave phone message Yes     Patient questions:  Do you have a fever, pain , or abdominal swelling? no Pain Score  0 *  Have you tolerated food without any problems? yes  Have you been able to return to your normal activities? yes  Do you have any questions about your discharge instructions: Diet   no Medications  no Follow up visit  no  Do you have questions or concerns about your Care? no  Actions: * If pain score is 4 or above: No action needed, pain <4.

## 2014-05-09 ENCOUNTER — Ambulatory Visit: Payer: 59 | Admitting: Family Medicine

## 2014-05-14 ENCOUNTER — Encounter: Payer: Self-pay | Admitting: Family Medicine

## 2014-05-14 ENCOUNTER — Ambulatory Visit (INDEPENDENT_AMBULATORY_CARE_PROVIDER_SITE_OTHER): Payer: 59 | Admitting: Family Medicine

## 2014-05-14 VITALS — BP 124/78 | HR 86 | Temp 97.6°F | Wt 261.0 lb

## 2014-05-14 DIAGNOSIS — R21 Rash and other nonspecific skin eruption: Secondary | ICD-10-CM

## 2014-05-14 MED ORDER — TRIAMCINOLONE ACETONIDE 0.1 % EX CREA: 1.0000 "application " | TOPICAL_CREAM | Freq: Two times a day (BID) | CUTANEOUS | Status: DC | PRN

## 2014-05-14 NOTE — Progress Notes (Signed)
Pre visit review using our clinic review tool, if applicable. No additional management support is needed unless otherwise documented below in the visit note. 

## 2014-05-14 NOTE — Progress Notes (Signed)
   Subjective:    Patient ID: Kelli Stout, female    DOB: 11-26-1965, 48 y.o.   MRN: [id number]  Rash Pertinent negatives include no fever or sore throat.   Patient seen with pruritic skin rash. Onset about a week ago. She's been scratching this frequently. She tried some over-the-counter hydrocortisone cream without improvement. Exacerbated by heat. No history of similar rash. Sparing of extremities. Denies any associated pain. No fevers or chills. She does have history of HIV and is followed over at [facility]. No recent change of medication.  Past Medical History  Diagnosis Date  . Chicken pox   . Depression   . Hypertension   . UTI (urinary tract infection)   . Anxiety   . HIV infection    Past Surgical History  Procedure Laterality Date  . Abdominal hysterectomy  1989    partial    reports that she has never smoked. She has never used smokeless tobacco. She reports that she does not drink alcohol or use illicit drugs. family history includes Arthritis in her maternal grandmother; Cancer in her maternal grandmother; Cancer (age of onset: 38) in her son; Colon cancer (age of onset: 60) in her maternal aunt; Diabetes in her father; Heart disease in her maternal uncle and mother; Hyperlipidemia in her mother; Hypertension in her father and mother; Mental illness in her mother. No Known Allergies    Review of Systems  Constitutional: Negative for fever and chills.  HENT: Negative for sore throat.   Skin: Positive for rash.  Hematological: Negative for adenopathy.       Objective:   Physical Exam  Constitutional: She appears well-developed and well-nourished.  Cardiovascular: Normal rate and regular rhythm.   Pulmonary/Chest: Effort normal and breath sounds normal. No respiratory distress. She has no wheezes. She has no rales.  Skin: Rash noted.  Patient has a few nonspecific follicular lesions back area mostly thoracic region. Some of these are slightly scabbed over  from recent scratching. No pustules. No vesicles. Nontender.          Assessment & Plan:  Nonspecific pruritic follicular rash thoracic back area. Avoid scratching. Triamcinolone 0.1% cream twice daily as needed. Followup when necessary

## 2014-06-06 ENCOUNTER — Encounter: Payer: Self-pay | Admitting: Family Medicine

## 2014-06-06 ENCOUNTER — Ambulatory Visit (INDEPENDENT_AMBULATORY_CARE_PROVIDER_SITE_OTHER): Payer: 59 | Admitting: Family Medicine

## 2014-06-06 VITALS — BP 130/80 | HR 84 | Temp 98.0°F | Wt 255.0 lb

## 2014-06-06 DIAGNOSIS — L282 Other prurigo: Secondary | ICD-10-CM

## 2014-06-06 MED ORDER — HYDROXYZINE HCL 25 MG PO TABS: 25.0000 mg | ORAL_TABLET | Freq: Three times a day (TID) | ORAL | Status: DC | PRN

## 2014-06-06 MED ORDER — TRIAMCINOLONE ACETONIDE 0.1 % EX CREA: 1.0000 "application " | TOPICAL_CREAM | Freq: Two times a day (BID) | CUTANEOUS | Status: DC

## 2014-06-06 NOTE — Progress Notes (Signed)
Pre visit review using our clinic review tool, if applicable. No additional management support is needed unless otherwise documented below in the visit note. 

## 2014-06-06 NOTE — Progress Notes (Signed)
   Subjective:    Patient ID: Kelli Stout, female    DOB: May 04, 1966, 48 y.o.   MRN: [id number]  Rash Pertinent negatives include no fatigue, fever or sore throat.   Patient seen with persistent rash. Refer to recent note:  05/14/14 "Patient seen with pruritic skin rash. Onset about a week ago. She's been scratching this frequently. She tried some over-the-counter hydrocortisone cream without improvement. Exacerbated by heat. No history of similar rash. Sparing of extremities. Denies any associated pain. No fevers or chills. She does have history of HIV and is followed over at [name]. No recent change of medication."   She tried some triamcinolone cream without improvement.  Uses Dial soap.  Scratching frequently. Itching worse at night.  No new areas of rash-largely confined to trunk and upper thighs.  Past Medical History  Diagnosis Date  . Chicken pox   . Depression   . Hypertension   . UTI (urinary tract infection)   . Anxiety   . HIV infection    Past Surgical History  Procedure Laterality Date  . Abdominal hysterectomy  1989    partial    reports that she has never smoked. She has never used smokeless tobacco. She reports that she does not drink alcohol or use illicit drugs. family history includes Arthritis in her maternal grandmother; Cancer in her maternal grandmother; Cancer (age of onset: 82) in her son; Colon cancer (age of onset: 57) in her maternal aunt; Diabetes in her father; Heart disease in her maternal uncle and mother; Hyperlipidemia in her mother; Hypertension in her father and mother; Mental illness in her mother. No Known Allergies      Review of Systems  Constitutional: Negative for fever, chills and fatigue.  HENT: Negative for sore throat.   Skin: Positive for rash.  Hematological: Negative for adenopathy.       Objective:   Physical Exam  Constitutional: She appears well-developed and well-nourished.  Cardiovascular: Normal rate and  regular rhythm.   Pulmonary/Chest: Effort normal and breath sounds normal. No respiratory distress. She has no wheezes. She has no rales.  Skin: Rash noted.  Patient has nonspecific dry excoriated patches of skin mostly on her trunk region. No pustules. No vesicles.          Assessment & Plan:  Pruritic skin rash. Her skin is very dry. This appears more eczematous. Avoid scratching. Stop dial soap and use milder soap such as Dove. Avoid prolonged bathing. Hydroxyzine 25 mg one to 2 at night as needed. Compound triamcinolone 0.1% cream with Eucerin 1-1 and use once or twice daily. Dermatology referral if not improving over the next month

## 2014-06-06 NOTE — Patient Instructions (Signed)
Avoid Dial soap Consider milder soap such as Dove Avoid scratching as much as possible

## 2014-08-29 ENCOUNTER — Ambulatory Visit: Payer: 59 | Admitting: Family Medicine

## 2014-10-02 ENCOUNTER — Ambulatory Visit (INDEPENDENT_AMBULATORY_CARE_PROVIDER_SITE_OTHER): Payer: 59 | Admitting: Family Medicine

## 2014-10-02 ENCOUNTER — Encounter: Payer: Self-pay | Admitting: Family Medicine

## 2014-10-02 VITALS — BP 128/78 | HR 90 | Temp 97.3°F | Wt 259.0 lb

## 2014-10-02 DIAGNOSIS — J0100 Acute maxillary sinusitis, unspecified: Secondary | ICD-10-CM

## 2014-10-02 MED ORDER — AMOXICILLIN 875 MG PO TABS: 875.0000 mg | ORAL_TABLET | Freq: Two times a day (BID) | ORAL | Status: DC

## 2014-10-02 NOTE — Patient Instructions (Signed)

## 2014-10-02 NOTE — Progress Notes (Signed)
Pre visit review using our clinic review tool, if applicable. No additional management support is needed unless otherwise documented below in the visit note. 

## 2014-10-02 NOTE — Progress Notes (Signed)
   Subjective:    Patient ID: Kelli Stout, female    DOB: 25-Sep-1965, 49 y.o.   MRN: [id number]  HPI Patient seen with over one week history of sinus congestion, headaches, malaise, facial pressure. Denies any fever or chills. No cough. She's had pansinusitis symptoms. She feels symptoms are getting worse instead of better. She's had some slight nausea but no vomiting  Past Medical History  Diagnosis Date  . Chicken pox   . Depression   . Hypertension   . UTI (urinary tract infection)   . Anxiety   . HIV infection    Past Surgical History  Procedure Laterality Date  . Abdominal hysterectomy  1989    partial    reports that she has never smoked. She has never used smokeless tobacco. She reports that she does not drink alcohol or use illicit drugs. family history includes Arthritis in her maternal grandmother; Cancer in her maternal grandmother; Cancer (age of onset: 6617) in her son; Colon cancer (age of onset: 4870) in her maternal aunt; Diabetes in her father; Heart disease in her maternal uncle and mother; Hyperlipidemia in her mother; Hypertension in her father and mother; Mental illness in her mother. No Known Allergies    Review of Systems  Constitutional: Positive for fatigue. Negative for fever and chills.  HENT: Positive for congestion. Negative for sore throat.   Respiratory: Negative for cough.   Neurological: Positive for headaches.       Objective:   Physical Exam  Constitutional: She appears well-developed and well-nourished.  HENT:  Right Ear: External ear normal.  Left Ear: External ear normal.  Tonsilliths right tonsil, otherwise clear  Neck: Neck supple.  Cardiovascular: Normal rate and regular rhythm.   Pulmonary/Chest: Effort normal and breath sounds normal. No respiratory distress. She has no wheezes. She has no rales.  Lymphadenopathy:    She has no cervical adenopathy.          Assessment & Plan:  Acute sinusitis. We explained most sinus  infections are viral. She feels symptoms are getting worse and not better. Amoxicillin 875 mg twice a day for 10 days. Follow-up as needed

## 2014-11-22 ENCOUNTER — Ambulatory Visit (INDEPENDENT_AMBULATORY_CARE_PROVIDER_SITE_OTHER): Payer: 59 | Admitting: Family Medicine

## 2014-11-22 ENCOUNTER — Encounter: Payer: Self-pay | Admitting: Family Medicine

## 2014-11-22 VITALS — BP 130/78 | HR 90 | Temp 99.4°F | Wt 259.0 lb

## 2014-11-22 DIAGNOSIS — R6889 Other general symptoms and signs: Secondary | ICD-10-CM

## 2014-11-22 DIAGNOSIS — B349 Viral infection, unspecified: Secondary | ICD-10-CM

## 2014-11-22 LAB — POCT INFLUENZA A/B
Influenza A, POC: NEGATIVE
Influenza B, POC: NEGATIVE

## 2014-11-22 NOTE — Patient Instructions (Signed)

## 2014-11-22 NOTE — Progress Notes (Signed)
Pre visit review using our clinic review tool, if applicable. No additional management support is needed unless otherwise documented below in the visit note. 

## 2014-11-22 NOTE — Progress Notes (Signed)
   Subjective:    Patient ID: Kelli Stout, female    DOB: 03-19-1966, 49 y.o.   MRN: [id number]  HPI  Patient seen for acute visit. Onset Monday of body aches, chills, low-grade fever of 100 to 101, sinus pressure, intermittent headaches. She's had some nausea intermittently. She's also had occasional loose stools. She did have flu vaccine earlier this year. She has history of [facility] and stable.. She feels slightly better today than couple days ago. Has been able to keep down fluids.  Past Medical History  Diagnosis Date  . Chicken pox   . Depression   . Hypertension   . UTI (urinary tract infection)   . Anxiety   . HIV infection    Past Surgical History  Procedure Laterality Date  . Abdominal hysterectomy  1989    partial    reports that she has never smoked. She has never used smokeless tobacco. She reports that she does not drink alcohol or use illicit drugs. family history includes Arthritis in her maternal grandmother; Cancer in her maternal grandmother; Cancer (age of onset: 3517) in her son; Colon cancer (age of onset: 3770) in her maternal aunt; Diabetes in her father; Heart disease in her maternal uncle and mother; Hyperlipidemia in her mother; Hypertension in her father and mother; Mental illness in her mother. No Known Allergies   Review of Systems  Constitutional: Positive for fever, chills and fatigue.  HENT: Positive for congestion and sinus pressure.   Respiratory: Negative for cough.   Gastrointestinal: Negative for abdominal pain.  Neurological: Positive for headaches.       Objective:   Physical Exam  Constitutional: She appears well-developed and well-nourished.  HENT:  Right Ear: External ear normal.  Left Ear: External ear normal.  Mouth/Throat: Oropharynx is clear and moist.  Neck: Neck supple.  Cardiovascular: Normal rate and regular rhythm.   Pulmonary/Chest: Effort normal and breath sounds normal. No respiratory distress.  She has no wheezes. She has no rales.  Lymphadenopathy:    She has no cervical adenopathy.          Assessment & Plan:  Probable viral syndrome. Rule out influenza. She did have flu vaccine. If negative, treat symptomatically Flu screen negative.

## 2015-01-03 ENCOUNTER — Other Ambulatory Visit: Payer: Self-pay | Admitting: Family Medicine

## 2015-02-06 ENCOUNTER — Ambulatory Visit: Payer: 59 | Admitting: Family Medicine

## 2015-02-06 DIAGNOSIS — Z0289 Encounter for other administrative examinations: Secondary | ICD-10-CM

## 2015-03-03 ENCOUNTER — Encounter: Payer: Self-pay | Admitting: Family Medicine

## 2015-03-03 ENCOUNTER — Ambulatory Visit (INDEPENDENT_AMBULATORY_CARE_PROVIDER_SITE_OTHER): Payer: 59 | Admitting: Family Medicine

## 2015-03-03 VITALS — BP 110/80 | HR 64 | Temp 98.3°F | Wt 262.8 lb

## 2015-03-03 DIAGNOSIS — R358 Other polyuria: Secondary | ICD-10-CM

## 2015-03-03 DIAGNOSIS — R300 Dysuria: Secondary | ICD-10-CM

## 2015-03-03 DIAGNOSIS — R3589 Other polyuria: Secondary | ICD-10-CM

## 2015-03-03 LAB — POCT GLUCOSE (DEVICE FOR HOME USE): Glucose Fasting, POC: 104 mg/dL — AB (ref 70–99)

## 2015-03-03 LAB — POCT URINALYSIS DIPSTICK
Bilirubin, UA: NEGATIVE
Blood, UA: NEGATIVE
Glucose, UA: NEGATIVE
Ketones, UA: NEGATIVE
Leukocytes, UA: NEGATIVE
Nitrite, UA: NEGATIVE
Protein, UA: NEGATIVE
Spec Grav, UA: 1.02
Urobilinogen, UA: 0.2
pH, UA: 7

## 2015-03-03 MED ORDER — SOLIFENACIN SUCCINATE 5 MG PO TABS: 5.0000 mg | ORAL_TABLET | Freq: Every day | ORAL | Status: DC

## 2015-03-03 NOTE — Progress Notes (Signed)
   Subjective:    Patient ID: Kelli Stout, female    DOB: 1966-01-16, 49 y.o.   MRN: [id number]  HPI Patient seen with at least one week history of some polyuria. She denies any actual burning with urination. She's had some mild low back pain. No fevers or chills. Minimal caffeine use. She has occasional episodes of stress incontinence that she's had for years. She's had some recent symptoms of urgency. No polydipsia. Father did have type 2 diabetes. Patient has no prior history of hyperglycemia. She was race recently placed on clonidine per gynecologist for hot flashes. She remains on Maxzide for hypertension as well as other drugs listed  Past Medical History  Diagnosis Date  . Chicken pox   . Depression   . Hypertension   . UTI (urinary tract infection)   . Anxiety   . HIV infection    Past Surgical History  Procedure Laterality Date  . Abdominal hysterectomy  1989    partial    reports that she has never smoked. She has never used smokeless tobacco. She reports that she does not drink alcohol or use illicit drugs. family history includes Arthritis in her maternal grandmother; Cancer in her maternal grandmother; Cancer (age of onset: 46) in her son; Colon cancer (age of onset: 22) in her maternal aunt; Diabetes in her father; Heart disease in her maternal uncle and mother; Hyperlipidemia in her mother; Hypertension in her father and mother; Mental illness in her mother. No Known Allergies    Review of Systems  Constitutional: Negative for fever and chills.  Gastrointestinal: Negative for abdominal pain.  Endocrine: Positive for polyuria. Negative for polydipsia.  Genitourinary: Negative for hematuria and difficulty urinating.       Objective:   Physical Exam  Constitutional: She appears well-developed and well-nourished.  Cardiovascular: Normal rate and regular rhythm.   Pulmonary/Chest: Effort normal and breath sounds normal. No respiratory distress. She has no  wheezes. She has no rales.          Assessment & Plan:  Polyuria. Urine dipstick normal. Check blood sugar-and she is fasting today. Fasting blood sugar 104. Suspect she has some urine urgency. Continue to avoid caffeine. Trial of Vesicare 5 mg daily at bedtime. Reviewed possible side effects

## 2015-03-03 NOTE — Patient Instructions (Signed)

## 2015-03-03 NOTE — Progress Notes (Signed)
Pre visit review using our clinic review tool, if applicable. No additional management support is needed unless otherwise documented below in the visit note. 

## 2015-05-30 ENCOUNTER — Ambulatory Visit: Payer: 59 | Admitting: Family Medicine

## 2015-06-03 ENCOUNTER — Encounter: Payer: Self-pay | Admitting: Family Medicine

## 2015-06-03 ENCOUNTER — Ambulatory Visit (INDEPENDENT_AMBULATORY_CARE_PROVIDER_SITE_OTHER): Payer: 59 | Admitting: Family Medicine

## 2015-06-03 VITALS — BP 128/80 | HR 83 | Temp 97.7°F | Wt 259.0 lb

## 2015-06-03 DIAGNOSIS — H8112 Benign paroxysmal vertigo, left ear: Secondary | ICD-10-CM | POA: Diagnosis not present

## 2015-06-03 DIAGNOSIS — Z23 Encounter for immunization: Secondary | ICD-10-CM | POA: Diagnosis not present

## 2015-06-03 DIAGNOSIS — I10 Essential (primary) hypertension: Secondary | ICD-10-CM

## 2015-06-03 NOTE — Patient Instructions (Signed)
Benign Positional Vertigo Vertigo means you feel like you or your surroundings are moving when they are not. Benign positional vertigo is the most common form of vertigo. Benign means that the cause of your condition is not serious. Benign positional vertigo is more common in older adults. CAUSES  Benign positional vertigo is the result of an upset in the labyrinth system. This is an area in the middle ear that helps control your balance. This may be caused by a viral infection, head injury, or repetitive motion. However, often no specific cause is found. SYMPTOMS  Symptoms of benign positional vertigo occur when you move your head or eyes in different directions. Some of the symptoms may include:  Loss of balance and falls.  Vomiting.  Blurred vision.  Dizziness.  Nausea.  Involuntary eye movements (nystagmus). DIAGNOSIS  Benign positional vertigo is usually diagnosed by physical exam. If the specific cause of your benign positional vertigo is unknown, your caregiver may perform imaging tests, such as magnetic resonance imaging (MRI) or computed tomography (CT). TREATMENT  Your caregiver may recommend movements or procedures to correct the benign positional vertigo. Medicines such as meclizine, benzodiazepines, and medicines for nausea may be used to treat your symptoms. In rare cases, if your symptoms are caused by certain conditions that affect the inner ear, you may need surgery. HOME CARE INSTRUCTIONS   Follow your caregiver's instructions.  Move slowly. Do not make sudden body or head movements.  Avoid driving.  Avoid operating heavy machinery.  Avoid performing any tasks that would be dangerous to you or others during a vertigo episode.  Drink enough fluids to keep your urine clear or pale yellow. SEEK IMMEDIATE MEDICAL CARE IF:   You develop problems with walking, weakness, numbness, or using your arms, hands, or legs.  You have difficulty speaking.  You develop  severe headaches.  Your nausea or vomiting continues or gets worse.  You develop visual changes.  Your family or friends notice any behavioral changes.  Your condition gets worse.  You have a fever.  You develop a stiff neck or sensitivity to light. MAKE SURE YOU:   Understand these instructions.  Will watch your condition.  Will get help right away if you are not doing well or get worse. Document Released: 06/14/2006 Document Revised: 11/29/2011 Document Reviewed: 05/27/2011 Exit[employer] Patient Information 2015 [employer]. This information is not intended to replace advice given to you by your health care provider. Make sure you discuss any questions you have with your health care provider.    

## 2015-06-03 NOTE — Progress Notes (Signed)
   Subjective:    Patient ID: Kelli Stout, female    DOB: Feb 05, 1966, 49 y.o.   MRN: [id number]  HPI Patient seen for evaluation of vertigo. She had onset on September 2 of vertigo which she woke up. She notices her symptoms are worse to the left. She had some nausea without vomiting. She went to urgent care center and prescribed meclizine and Zofran. Symptoms are gradually improving. She denies any headache. No slurred speech. No difficulty swallowing. No ataxia. Symptoms exacerbated by movement. No alleviating factors other than holding still. She relates she has had similar episodes in the past and generally gets about 2-3 flareups per year. She was at work for about 4 days.  She does have hypertension history which has been well controlled. No orthostatic symptoms or dizziness related to blood pressure change  Past Medical History  Diagnosis Date  . Chicken pox   . Depression   . Hypertension   . UTI (urinary tract infection)   . Anxiety   . HIV infection    Past Surgical History  Procedure Laterality Date  . Abdominal hysterectomy  1989    partial    reports that she has never smoked. She has never used smokeless tobacco. She reports that she does not drink alcohol or use illicit drugs. family history includes Arthritis in her maternal grandmother; Cancer in her maternal grandmother; Cancer (age of onset: 2) in her son; Colon cancer (age of onset: 66) in her maternal aunt; Diabetes in her father; Heart disease in her maternal uncle and mother; Hyperlipidemia in her mother; Hypertension in her father and mother; Mental illness in her mother. No Known Allergies    Review of Systems  Constitutional: Negative for fatigue.  HENT: Negative for congestion.   Eyes: Negative for visual disturbance.  Respiratory: Negative for cough, chest tightness, shortness of breath and wheezing.   Cardiovascular: Negative for chest pain, palpitations and leg swelling.  Genitourinary: Negative  for dysuria.  Neurological: Positive for dizziness. Negative for seizures, syncope, weakness, light-headedness and headaches.  Psychiatric/Behavioral: Negative for confusion.       Objective:   Physical Exam  Constitutional: She is oriented to person, place, and time. She appears well-developed and well-nourished.  HENT:  Right Ear: External ear normal.  Left Ear: External ear normal.  Eyes: Pupils are equal, round, and reactive to light.  Neck: Neck supple. No thyromegaly present.  Cardiovascular: Normal rate and regular rhythm.   Pulmonary/Chest: Effort normal and breath sounds normal. No respiratory distress. She has no wheezes. She has no rales.  Musculoskeletal: She exhibits no edema.  Neurological: She is alert and oriented to person, place, and time. No cranial nerve deficit.  Cerebellar testing normal. Patient has mild vertigo when lying supine with head to the left but not the right.  No nystagmus noted  Psychiatric: She has a normal mood and affect. Her behavior is normal.          Assessment & Plan:  Suspect benign peripheral positional vertigo to the left. Handout on Epley maneuvers given. We discussed possible vestibular rehabilitation at this point she wishes to try on her own. She will submit FMLA papers to fill out since this is been a recurrent issue for her  Hypertension which is stable on medication

## 2015-06-03 NOTE — Addendum Note (Signed)
Addended by: Thomasena Edis on: 06/03/2015 09:13 AM   Modules accepted: Orders

## 2015-06-03 NOTE — Progress Notes (Signed)
Pre visit review using our clinic review tool, if applicable. No additional management support is needed unless otherwise documented below in the visit note. 

## 2015-06-08 ENCOUNTER — Encounter: Payer: Self-pay | Admitting: Family Medicine

## 2015-06-08 DIAGNOSIS — H8110 Benign paroxysmal vertigo, unspecified ear: Secondary | ICD-10-CM | POA: Insufficient documentation

## 2015-06-20 ENCOUNTER — Telehealth: Payer: Self-pay | Admitting: Family Medicine

## 2015-06-20 NOTE — Telephone Encounter (Signed)
Wrong FMLA was fax to [place]. Pt was notified

## 2015-06-20 NOTE — Telephone Encounter (Signed)
Pt is calling regarding her FMLA paperwork that we rcvd last week, can you please advise status?

## 2015-06-26 ENCOUNTER — Telehealth: Payer: Self-pay | Admitting: Family Medicine

## 2015-06-26 NOTE — Telephone Encounter (Signed)
Pt is still waiting on her FMLA paperwork

## 2015-06-30 NOTE — Telephone Encounter (Signed)
Pt would like you to call her anytime BEFORE 3:30 tomorrow, tues 10/11

## 2015-07-01 NOTE — Telephone Encounter (Signed)
Spoke to pt

## 2015-07-14 ENCOUNTER — Other Ambulatory Visit: Payer: Self-pay | Admitting: Family Medicine

## 2015-08-22 ENCOUNTER — Ambulatory Visit (INDEPENDENT_AMBULATORY_CARE_PROVIDER_SITE_OTHER): Payer: 59 | Admitting: Family Medicine

## 2015-08-22 ENCOUNTER — Encounter: Payer: Self-pay | Admitting: Family Medicine

## 2015-08-22 VITALS — BP 119/78 | HR 90 | Temp 98.6°F | Ht 68.0 in | Wt 261.0 lb

## 2015-08-22 DIAGNOSIS — J019 Acute sinusitis, unspecified: Secondary | ICD-10-CM | POA: Diagnosis not present

## 2015-08-22 MED ORDER — AMOXICILLIN-POT CLAVULANATE 875-125 MG PO TABS: 1.0000 | ORAL_TABLET | Freq: Two times a day (BID) | ORAL | Status: DC

## 2015-08-22 NOTE — Progress Notes (Signed)
Pre visit review using our clinic review tool, if applicable. No additional management support is needed unless otherwise documented below in the visit note. 

## 2015-08-22 NOTE — Progress Notes (Signed)
   Subjective:    Patient ID: Kelli Stout, female    DOB: 11-13-1965, 49 y.o.   MRN: [id number]  HPI Here for 5 days of sinus pressure, PND, ST , and a dry cough.    Review of Systems  Constitutional: Negative.   HENT: Positive for congestion, postnasal drip, sore throat and trouble swallowing. Negative for ear pain.   Eyes: Negative.   Respiratory: Positive for cough.        Objective:   Physical Exam  Constitutional: She appears well-developed and well-nourished.  HENT:  Right Ear: External ear normal.  Left Ear: External ear normal.  Nose: Nose normal.  Mouth/Throat: Oropharynx is clear and moist.  Eyes: Conjunctivae are normal.  Neck: No thyromegaly present.  Pulmonary/Chest: Effort normal and breath sounds normal.  Lymphadenopathy:    She has no cervical adenopathy.          Assessment & Plan:  Sinusitis, treat with Augmentin.

## 2015-08-28 ENCOUNTER — Ambulatory Visit: Payer: 59 | Admitting: Family Medicine

## 2015-09-16 ENCOUNTER — Other Ambulatory Visit: Payer: Self-pay | Admitting: Family Medicine

## 2015-09-25 ENCOUNTER — Ambulatory Visit: Payer: 59 | Admitting: Podiatry

## 2015-10-06 MED FILL — EDURANT 25 MG TABS: 25 | 30 days supply | Qty: 30 | Fill #7

## 2015-10-06 MED FILL — AMLODIPINE BESYLATE 5 MG TA: 5 | 90 days supply | Qty: 45 | Fill #3

## 2015-10-06 MED FILL — VESIcare 5 MG TABS: 5 | 30 days supply | Qty: 30 | Fill #0

## 2015-10-06 MED FILL — TRUVADA 200-300 MG TABS: 200-300 | 30 days supply | Qty: 30 | Fill #7

## 2015-10-06 MED FILL — VALACYCLOVIR HCL 500 MG TAB: 500 | 30 days supply | Qty: 30 | Fill #10

## 2015-11-03 MED FILL — EDURANT 25 MG TABS: 25 | 30 days supply | Qty: 30 | Fill #8

## 2015-11-03 MED FILL — VALACYCLOVIR HCL 500 MG TAB: 500 | 30 days supply | Qty: 30 | Fill #11

## 2015-11-03 MED FILL — VESIcare 5 MG TABS: 5 | 30 days supply | Qty: 30 | Fill #1

## 2015-11-03 MED FILL — TRUVADA 200-300 MG TABS: 200-300 | 30 days supply | Qty: 30 | Fill #8

## 2015-11-03 MED FILL — cloNIDine HCL 0.2 MG TABS: 0.2 | 90 days supply | Qty: 90 | Fill #1

## 2015-11-27 DIAGNOSIS — L609 Nail disorder, unspecified: Secondary | ICD-10-CM | POA: Diagnosis not present

## 2015-11-27 DIAGNOSIS — M2011 Hallux valgus (acquired), right foot: Secondary | ICD-10-CM | POA: Diagnosis not present

## 2015-11-27 DIAGNOSIS — M79675 Pain in left toe(s): Secondary | ICD-10-CM | POA: Diagnosis not present

## 2015-11-27 DIAGNOSIS — M2012 Hallux valgus (acquired), left foot: Secondary | ICD-10-CM | POA: Diagnosis not present

## 2015-11-27 DIAGNOSIS — L602 Onychogryphosis: Secondary | ICD-10-CM | POA: Diagnosis not present

## 2015-11-27 DIAGNOSIS — L03032 Cellulitis of left toe: Secondary | ICD-10-CM | POA: Diagnosis not present

## 2015-12-15 MED FILL — VENLAFAXINE HCL ER 150 MG C: 150 | 90 days supply | Qty: 90 | Fill #2

## 2015-12-15 MED FILL — TRIAMTERENE-HCTZ 37.5-25 MG: 37.5-25 | 30 days supply | Qty: 15 | Fill #2

## 2015-12-15 MED FILL — TRUVADA 200-300 MG TABS: 200-300 | 30 days supply | Qty: 30 | Fill #9

## 2015-12-15 MED FILL — EDURANT 25 MG TABS: 25 | 30 days supply | Qty: 30 | Fill #9

## 2015-12-15 MED FILL — VESIcare 5 MG TABS: 5 | 30 days supply | Qty: 30 | Fill #2

## 2015-12-18 DIAGNOSIS — M2012 Hallux valgus (acquired), left foot: Secondary | ICD-10-CM | POA: Diagnosis not present

## 2015-12-18 DIAGNOSIS — M25774 Osteophyte, right foot: Secondary | ICD-10-CM | POA: Diagnosis not present

## 2015-12-18 DIAGNOSIS — M79675 Pain in left toe(s): Secondary | ICD-10-CM | POA: Diagnosis not present

## 2015-12-18 DIAGNOSIS — M25775 Osteophyte, left foot: Secondary | ICD-10-CM | POA: Diagnosis not present

## 2015-12-18 DIAGNOSIS — M2011 Hallux valgus (acquired), right foot: Secondary | ICD-10-CM | POA: Diagnosis not present

## 2015-12-18 DIAGNOSIS — L03032 Cellulitis of left toe: Secondary | ICD-10-CM | POA: Diagnosis not present

## 2016-01-01 DIAGNOSIS — B20 Human immunodeficiency virus [HIV] disease: Secondary | ICD-10-CM | POA: Diagnosis not present

## 2016-01-01 DIAGNOSIS — E119 Type 2 diabetes mellitus without complications: Secondary | ICD-10-CM | POA: Diagnosis not present

## 2016-01-01 DIAGNOSIS — Z9851 Tubal ligation status: Secondary | ICD-10-CM | POA: Diagnosis not present

## 2016-01-01 DIAGNOSIS — Z79899 Other long term (current) drug therapy: Secondary | ICD-10-CM | POA: Diagnosis not present

## 2016-01-01 DIAGNOSIS — I10 Essential (primary) hypertension: Secondary | ICD-10-CM | POA: Diagnosis not present

## 2016-01-01 DIAGNOSIS — R32 Unspecified urinary incontinence: Secondary | ICD-10-CM | POA: Diagnosis not present

## 2016-01-01 DIAGNOSIS — Z90710 Acquired absence of both cervix and uterus: Secondary | ICD-10-CM | POA: Diagnosis not present

## 2016-01-01 DIAGNOSIS — Z01419 Encounter for gynecological examination (general) (routine) without abnormal findings: Secondary | ICD-10-CM | POA: Diagnosis not present

## 2016-01-01 DIAGNOSIS — R232 Flushing: Secondary | ICD-10-CM | POA: Diagnosis not present

## 2016-01-06 DIAGNOSIS — M79675 Pain in left toe(s): Secondary | ICD-10-CM | POA: Diagnosis not present

## 2016-01-06 DIAGNOSIS — M25774 Osteophyte, right foot: Secondary | ICD-10-CM | POA: Diagnosis not present

## 2016-01-06 DIAGNOSIS — M2012 Hallux valgus (acquired), left foot: Secondary | ICD-10-CM | POA: Diagnosis not present

## 2016-01-06 DIAGNOSIS — M25775 Osteophyte, left foot: Secondary | ICD-10-CM | POA: Diagnosis not present

## 2016-01-06 DIAGNOSIS — M2011 Hallux valgus (acquired), right foot: Secondary | ICD-10-CM | POA: Diagnosis not present

## 2016-01-12 ENCOUNTER — Other Ambulatory Visit: Payer: Self-pay | Admitting: Family Medicine

## 2016-01-12 MED FILL — VESIcare 5 MG TABS: 5 | 30 days supply | Qty: 30 | Fill #3

## 2016-01-12 MED FILL — AMLODIPINE BESYLATE 5 MG TA: 5 | 90 days supply | Qty: 45 | Fill #0

## 2016-01-12 MED FILL — TRUVADA 200-300 MG TABS: 200-300 | 30 days supply | Qty: 30 | Fill #0

## 2016-01-12 MED FILL — VALACYCLOVIR HCL 500 MG TAB: 500 | 30 days supply | Qty: 30 | Fill #0

## 2016-01-12 MED FILL — TRIAMTERENE-HCTZ 37.5-25 MG: 37.5-25 | 90 days supply | Qty: 45 | Fill #0

## 2016-01-12 MED FILL — EDURANT 25 MG TABS: 25 | 30 days supply | Qty: 30 | Fill #0

## 2016-01-16 DIAGNOSIS — Z79899 Other long term (current) drug therapy: Secondary | ICD-10-CM | POA: Diagnosis not present

## 2016-01-16 DIAGNOSIS — I10 Essential (primary) hypertension: Secondary | ICD-10-CM | POA: Diagnosis not present

## 2016-01-16 DIAGNOSIS — B20 Human immunodeficiency virus [HIV] disease: Secondary | ICD-10-CM | POA: Diagnosis not present

## 2016-01-16 DIAGNOSIS — E119 Type 2 diabetes mellitus without complications: Secondary | ICD-10-CM | POA: Diagnosis not present

## 2016-01-23 MED FILL — ESTRADIOL 0.075 MG PATCH: 0.075 | 28 days supply | Qty: 8 | Fill #0

## 2016-02-09 MED FILL — cloNIDine HCL 0.2 MG TABS: 0.2 | 90 days supply | Qty: 90 | Fill #0

## 2016-02-09 MED FILL — VESIcare 5 MG TABS: 5 | 30 days supply | Qty: 30 | Fill #4

## 2016-02-13 DIAGNOSIS — M79604 Pain in right leg: Secondary | ICD-10-CM | POA: Diagnosis not present

## 2016-02-13 DIAGNOSIS — M79605 Pain in left leg: Secondary | ICD-10-CM | POA: Diagnosis not present

## 2016-02-13 DIAGNOSIS — M79651 Pain in right thigh: Secondary | ICD-10-CM | POA: Diagnosis not present

## 2016-02-18 MED FILL — ESTRADIOL 0.075 MG PATCH: 0.075 | 28 days supply | Qty: 8 | Fill #1

## 2016-02-19 ENCOUNTER — Other Ambulatory Visit: Payer: Self-pay | Admitting: General Practice

## 2016-02-19 MED ORDER — TRIAMCINOLONE ACETONIDE 0.1 % EX CREA: 1.0000 "application " | TOPICAL_CREAM | Freq: Two times a day (BID) | CUTANEOUS | Status: DC

## 2016-02-19 MED FILL — TRIAMCINOLONE/EUCER CRM: 30 days supply | Qty: 454 | Fill #0

## 2016-02-25 MED FILL — TRUVADA 200-300 MG TABS: 200-300 | 30 days supply | Qty: 30 | Fill #1

## 2016-02-25 MED FILL — VALACYCLOVIR HCL 500 MG TAB: 500 | 30 days supply | Qty: 30 | Fill #1

## 2016-02-25 MED FILL — EDURANT 25 MG TABS: 25 | 30 days supply | Qty: 30 | Fill #1

## 2016-03-01 ENCOUNTER — Other Ambulatory Visit: Payer: Self-pay | Admitting: Family Medicine

## 2016-03-01 MED FILL — VENLAFAXINE HCL ER 150 MG C: 150 | 90 days supply | Qty: 90 | Fill #0

## 2016-03-01 MED FILL — VESIcare 5 MG TABS: 5 | 30 days supply | Qty: 30 | Fill #0

## 2016-03-22 DIAGNOSIS — N760 Acute vaginitis: Secondary | ICD-10-CM | POA: Diagnosis not present

## 2016-03-22 MED FILL — EDURANT 25 MG TABS: 25 | 30 days supply | Qty: 30 | Fill #2

## 2016-03-22 MED FILL — ESTRADIOL 0.075 MG PATCH: 0.075 | 28 days supply | Qty: 8 | Fill #2

## 2016-03-22 MED FILL — TRUVADA 200-300 MG TABS: 200-300 | 30 days supply | Qty: 30 | Fill #2

## 2016-03-22 MED FILL — VALACYCLOVIR HCL 500 MG TAB: 500 | 30 days supply | Qty: 30 | Fill #2

## 2016-04-23 MED FILL — TRIAMTERENE/HCTZ 37.5/25 TB: 37.5-25 | 90 days supply | Qty: 45 | Fill #1

## 2016-04-23 MED FILL — VESIcare 5 MG TABS: 5 | 30 days supply | Qty: 30 | Fill #1

## 2016-04-23 MED FILL — EDURANT 25 MG TABS: 25 | 30 days supply | Qty: 30 | Fill #3

## 2016-04-23 MED FILL — VALACYCLOVIR HCL 500 MG TAB: 500 | 30 days supply | Qty: 30 | Fill #3

## 2016-04-23 MED FILL — TRUVADA 200-300 MG TABS: 200-300 | 30 days supply | Qty: 30 | Fill #3

## 2016-04-26 MED FILL — AMLODIPINE BESYLATE 5 MG TA: 5 | 90 days supply | Qty: 45 | Fill #1

## 2016-05-07 DIAGNOSIS — N393 Stress incontinence (female) (male): Secondary | ICD-10-CM | POA: Diagnosis not present

## 2016-05-10 ENCOUNTER — Other Ambulatory Visit: Payer: Self-pay | Admitting: Family Medicine

## 2016-05-11 ENCOUNTER — Ambulatory Visit (INDEPENDENT_AMBULATORY_CARE_PROVIDER_SITE_OTHER): Payer: 59 | Admitting: Family Medicine

## 2016-05-11 VITALS — BP 110/86 | HR 90 | Temp 97.7°F | Ht 68.0 in | Wt 268.0 lb

## 2016-05-11 DIAGNOSIS — R3915 Urgency of urination: Secondary | ICD-10-CM

## 2016-05-11 DIAGNOSIS — F339 Major depressive disorder, recurrent, unspecified: Secondary | ICD-10-CM

## 2016-05-11 DIAGNOSIS — I10 Essential (primary) hypertension: Secondary | ICD-10-CM

## 2016-05-11 MED ORDER — SOLIFENACIN SUCCINATE 5 MG PO TABS: 5.0000 mg | ORAL_TABLET | Freq: Every day | ORAL | 3 refills | Status: DC

## 2016-05-11 NOTE — Progress Notes (Signed)
Subjective:     Patient ID: Kelli Stout, female   DOB: June 12, 1966, 50 y.o.   MRN: [id number]  HPI Patient seen for medical follow-up She has HIV which is [facility].  Hypertension treated with amlodipine and Maxzide. Compliant with therapy. No dizziness. No headaches. No peripheral edema. Blood pressure has been very well controlled. Recent [facility] which [facility] were reviewed  Patient has history of urinary urgency. She's been on Vesicare 5 mg daily for several years and this is working well. She still has occasional nocturia. Denies dry mouth. No constipation issues.  History of depression which has been recurrent and is stable on Effexor. No suicidal ideation  Patient is very busy working 2 jobs several days per week. Very little time for exercise. She's not had a mammogram in over 2 years. Colonoscopy up-to-date. She's had previous hysterectomy for benign disease  Past Medical History:  Diagnosis Date  . Anxiety   . Chicken pox   . Depression   . HIV infection (HCC)   . Hypertension   . UTI (urinary tract infection)    Past Surgical History:  Procedure Laterality Date  . ABDOMINAL HYSTERECTOMY  1989   partial    reports that she has never smoked. She has never used smokeless tobacco. She reports that she does not drink alcohol or use drugs. family history includes Arthritis in her maternal grandmother; Cancer in her maternal grandmother; Cancer (age of onset: 2117) in her son; Colon cancer (age of onset: 6770) in her maternal aunt; Diabetes in her father; Heart disease in her maternal uncle and mother; Hyperlipidemia in her mother; Hypertension in her father and mother; Mental illness in her mother. No Known Allergies   Review of Systems  Constitutional: Negative for appetite change, fatigue and unexpected weight change.  Eyes: Negative for visual disturbance.  Respiratory: Negative for cough, chest tightness, shortness of breath and wheezing.    Cardiovascular: Negative for chest pain, palpitations and leg swelling.  Gastrointestinal: Negative for abdominal pain.  Endocrine: Negative for polydipsia and polyuria.  Genitourinary: Positive for urgency.  Neurological: Negative for dizziness, seizures, syncope, weakness, light-headedness and headaches.       Objective:   Physical Exam  Constitutional: She appears well-developed and well-nourished. No distress.  Neck: Neck supple. No thyromegaly present.  Cardiovascular: Normal rate and regular rhythm.  Exam reveals no gallop.   Pulmonary/Chest: Effort normal and breath sounds normal. No respiratory distress. She has no wheezes. She has no rales.  Musculoskeletal: She exhibits no edema.  Psychiatric: She has a normal mood and affect. Her behavior is normal.       Assessment:     #1 hypertension stable and at goal  #2 urinary urgency  #3 history of recurrent depression    Plan:     -Refill Vesicare for one year -Continue current blood pressure medications -She is encouraged to lose some weight -Set up repeat mammogram -Consider follow-up with of this year for complete physical  Kristian CoveyBruce W Zani Kyllonen MD [facility]

## 2016-05-11 NOTE — Patient Instructions (Signed)
Recommend setting up mammogram this year.

## 2016-05-11 NOTE — Progress Notes (Signed)
Pre visit review using our clinic review tool, if applicable. No additional management support is needed unless otherwise documented below in the visit note. 

## 2016-05-21 MED FILL — VESIcare 5 MG TABS: 5 | 30 days supply | Qty: 30 | Fill #0

## 2016-05-21 MED FILL — cloNIDine HCL 0.2 MG TABS: 0.2 | 90 days supply | Qty: 90 | Fill #1

## 2016-05-25 MED FILL — EDURANT 25 MG TABS: 25 | 30 days supply | Qty: 30 | Fill #0

## 2016-05-25 MED FILL — VALACYCLOVIR HCL 500 MG TAB: 500 | 30 days supply | Qty: 30 | Fill #0

## 2016-05-25 MED FILL — TRUVADA 200-300 MG TABS: 200-300 | 30 days supply | Qty: 30 | Fill #0

## 2016-06-14 MED FILL — VENLAFAXINE HCL ER 150 MG C: 150 | 90 days supply | Qty: 90 | Fill #1

## 2016-06-17 DIAGNOSIS — Z1231 Encounter for screening mammogram for malignant neoplasm of breast: Secondary | ICD-10-CM | POA: Diagnosis not present

## 2016-06-23 MED FILL — EDURANT 25 MG TABS: 25 | 30 days supply | Qty: 30 | Fill #1

## 2016-06-23 MED FILL — TRUVADA 200-300 MG TABS: 200-300 | 30 days supply | Qty: 30 | Fill #1

## 2016-06-23 MED FILL — VALACYCLOVIR HCL 500 MG TAB: 500 | 30 days supply | Qty: 30 | Fill #1

## 2016-07-26 MED FILL — VALACYCLOVIR HCL 500 MG TAB: 500 | 30 days supply | Qty: 30 | Fill #2

## 2016-07-26 MED FILL — EDURANT 25 MG TABS: 25 | 30 days supply | Qty: 30 | Fill #2

## 2016-07-26 MED FILL — TRUVADA 200-300 MG TABS: 200-300 | 30 days supply | Qty: 30 | Fill #2

## 2016-07-30 ENCOUNTER — Other Ambulatory Visit: Payer: Self-pay | Admitting: Family Medicine

## 2016-08-17 ENCOUNTER — Other Ambulatory Visit: Payer: Self-pay | Admitting: Family Medicine

## 2016-08-17 MED FILL — EDURANT 25 MG TABS: 25 | 30 days supply | Qty: 30 | Fill #3

## 2016-08-17 MED FILL — TRUVADA 200-300 MG TABS: 200-300 | 30 days supply | Qty: 30 | Fill #3

## 2016-08-17 MED FILL — cloNIDine HCL 0.2 MG TABS: 0.2 | 90 days supply | Qty: 90 | Fill #0

## 2016-08-17 MED FILL — AMLODIPINE BESYLATE 5 MG TA: 5 | 90 days supply | Qty: 45 | Fill #0 | Status: TO

## 2016-08-18 MED FILL — TRIAMTERENE-HCTZ 37.5-25 MG: 37.5-25 | 90 days supply | Qty: 45 | Fill #0 | Status: TO

## 2016-08-23 MED FILL — VALACYCLOVIR HCL 500 MG TAB: 500 | 30 days supply | Qty: 30 | Fill #3

## 2016-08-23 MED FILL — VESIcare 5 MG TABS: 5 | 30 days supply | Qty: 30 | Fill #1

## 2016-08-30 ENCOUNTER — Ambulatory Visit (INDEPENDENT_AMBULATORY_CARE_PROVIDER_SITE_OTHER): Payer: 59 | Admitting: Family Medicine

## 2016-08-30 VITALS — BP 130/96 | HR 88 | Ht 68.0 in | Wt 260.9 lb

## 2016-08-30 DIAGNOSIS — R682 Dry mouth, unspecified: Secondary | ICD-10-CM

## 2016-08-30 DIAGNOSIS — R631 Polydipsia: Secondary | ICD-10-CM

## 2016-08-30 LAB — GLUCOSE, POCT (MANUAL RESULT ENTRY): POC Glucose: 107 mg/dl — AB (ref 70–99)

## 2016-08-30 LAB — POCT GLYCOSYLATED HEMOGLOBIN (HGB A1C): Hemoglobin A1C: 5.7

## 2016-08-30 NOTE — Patient Instructions (Signed)
Prediabetes Prediabetes is the condition of having a blood sugar (blood glucose) level that is higher than it should be, but not high enough for you to be diagnosed with type 2 diabetes. Having prediabetes puts you at risk for developing type 2 diabetes (type 2 diabetes mellitus). Prediabetes may be called impaired glucose tolerance or impaired fasting glucose. Prediabetes usually does not cause symptoms. Your health care provider can diagnose this condition with blood tests. You may be tested for prediabetes if you are overweight and if you have at least one other risk factor for prediabetes. Risk factors for prediabetes include:  Having a family member with type 2 diabetes.  Being overweight or obese.  Being older than age 45.  Being of American-Indian, African-American, Hispanic/Latino, or Asian/Pacific Islander descent.  Having an inactive (sedentary) lifestyle.  Having a history of gestational diabetes or polycystic ovarian syndrome (PCOS).  Having low levels of good cholesterol (HDL-C) or high levels of blood fats (triglycerides).  Having high blood pressure. What is blood glucose and how is blood glucose measured?   Blood glucose refers to the amount of glucose in your bloodstream. Glucose comes from eating foods that contain sugars and starches (carbohydrates) that the body breaks down into glucose. Your blood glucose level may be measured in mg/dL (milligrams per deciliter) or mmol/L (millimoles per liter).Your blood glucose may be checked with one or more of the following blood tests:  A fasting blood glucose (FBG) test. You will not be allowed to eat (you will fast) for at least 8 hours before a blood sample is taken.  A normal range for FBG is 70-100 mg/dl (3.9-5.6 mmol/L).  An A1c (hemoglobin A1c) blood test. This test provides information about blood glucose control over the previous 2?3months.  An oral glucose tolerance test (OGTT). This test measures your blood glucose  twice:  After fasting. This is your baseline level.  Two hours after you drink a beverage that contains glucose. You may be diagnosed with prediabetes:  If your FBG is 100?125 mg/dL (5.6-6.9 mmol/L).  If your A1c level is 5.7?6.4%.  If your OGGT result is 140?199 mg/dL (7.8-11 mmol/L). These blood tests may be repeated to confirm your diagnosis. What happens if blood glucose is too high? The pancreas produces a hormone (insulin) that helps move glucose from the bloodstream into cells. When cells in the body do not respond properly to insulin that the body makes (insulin resistance), excess glucose builds up in the blood instead of going into cells. As a result, high blood glucose (hyperglycemia) can develop, which can cause many complications. This is a symptom of prediabetes. What can happen if blood glucose stays higher than normal for a long time? Having high blood glucose for a long time is dangerous. Too much glucose in your blood can damage your nerves and blood vessels. Long-term damage can lead to complications from diabetes, which may include:  Heart disease.  Stroke.  Blindness.  Kidney disease.  Depression.  Poor circulation in the feet and legs, which could lead to surgical removal (amputation) in severe cases. How can prediabetes be prevented from turning into type 2 diabetes?   To help prevent type 2 diabetes, take the following actions:  Be physically active.  Do moderate-intensity physical activity for at least 30 minutes on at least 5 days of the week, or as much as told by your health care provider. This could be brisk walking, biking, or water aerobics.  Ask your health care provider what activities are   safe for you. A mix of physical activities may be best, such as walking, swimming, cycling, and strength training.  Lose weight as told by your health care provider.  Losing 5-7% of your body weight can reverse insulin resistance.  Your health care  provider can determine how much weight loss is best for you and can help you lose weight safely.  Follow a healthy meal plan. This includes eating lean proteins, complex carbohydrates, fresh fruits and vegetables, low-fat dairy products, and healthy fats.  Follow instructions from your health care provider about eating or drinking restrictions.  Make an appointment to see a diet and nutrition specialist (registered dietitian) to help you create a healthy eating plan that is right for you.  Do not smoke or use any tobacco products, such as cigarettes, chewing tobacco, and e-cigarettes. If you need help quitting, ask your health care provider.  Take over-the-counter and prescription medicines as told by your health care provider. You may be prescribed medicines that help lower the risk of type 2 diabetes. This information is not intended to replace advice given to you by your health care provider. Make sure you discuss any questions you have with your health care provider. Document Released: 12/29/2015 Document Revised: 02/12/2016 Document Reviewed: 10/28/2015 Elsevier Interactive Patient Education  2017 Elsevier Inc.  

## 2016-08-30 NOTE — Progress Notes (Signed)
Pre visit review using our clinic review tool, if applicable. No additional management support is needed unless otherwise documented below in the visit note. 

## 2016-08-30 NOTE — Progress Notes (Signed)
   Subjective:    Patient ID: Kelli Stout, female    DOB: 13-Nov-1965, 50 y.o.   MRN: [id number]  HPI Patient is here with concerns to rule out type 2 diabetes. She states she's had dry mouth about a year. She has very strong family history of type 2 diabetes in her father and 1 brother. Patient denies any urine frequency or weight loss. She does take Vesicare for urine urgency which could cause some dry mouth. She tries to watch her sugar and starch intake. She's never had elevated blood sugars herself but these have not been screened recently. She is fasting this morning.  Past Medical History:  Diagnosis Date  . Anxiety   . Chicken pox   . Depression   . HIV infection (HCC)   . Hypertension   . UTI (urinary tract infection)    Past Surgical History:  Procedure Laterality Date  . ABDOMINAL HYSTERECTOMY  1989   partial    reports that she has never smoked. She has never used smokeless tobacco. She reports that she does not drink alcohol or use drugs. family history includes Arthritis in her maternal grandmother; Cancer in her maternal grandmother; Cancer (age of onset: 6117) in her son; Colon cancer (age of onset: 10770) in her maternal aunt; Diabetes in her father; Heart disease in her maternal uncle and mother; Hyperlipidemia in her mother; Hypertension in her father and mother; Mental illness in her mother. No Known Allergies    Review of Systems  Constitutional: Negative for appetite change and unexpected weight change.  Respiratory: Negative for shortness of breath.   Cardiovascular: Negative for chest pain.  Genitourinary: Negative for frequency.       Objective:   Physical Exam  Constitutional: She appears well-developed and well-nourished.  HENT:  Mouth/Throat: Oropharynx is clear and moist. No oropharyngeal exudate.  Cardiovascular: Normal rate and regular rhythm.   Pulmonary/Chest: Effort normal and breath sounds normal. No respiratory distress. She has no  wheezes. She has no rales.  Musculoskeletal: She exhibits no edema.          Assessment & Plan:  #1 dry mouth. Probably related to Vesicare. Rule out type 2 diabetes.  Fasting glucose 107 with A1C 5.7%.  Discussed measures to reduce likelihood of type 2 diabetes.  #2 strong family history of type 2 diabetes  Kristian CoveyBruce W Olisa Quesnel MD [facility]

## 2016-08-31 ENCOUNTER — Ambulatory Visit: Payer: 59 | Admitting: Family Medicine

## 2016-08-31 DIAGNOSIS — M2042 Other hammer toe(s) (acquired), left foot: Secondary | ICD-10-CM | POA: Diagnosis not present

## 2016-08-31 DIAGNOSIS — M25571 Pain in right ankle and joints of right foot: Secondary | ICD-10-CM | POA: Diagnosis not present

## 2016-08-31 DIAGNOSIS — M2041 Other hammer toe(s) (acquired), right foot: Secondary | ICD-10-CM | POA: Diagnosis not present

## 2016-08-31 DIAGNOSIS — M25572 Pain in left ankle and joints of left foot: Secondary | ICD-10-CM | POA: Diagnosis not present

## 2016-09-22 MED FILL — VALACYCLOVIR HCL 500 MG TAB: 500 | 30 days supply | Qty: 30 | Fill #0

## 2016-09-22 MED FILL — TRUVADA 200-300 MG TABS: 200-300 | 30 days supply | Qty: 30 | Fill #0

## 2016-09-22 MED FILL — VESIcare 5 MG TABS: 5 | 30 days supply | Qty: 30 | Fill #2

## 2016-09-22 MED FILL — EDURANT 25 MG TABS: 25 | 30 days supply | Qty: 30 | Fill #0

## 2016-09-22 MED FILL — VENLAFAXINE HCL ER 150 MG C: 150 | 90 days supply | Qty: 90 | Fill #2

## 2016-09-28 ENCOUNTER — Ambulatory Visit (INDEPENDENT_AMBULATORY_CARE_PROVIDER_SITE_OTHER): Payer: 59 | Admitting: Family Medicine

## 2016-09-28 VITALS — BP 110/78 | HR 90 | Temp 97.9°F | Ht 68.0 in | Wt 260.0 lb

## 2016-09-28 DIAGNOSIS — R42 Dizziness and giddiness: Secondary | ICD-10-CM | POA: Diagnosis not present

## 2016-09-28 DIAGNOSIS — J069 Acute upper respiratory infection, unspecified: Secondary | ICD-10-CM

## 2016-09-28 DIAGNOSIS — B9789 Other viral agents as the cause of diseases classified elsewhere: Secondary | ICD-10-CM

## 2016-09-28 DIAGNOSIS — H6121 Impacted cerumen, right ear: Secondary | ICD-10-CM | POA: Diagnosis not present

## 2016-09-28 NOTE — Progress Notes (Signed)
Subjective:     Patient ID: Kelli Stout, female   DOB: [birth year], 51 y.o.   MRN: [id number]  HPI Patient seen with one-week history of cold-like symptoms. She has had some nasal congestion, fatigue, intermittent headaches and productive cough. She also relates a few day history of some vertigo. Symptoms are worse with movement. She's not had any fever. No ataxia. No focal weakness. No acute hearing loss. No speech changes or dysphagia. She has continued to work.  Past Medical History:  Diagnosis Date  . Anxiety   . Chicken pox   . Depression   . HIV infection (HCC)   . Hypertension   . UTI (urinary tract infection)    Past Surgical History:  Procedure Laterality Date  . ABDOMINAL HYSTERECTOMY  1989   partial    reports that she has never smoked. She has never used smokeless tobacco. She reports that she does not drink alcohol or use drugs. family history includes Arthritis in her maternal grandmother; Cancer in her maternal grandmother; Cancer (age of onset: 10317) in her son; Colon cancer (age of onset: 5470) in her maternal aunt; Diabetes in her father; Heart disease in her maternal uncle and mother; Hyperlipidemia in her mother; Hypertension in her father and mother; Mental illness in her mother. No Known Allergies   Review of Systems  Constitutional: Positive for fatigue. Negative for chills and fever.  HENT: Positive for congestion and postnasal drip.   Respiratory: Positive for cough. Negative for shortness of breath and wheezing.   Cardiovascular: Negative for chest pain.  Neurological: Positive for dizziness.       Objective:   Physical Exam  Constitutional: She appears well-developed and well-nourished.  HENT:  Right Ear: External ear normal.  Left Ear: External ear normal.  Mouth/Throat: Oropharynx is clear and moist.  Cerumen impaction right canal. Removed with curette. She has small serous effusion on the right. No significant erythema  Neck: Neck supple.   Cardiovascular: Normal rate and regular rhythm.   Pulmonary/Chest: Effort normal and breath sounds normal. No respiratory distress. She has no wheezes. She has no rales.  Lymphadenopathy:    She has no cervical adenopathy.       Assessment:     #1 probable viral URI with cough  #2 intermittent vertigo with nonfocal neuro exam  #3 cerumen impaction right canal removed with curette    Plan:     -No indication for antibiiotics at this time -Stay well-hydrated -Follow-up promptly for any fever or other change in symptoms, otherwise treat upper respiratory symptoms symptomatically -right ear canal cleared of cerumen with currete.   Kristian CoveyBruce W Jerris Keltz MD [facility]

## 2016-09-28 NOTE — Patient Instructions (Signed)
Vertigo Vertigo is the feeling that you or your surroundings are moving when they are not. Vertigo can be dangerous if it occurs while you are doing something that could endanger you or others, such as driving. What are the causes? This condition is caused by a disturbance in the signals that are sent by your body's sensory systems to your brain. Different causes of a disturbance can lead to vertigo, including:  Infections, especially in the inner ear.  A bad reaction to a drug, or misuse of alcohol and medicines.  Withdrawal from drugs or alcohol.  Quickly changing positions, as when lying down or rolling over in bed.  Migraine headaches.  Decreased blood flow to the brain.  Decreased blood pressure.  Increased pressure in the brain from a head or neck injury, stroke, infection, tumor, or bleeding.  Central nervous system disorders.  What are the signs or symptoms? Symptoms of this condition usually occur when you move your head or your eyes in different directions. Symptoms may start suddenly, and they usually last for less than a minute. Symptoms may include:  Loss of balance and falling.  Feeling like you are spinning or moving.  Feeling like your surroundings are spinning or moving.  Nausea and vomiting.  Blurred vision or double vision.  Difficulty hearing.  Slurred speech.  Dizziness.  Involuntary eye movement (nystagmus).  Symptoms can be mild and cause only slight annoyance, or they can be severe and interfere with daily life. Episodes of vertigo may return (recur) over time, and they are often triggered by certain movements. Symptoms may improve over time. How is this diagnosed? This condition may be diagnosed based on medical history and the quality of your nystagmus. Your health care provider may test your eye movements by asking you to quickly change positions to trigger the nystagmus. This may be called the Dix-Hallpike test, head thrust test, or roll test.  You may be referred to a health care provider who specializes in ear, nose, and throat (ENT) problems (otolaryngologist) or a provider who specializes in disorders of the central nervous system (neurologist). You may have additional testing, including:  A physical exam.  Blood tests.  MRI.  A CT scan.  An electrocardiogram (ECG). This records electrical activity in your heart.  An electroencephalogram (EEG). This records electrical activity in your brain.  Hearing tests.  How is this treated? Treatment for this condition depends on the cause and the severity of the symptoms. Treatment options include:  Medicines to treat nausea or vertigo. These are usually used for severe cases. Some medicines that are used to treat other conditions may also reduce or eliminate vertigo symptoms. These include: ? Medicines that control allergies (antihistamines). ? Medicines that control seizures (anticonvulsants). ? Medicines that relieve depression (antidepressants). ? Medicines that relieve anxiety (sedatives).  Head movements to adjust your inner ear back to normal. If your vertigo is caused by an ear problem, your health care provider may recommend certain movements to correct the problem.  Surgery. This is rare.  Follow these instructions at home: Safety  Move slowly.Avoid sudden body or head movements.  Avoid driving.  Avoid operating heavy machinery.  Avoid doing any tasks that would cause danger to you or others if you would have a vertigo episode during the task.  If you have trouble walking or keeping your balance, try using a cane for stability. If you feel dizzy or unstable, sit down right away.  Return to your normal activities as told by your   health care provider. Ask your health care provider what activities are safe for you. General instructions  Take over-the-counter and prescription medicines only as told by your health care provider.  Avoid certain positions or  movements as told by your health care provider.  Drink enough fluid to keep your urine clear or pale yellow.  Keep all follow-up visits as told by your health care provider. This is important. Contact a health care provider if:  Your medicines do not relieve your vertigo or they make it worse.  You have a fever.  Your condition gets worse or you develop new symptoms.  Your family or friends notice any behavioral changes.  Your nausea or vomiting gets worse.  You have numbness or a "pins and needles" sensation in part of your body. Get help right away if:  You have difficulty moving or speaking.  You are always dizzy.  You faint.  You develop severe headaches.  You have weakness in your hands, arms, or legs.  You have changes in your hearing or vision.  You develop a stiff neck.  You develop sensitivity to light. This information is not intended to replace advice given to you by your health care provider. Make sure you discuss any questions you have with your health care provider. Document Released: 06/16/2005 Document Revised: 02/18/2016 Document Reviewed: 12/30/2014 Elsevier Interactive Patient Education  2017 Elsevier Inc.  

## 2016-09-28 NOTE — Progress Notes (Signed)
Pre visit review using our clinic review tool, if applicable. No additional management support is needed unless otherwise documented below in the visit note. 

## 2016-10-12 DIAGNOSIS — B20 Human immunodeficiency virus [HIV] disease: Secondary | ICD-10-CM | POA: Diagnosis not present

## 2016-10-12 DIAGNOSIS — Z79899 Other long term (current) drug therapy: Secondary | ICD-10-CM | POA: Diagnosis not present

## 2016-10-26 MED FILL — TRUVADA 200-300 MG TABS: 200-300 | 30 days supply | Qty: 30 | Fill #1

## 2016-10-26 MED FILL — VESIcare 5 MG TABS: 5 | 30 days supply | Qty: 30 | Fill #3

## 2016-10-26 MED FILL — VALACYCLOVIR HCL 500 MG TAB: 500 | 30 days supply | Qty: 30 | Fill #1

## 2016-10-26 MED FILL — EDURANT 25 MG TABS: 25 | 30 days supply | Qty: 30 | Fill #1

## 2016-11-19 MED FILL — VESIcare 5 MG TABS: 5 | 30 days supply | Qty: 30 | Fill #4

## 2016-11-19 MED FILL — cloNIDine HCL 0.2 MG TABS: 0.2 | 90 days supply | Qty: 90 | Fill #1

## 2016-11-19 MED FILL — TRIAMTERENE-HCTZ 37.5-25 MG: 37.5-25 | 90 days supply | Qty: 45 | Fill #1 | Status: TO

## 2016-11-19 MED FILL — AMLODIPINE BESYLATE 5 MG TA: 5 | 90 days supply | Qty: 45 | Fill #1 | Status: TO

## 2016-11-19 MED FILL — VALACYCLOVIR HCL 500 MG TAB: 500 | 30 days supply | Qty: 30 | Fill #2

## 2016-11-19 MED FILL — EDURANT 25 MG TABS: 25 | 30 days supply | Qty: 30 | Fill #2

## 2016-11-19 MED FILL — TRUVADA 200-300 MG TABS: 200-300 | 30 days supply | Qty: 30 | Fill #2

## 2016-12-17 MED FILL — TRUVADA 200-300 MG TABS: 200-300 | 30 days supply | Qty: 30 | Fill #3

## 2016-12-17 MED FILL — VALACYCLOVIR HCL 500 MG TAB: 500 | 30 days supply | Qty: 30 | Fill #3

## 2016-12-17 MED FILL — VESIcare 5 MG TABS: 5 | 30 days supply | Qty: 30 | Fill #5

## 2016-12-17 MED FILL — VENLAFAXINE HCL ER 150 MG C: 150 | 90 days supply | Qty: 90 | Fill #0

## 2016-12-17 MED FILL — EDURANT 25 MG TABS: 25 | 30 days supply | Qty: 30 | Fill #3

## 2017-02-24 ENCOUNTER — Other Ambulatory Visit: Payer: Self-pay | Admitting: Family Medicine

## 2017-03-24 ENCOUNTER — Other Ambulatory Visit: Payer: Self-pay | Admitting: Family Medicine

## 2017-04-26 ENCOUNTER — Other Ambulatory Visit: Payer: Self-pay | Admitting: *Deleted

## 2017-04-26 MED ORDER — SOLIFENACIN SUCCINATE 5 MG PO TABS: 5.0000 mg | ORAL_TABLET | Freq: Every day | ORAL | 0 refills | Status: AC

## 2017-05-21 ENCOUNTER — Other Ambulatory Visit: Payer: Self-pay | Admitting: Family Medicine

## 2017-09-09 ENCOUNTER — Other Ambulatory Visit: Payer: Self-pay | Admitting: Family Medicine

## 2017-12-08 ENCOUNTER — Other Ambulatory Visit: Payer: Self-pay | Admitting: Family Medicine

## 2018-01-19 ENCOUNTER — Other Ambulatory Visit: Payer: Self-pay | Admitting: Family Medicine

## 2018-02-20 ENCOUNTER — Ambulatory Visit: Payer: Self-pay | Admitting: Family Medicine

## 2018-02-20 DIAGNOSIS — Z0289 Encounter for other administrative examinations: Secondary | ICD-10-CM

## 2018-05-09 ENCOUNTER — Ambulatory Visit (INDEPENDENT_AMBULATORY_CARE_PROVIDER_SITE_OTHER): Payer: BC Managed Care – PPO | Admitting: Family Medicine

## 2018-05-09 ENCOUNTER — Encounter: Payer: Self-pay | Admitting: Family Medicine

## 2018-05-09 VITALS — BP 110/70 | HR 74 | Temp 98.1°F | Ht 68.0 in | Wt 236.9 lb

## 2018-05-09 DIAGNOSIS — Z0000 Encounter for general adult medical examination without abnormal findings: Secondary | ICD-10-CM | POA: Diagnosis not present

## 2018-05-09 NOTE — Patient Instructions (Signed)
Congratulations with the weight loss!    Exercising to Lose Weight Exercising can help you to lose weight. In order to lose weight through exercise, you need to do vigorous-intensity exercise. You can tell that you are exercising with vigorous intensity if you are breathing very hard and fast and cannot hold a conversation while exercising. Moderate-intensity exercise helps to maintain your current weight. You can tell that you are exercising at a moderate level if you have a higher heart rate and faster breathing, but you are still able to hold a conversation. How often should I exercise? Choose an activity that you enjoy and set realistic goals. Your health care provider can help you to make an activity plan that works for you. Exercise regularly as directed by your health care provider. This may include:  Doing resistance training twice each week, such as: ? Push-ups. ? Sit-ups. ? Lifting weights. ? Using resistance bands.  Doing a given intensity of exercise for a given amount of time. Choose from these options: ? 150 minutes of moderate-intensity exercise every week. ? 75 minutes of vigorous-intensity exercise every week. ? A mix of moderate-intensity and vigorous-intensity exercise every week.  Children, pregnant women, people who are out of shape, people who are overweight, and older adults may need to consult a health care provider for individual recommendations. If you have any sort of medical condition, be sure to consult your health care provider before starting a new exercise program. What are some activities that can help me to lose weight?  Walking at a rate of at least 4.5 miles an hour.  Jogging or running at a rate of 5 miles per hour.  Biking at a rate of at least 10 miles per hour.  Lap swimming.  Roller-skating or in-line skating.  Cross-country skiing.  Vigorous competitive sports, such as football, basketball, and soccer.  Jumping rope.  Aerobic  dancing. How can I be more active in my day-to-day activities?  Use the stairs instead of the elevator.  Take a walk during your lunch break.  If you drive, park your car farther away from work or school.  If you take public transportation, get off one stop early and walk the rest of the way.  Make all of your phone calls while standing up and walking around.  Get up, stretch, and walk around every 30 minutes throughout the day. What guidelines should I follow while exercising?  Do not exercise so much that you hurt yourself, feel dizzy, or get very short of breath.  Consult your health care provider prior to starting a new exercise program.  Wear comfortable clothes and shoes with good support.  Drink plenty of water while you exercise to prevent dehydration or heat stroke. Body water is lost during exercise and must be replaced.  Work out until you breathe faster and your heart beats faster. This information is not intended to replace advice given to you by your health care provider. Make sure you discuss any questions you have with your health care provider. Document Released: 10/09/2010 Document Revised: 02/12/2016 Document Reviewed: 02/07/2014 Elsevier Interactive Patient Education  [employer]2018 Elsevier Inc.

## 2018-05-09 NOTE — Progress Notes (Signed)
Subjective:     Patient ID: Kelli Stout, female   DOB: [birth year], 52 y.o.   MRN: [id number]  HPI Patient seen for physical exam. She has history of [facility]. She plans to get flu vaccine later this year. She's taken on a job at [employer] and is very happy with her current work. She had [facility] and these were reviewed and stable  She's lost 30 pounds due to her efforts. She has stopped taking amlodipine and blood pressures been stable since her weight loss. No history of shingles vaccine.  She sees gynecologist regularly. Colonoscopy up-to-date. Will need tetanus booster by next year  Family history reviewed. Mother had coronary disease in her 7550s.  Past Medical History:  Diagnosis Date  . Anxiety   . Chicken pox   . Depression   . HIV infection (HCC)   . Hypertension   . UTI (urinary tract infection)    Past Surgical History:  Procedure Laterality Date  . ABDOMINAL HYSTERECTOMY  1989   partial    reports that she has never smoked. She has never used smokeless tobacco. She reports that she does not drink alcohol or use drugs. family history includes Arthritis in her maternal grandmother; Cancer in her maternal grandmother; Cancer (age of onset: 9217) in her son; Colon cancer (age of onset: 4870) in her maternal aunt; Diabetes in her father; Heart disease in her maternal uncle and mother; Hyperlipidemia in her mother; Hypertension in her father and mother; Mental illness in her mother. No Known Allergies   Review of Systems  Constitutional: Negative for fatigue and unexpected weight change.  Eyes: Negative for visual disturbance.  Respiratory: Negative for cough, chest tightness, shortness of breath and wheezing.   Cardiovascular: Negative for chest pain, palpitations and leg swelling.  Endocrine: Negative for polydipsia and polyuria.  Neurological: Negative for dizziness, seizures, syncope,  weakness, light-headedness and headaches.       Objective:   Physical Exam  Constitutional: She is oriented to person, place, and time. She appears well-developed and well-nourished.  HENT:  Head: Normocephalic and atraumatic.  Eyes: Pupils are equal, round, and reactive to light. EOM are normal.  Neck: Normal range of motion. Neck supple. No thyromegaly present.  Cardiovascular: Normal rate, regular rhythm and normal heart sounds.  No murmur heard. Pulmonary/Chest: Effort normal and breath sounds normal. No respiratory distress. She has no wheezes. She has no rales.  Abdominal: Soft. Bowel sounds are normal. She exhibits no distension and no mass. There is no tenderness. There is no rebound and no guarding.  Genitourinary:  Genitourinary Comments: Per gyn  Musculoskeletal: Normal range of motion. She exhibits no edema.  Lymphadenopathy:    She has no cervical adenopathy.  Neurological: She is alert and oriented to person, place, and time. She displays normal reflexes. No cranial nerve deficit.  Skin: No rash noted.  Psychiatric: She has a normal mood and affect. Her behavior is normal. Judgment and thought content normal.       Assessment:     Physical exam. The following issues were addressed as below    Plan:     -Discussed shingles vaccine and she is placed on waiting list -We elected not to get any labs base that she [facility] -Continue weight loss efforts. She would like to lose at least 30 more pounds. -Discontinue Norvasc -Reminder for flu vaccine later this fall  Kelli Stout W  Kelli Bellucci MD [facility] at [facility]

## 2018-06-25 ENCOUNTER — Other Ambulatory Visit: Payer: Self-pay | Admitting: Family Medicine

## 2018-07-04 ENCOUNTER — Other Ambulatory Visit: Payer: Self-pay | Admitting: Family Medicine

## 2018-07-10 ENCOUNTER — Telehealth: Payer: Self-pay | Admitting: Family Medicine

## 2018-07-10 ENCOUNTER — Other Ambulatory Visit: Payer: Self-pay

## 2018-07-10 MED ORDER — TRIAMTERENE-HCTZ 37.5-25 MG PO TABS: 0.5000 | ORAL_TABLET | Freq: Every day | ORAL | 1 refills | Status: DC

## 2018-07-10 NOTE — Telephone Encounter (Signed)
She has taken this in past   Not sure why not on her list, but if she has been taking- we need to add back and refill for 6 months.

## 2018-07-10 NOTE — Telephone Encounter (Signed)
Called patient and left a brief voice message to let her know that her request has been sent.

## 2018-07-10 NOTE — Telephone Encounter (Signed)
Patient is asking for a call back asap.  She is out of medication and has no refills.  The medication isn't on her medication list.  [id number]  Medication needing refilled:  Triamterene 37.5 mg/Hctz 25 mg  1/2 tablet 1 time daily  Pharmacy:  [facility] [address] in [place]

## 2018-07-10 NOTE — Telephone Encounter (Signed)
Last OV 05/09/18, No future OV  Please advise if patient is on this medication and okay to fill.

## 2018-09-01 HISTORY — PX: TOE SURGERY: SHX1073

## 2018-10-03 ENCOUNTER — Ambulatory Visit: Payer: BC Managed Care – PPO | Admitting: Family Medicine

## 2018-10-03 ENCOUNTER — Other Ambulatory Visit: Payer: Self-pay

## 2018-10-03 ENCOUNTER — Encounter: Payer: Self-pay | Admitting: Family Medicine

## 2018-10-03 VITALS — BP 134/84 | HR 95 | Temp 98.3°F | Ht 68.0 in | Wt 243.6 lb

## 2018-10-03 DIAGNOSIS — J069 Acute upper respiratory infection, unspecified: Secondary | ICD-10-CM

## 2018-10-03 DIAGNOSIS — S30810A Abrasion of lower back and pelvis, initial encounter: Secondary | ICD-10-CM | POA: Diagnosis not present

## 2018-10-03 DIAGNOSIS — B9789 Other viral agents as the cause of diseases classified elsewhere: Secondary | ICD-10-CM | POA: Diagnosis not present

## 2018-10-03 NOTE — Progress Notes (Signed)
  Subjective:     Patient ID: Kelli Stout, female   DOB: [birth year], 53 y.o.   MRN: [id number]  HPI Patient is seen for the following issues  Onset of cough about 1 week ago.  Some drainage postnasally.  No fever.  Onset when she was in [place].  Symptoms worse at night.  She did take an equivalent of Robitussin-DM over-the-counter without much relief.  Denies any dyspnea.  No wheezing.  Second issue is slightly tender "cystic" type lesion (per patient) she states on her buttocks between the cheeks near the midline.  Just noted a couple days ago.  No fevers. No injury.  Past Medical History:  Diagnosis Date  . Anxiety   . Chicken pox   . Depression   . HIV infection (HCC)   . Hypertension   . UTI (urinary tract infection)    Past Surgical History:  Procedure Laterality Date  . ABDOMINAL HYSTERECTOMY  1989   partial  . TOE SURGERY  09/01/2018   Left foot    reports that she has never smoked. She has never used smokeless tobacco. She reports that she does not drink alcohol or use drugs. family history includes Arthritis in her maternal grandmother; Cancer in her maternal grandmother; Cancer (age of onset: 65) in her son; Colon cancer (age of onset: 36) in her maternal aunt; Diabetes in her father; Heart disease in her maternal uncle and mother; Hyperlipidemia in her mother; Hypertension in her father and mother; Mental illness in her mother. No Known Allergies   Review of Systems  Constitutional: Negative for chills and fever.  HENT: Positive for postnasal drip.   Respiratory: Positive for cough. Negative for shortness of breath and wheezing.        Objective:   Physical Exam HENT:     Head:     Comments: No cerumen left canal.  Right canal and eardrum are normal    Mouth/Throat:     Pharynx: No oropharyngeal exudate or posterior oropharyngeal erythema.  Cardiovascular:     Rate and Rhythm: Normal rate and regular rhythm.  Pulmonary:     Effort: Pulmonary effort is  normal.     Breath sounds: Normal breath sounds. No wheezing or rales.  Skin:    Comments: She has superficial abrasion left buttock.  No cellulitis changes.  No abscess.  Non-tender.        Assessment:     #1 cough.  Suspect acute viral process  #2 superficial abrasion left buttock without signs of secondary infection.    Plan:     -keep abrasion clean with soap and water. -symptomatic treatment for cough.  We explained acute bronchitis usually viral and self limited.  Follow up for any fever or persistent cough  Kristian Covey MD [facility]

## 2018-10-03 NOTE — Patient Instructions (Signed)
Cough, Adult  Coughing is a reflex that clears your throat and your airways. Coughing helps to heal and protect your lungs. It is normal to cough occasionally, but a cough that happens with other symptoms or lasts a long time may be a sign of a condition that needs treatment. A cough may last only 2-3 weeks (acute), or it may last longer than 8 weeks (chronic). What are the causes? Coughing is commonly caused by:  Breathing in substances that irritate your lungs.  A viral or bacterial respiratory infection.  Allergies.  Asthma.  Postnasal drip.  Smoking.  Acid backing up from the stomach into the esophagus (gastroesophageal reflux).  Certain medicines.  Chronic lung problems, including COPD (or rarely, lung cancer).  Other medical conditions such as heart failure. Follow these instructions at home: Pay attention to any changes in your symptoms. Take these actions to help with your discomfort:  Take medicines only as told by your health care provider. ? If you were prescribed an antibiotic medicine, take it as told by your health care provider. Do not stop taking the antibiotic even if you start to feel better. ? Talk with your health care provider before you take a cough suppressant medicine.  Drink enough fluid to keep your urine clear or pale yellow.  If the air is dry, use a cold steam vaporizer or humidifier in your bedroom or your home to help loosen secretions.  Avoid anything that causes you to cough at work or at home.  If your cough is worse at night, try sleeping in a semi-upright position.  Avoid cigarette smoke. If you smoke, quit smoking. If you need help quitting, ask your health care provider.  Avoid caffeine.  Avoid alcohol.  Rest as needed. Contact a health care provider if:  You have new symptoms.  You cough up pus.  Your cough does not get better after 2-3 weeks, or your cough gets worse.  You cannot control your cough with suppressant  medicines and you are losing sleep.  You develop pain that is getting worse or pain that is not controlled with pain medicines.  You have a fever.  You have unexplained weight loss.  You have night sweats. Get help right away if:  You cough up blood.  You have difficulty breathing.  Your heartbeat is very fast. This information is not intended to replace advice given to you by your health care provider. Make sure you discuss any questions you have with your health care provider. Document Released: 03/05/2011 Document Revised: 02/12/2016 Document Reviewed: 11/13/2014 Elsevier Interactive Patient Education  2019 Elsevier Inc.  

## 2018-10-14 ENCOUNTER — Other Ambulatory Visit: Payer: Self-pay | Admitting: Family Medicine

## 2019-03-22 ENCOUNTER — Other Ambulatory Visit: Payer: Self-pay

## 2019-03-22 ENCOUNTER — Encounter: Payer: Self-pay | Admitting: Family Medicine

## 2019-03-22 ENCOUNTER — Ambulatory Visit (INDEPENDENT_AMBULATORY_CARE_PROVIDER_SITE_OTHER): Payer: BC Managed Care – PPO | Admitting: Family Medicine

## 2019-03-22 DIAGNOSIS — R300 Dysuria: Secondary | ICD-10-CM

## 2019-03-22 MED ORDER — NITROFURANTOIN MONOHYD MACRO 100 MG PO CAPS: 100.0000 mg | ORAL_CAPSULE | Freq: Two times a day (BID) | ORAL | 0 refills | Status: DC

## 2019-03-22 NOTE — Patient Instructions (Signed)
-  take the antibiotic Macrobid as prescribed  -Please drink plenty of water  I hope you are feeling better soon! Seek care promptly if your symptoms worsen, new concerns arise or you are not improving with treatment.

## 2019-03-22 NOTE — Progress Notes (Signed)
Virtual Visit via Video Note  I connected with   on 03/22/19 at  3:40 PM EDT by a video enabled telemedicine application and verified that I am speaking with the correct person using two identifiers.  Location patient: home Location provider:work or home office Persons participating in the virtual visit: patient, provider  Acute visit for possible UTI: -started 3 days ago -symptoms include odor to urine, frequency, urgency, had mild low back pain -denies: fevers, vomiting, diarrhea, flank pain, hematuria, vaginal symptoms, pelvic/abd pain -she has had several UTIs in the past and reports this feels the same -is in [place] working and is requesting abx empiric treatment -thinks has taken macrobid in the past and did well -denies sexual activity  I discussed the limitations of evaluation and management by telemedicine and the availability of in person appointments. The patient expressed understanding and agreed to proceed.   HPI:    ROS: See pertinent positives and negatives per HPI.  Past Medical History:  Diagnosis Date  . Anxiety   . Chicken pox   . Depression   . HIV infection ([place])   . Hypertension   . UTI (urinary tract infection)     Past Surgical History:  Procedure Laterality Date  . ABDOMINAL HYSTERECTOMY  1989   partial  . TOE SURGERY  09/01/2018   Left foot    Family History  Problem Relation Age of Onset  . Hyperlipidemia Mother   . Heart disease Mother   . Hypertension Mother   . Mental illness Mother   . Hypertension Father   . Diabetes Father        type ll  . Colon cancer Maternal Aunt 75  . Heart disease Maternal Uncle   . Arthritis Maternal Grandmother   . Cancer Maternal Grandmother        bladder  . Cancer Son 82       osteosarcoma    SOCIAL HX: see hpi    Current Outpatient Medications:  .  rilpivirine (ENDURANT) 25 MG TABS tablet, Take 25 mg by mouth daily., Disp: , Rfl:  .  solifenacin (VESICARE) 5 MG tablet, Take 1 tablet (5  mg total) by mouth daily., Disp: 90 tablet, Rfl: 0 .  triamterene-hydrochlorothiazide (MAXZIDE-25) 37.5-25 MG tablet, TAKE 1/2 TABLET BY MOUTH ONCE DAILY, Disp: 45 tablet, Rfl: 1 .  TRUVADA 200-300 MG per tablet, Take 1 tablet by mouth daily. Per Dr Chrissie Noa, Disp: , Rfl:  .  valACYclovir (VALTREX) 500 MG tablet, Take 500 mg by mouth daily., Disp: , Rfl:  .  venlafaxine XR (EFFEXOR-XR) 150 MG 24 hr capsule, TAKE ONE CAPSULE BY MOUTH DAILY, Disp: 90 capsule, Rfl: 0 .  nitrofurantoin, macrocrystal-monohydrate, (MACROBID) 100 MG capsule, Take 1 capsule (100 mg total) by mouth 2 (two) times daily., Disp: 14 capsule, Rfl: 0  EXAM:  VITALS per patient if applicable: denies fever  GENERAL: alert, oriented, appears well and in no acute distress  HEENT: atraumatic, conjunttiva clear, no obvious abnormalities on inspection of external nose and ears  NECK: normal movements of the head and neck  LUNGS: on inspection no signs of respiratory distress, breathing rate appears normal, no obvious gross SOB, gasping or wheezing  CV: no obvious cyanosis  ABD: denies pain  MS: moves all visible extremities without noticeable abnormality  PSYCH/NEURO: pleasant and cooperative, no obvious depression or anxiety, speech and thought processing grossly intact  ASSESSMENT AND PLAN:  Discussed the following assessment and plan:  Dysuria -  Discussed potential etiologies, options  for evaluation, treatment options, risks and precautions. Suspected UTI and she prefers empiric treatment as reports has had these before and knows her body and prefers to avoid office visit if possible. Agrees to seek care if any worsening, new concerns or if symptoms are not resolving over the next few days with treatment.   I discussed the assessment and treatment plan with the patient. The patient was provided an opportunity to ask questions and all were answered. The patient agreed with the plan and demonstrated an understanding  of the instructions.   The patient was advised to call back or seek an in-person evaluation if the symptoms worsen or if the condition fails to improve as anticipated.   Terressa KoyanagiHannah R Dontravious Camille, DO

## 2019-04-05 ENCOUNTER — Other Ambulatory Visit: Payer: Self-pay

## 2019-04-05 ENCOUNTER — Ambulatory Visit (INDEPENDENT_AMBULATORY_CARE_PROVIDER_SITE_OTHER): Payer: BC Managed Care – PPO | Admitting: Family Medicine

## 2019-04-05 DIAGNOSIS — Z20822 Contact with and (suspected) exposure to covid-19: Secondary | ICD-10-CM

## 2019-04-05 DIAGNOSIS — Z20828 Contact with and (suspected) exposure to other viral communicable diseases: Secondary | ICD-10-CM | POA: Diagnosis not present

## 2019-04-05 MED ORDER — HYDROCODONE-HOMATROPINE 5-1.5 MG/5ML PO SYRP: 5.0000 mL | ORAL_SOLUTION | Freq: Four times a day (QID) | ORAL | 0 refills | Status: AC | PRN

## 2019-04-05 NOTE — Progress Notes (Signed)
Patient ID: Kelli Stout, female   DOB: Jul 14, 1966, 53 y.o.   MRN: [id number]  This visit type was conducted due to national recommendations for restrictions regarding the COVID-19 pandemic in an effort to limit this patient's exposure and mitigate transmission in our community.   Virtual Visit via Telephone Note  I connected with Kelli Stout on 04/05/19 at 11:00 AM EDT by telephone and verified that I am speaking with the correct person using two identifiers.   I discussed the limitations, risks, security and privacy concerns of performing an evaluation and management service by telephone and the availability of in person appointments. I also discussed with the patient that there may be a patient responsible charge related to this service. The patient expressed understanding and agreed to proceed.  Location patient: home Location provider: work or home office Participants present for the call: patient, provider Patient did not have a visit in the prior 7 days to address this/these issue(s).   History of Present Illness:  Patient called to discuss recent coronavirus infection.  She developed symptoms around July 1 of some mild cough, body aches, headaches, and loss of taste.  She apparently had some urinary symptoms as well initially thought this was a UTI.  She went to [facility] and on 6 July tested positive for COVID  She had some mild dyspnea.  Her fever only lasted a few days and she has now been afebrile for over a week.  She does have some worsening cough but no dyspnea.  Her other symptoms such as body aches, headache, loss of taste have resolved.  She works at [employer] and thinks she may have been exposed there.  Her work is apparently requiring a note to return.   Observations/Objective: Patient sounds cheerful and well on the phone. I do not appreciate any SOB. Speech and thought processing are grossly intact. Patient reported vitals: not reported.  Assessment  and Plan:  Recent coronavirus infection-overall improving symptomatically but patient has some increased cough at night.  She is requesting cough medication.  We discussed the following  -We will send in a small prescription for Hycodan to use at night for severe cough -We explained in terms of return to work that there are 2 basic strategies one being symptom based and 1 being test based.  She thinks her employer is requiring a negative test though we explained that most test based strategies require two negative tests greater than 24 hours apart. Nevertheless, based on symptoms she is over 72 hours afebrile and well past 10 days from initial onset of symptoms.  We reviewed recent CDC guidelines regarding return to work following positive COVID infection  Follow Up Instructions:  -Follow-up promptly for any recurrent fever, shortness of breath, or other concerns   99441 5-10 99442 11-20 99443 21-30 I did not refer this patient for an OV in the next 24 hours for this/these issue(s).  I discussed the assessment and treatment plan with the patient. The patient was provided an opportunity to ask questions and all were answered. The patient agreed with the plan and demonstrated an understanding of the instructions.   The patient was advised to call back or seek an in-person evaluation if the symptoms worsen or if the condition fails to improve as anticipated.  I provided 22 minutes of non-face-to-face time during this encounter.   Carolann Littler, MD

## 2019-04-11 LAB — NOVEL CORONAVIRUS, NAA: SARS-CoV-2, NAA: NOT DETECTED

## 2019-04-13 ENCOUNTER — Telehealth: Payer: Self-pay | Admitting: Family Medicine

## 2019-04-13 ENCOUNTER — Encounter: Payer: Self-pay | Admitting: Family Medicine

## 2019-04-13 NOTE — Telephone Encounter (Signed)
Please see message about letter from [facility]

## 2019-04-13 NOTE — Telephone Encounter (Signed)
Patient needs a letter for work stating that she was out of work from the dates of 03/26/2019-04/13/2019. Stating that it is okay for her to return to work on Monday 04/16/2019. With no restrictions. Please let the patient know if you are able to write the letter and when it is completed so she can get it sent to her job.

## 2019-04-13 NOTE — Telephone Encounter (Signed)
Signed letter has been given to [place] to scan and send to [employer].  Called patient and left a voice message to let her know that the letter will be sent to her MyChart.

## 2019-04-13 NOTE — Telephone Encounter (Signed)
Letter done

## 2019-04-16 ENCOUNTER — Other Ambulatory Visit: Payer: Self-pay | Admitting: Family Medicine

## 2019-07-07 ENCOUNTER — Encounter: Payer: Self-pay | Admitting: Family Medicine

## 2019-07-24 ENCOUNTER — Other Ambulatory Visit: Payer: Self-pay | Admitting: Family Medicine

## 2019-07-24 MED ORDER — TRIAMTERENE-HCTZ 37.5-25 MG PO TABS: 0.5000 | ORAL_TABLET | Freq: Every day | ORAL | 0 refills | Status: DC

## 2019-07-24 NOTE — Telephone Encounter (Signed)
Pt needs 30 day Rx   triamterene-hydrochlorothiazide (SVXBLTJ-03) 37.5-25 MG tablet   To get her through to her appt for cpe on 08/06/2019  [id number] - [place], [place] [phone] (Phone) [phone] (Fax)

## 2019-07-24 NOTE — Telephone Encounter (Signed)
Requested medication (s) are due for refill today: yes  Requested medication (s) are on the active medication list: yes  Last refill:  04/16/2019  Future visit scheduled: yes  Notes to clinic:  Patient would like enough to get to her appointment on 08/06/2019   Requested Prescriptions  Pending Prescriptions Disp Refills   triamterene-hydrochlorothiazide (MAXZIDE-25) 37.5-25 MG tablet 45 tablet 0    Sig: Take 0.5 tablets by mouth daily.     Cardiovascular: Diuretic Combos Failed - 07/24/2019  1:30 PM      Failed - K in normal range and within 360 days    Potassium  Date Value Ref Range Status  09/19/2012 3.7 3.5 - 5.1 mEq/L Final         Failed - Na in normal range and within 360 days    Sodium  Date Value Ref Range Status  09/19/2012 140 135 - 145 mEq/L Final         Failed - Cr in normal range and within 360 days    Creatinine, Ser  Date Value Ref Range Status  09/19/2012 1.10 0.50 - 1.10 mg/dL Final         Failed - Ca in normal range and within 360 days    Calcium  Date Value Ref Range Status  06/07/2012 8.7 8.4 - 10.5 mg/dL Final         Passed - Last BP in normal range    BP Readings from Last 1 Encounters:  10/03/18 134/84         Passed - Valid encounter within last 6 months    Recent Outpatient Visits          3 months ago Close Exposure to [place] at [place], MD   4 months ago [place] at [facility], [place] R, DO   9 months ago Viral URI with cough   Therapist, music at [facility], Alinda Sierras, MD   1 year ago Physical exam   Therapist, music at [facility], Alinda Sierras, MD   2 years ago [place] at [facility], Alinda Sierras, MD      Future Appointments            In 1 week [place], Alinda Sierras, MD [place] [facility] [place], [facility]

## 2019-08-06 ENCOUNTER — Encounter: Payer: Self-pay | Admitting: Family Medicine

## 2019-08-06 ENCOUNTER — Other Ambulatory Visit: Payer: Self-pay

## 2019-08-06 ENCOUNTER — Ambulatory Visit (INDEPENDENT_AMBULATORY_CARE_PROVIDER_SITE_OTHER): Payer: BC Managed Care – PPO | Admitting: Family Medicine

## 2019-08-06 VITALS — BP 128/78 | HR 85 | Temp 97.3°F | Ht 67.0 in | Wt 237.2 lb

## 2019-08-06 DIAGNOSIS — Z0000 Encounter for general adult medical examination without abnormal findings: Secondary | ICD-10-CM | POA: Diagnosis not present

## 2019-08-06 DIAGNOSIS — Z23 Encounter for immunization: Secondary | ICD-10-CM | POA: Diagnosis not present

## 2019-08-06 NOTE — Progress Notes (Signed)
Subjective:     Patient ID: Kelli Stout, female   DOB: January 06, 1966, 53 y.o.   MRN: [id number]  HPI   Kelli Stout is seen for physical exam.  She sees gynecologist yearly.  Her chronic problems include history of urinary urgency, hypertension, HIV.  She has done tremendous job with weight loss and has made some dietary changes mostly with restricting portion sizes and lost about 20 pounds.  She is followed regularly by infectious disease.  Her HIV apparently stable.  She hopes to get < 200.    Health maintenance reviewed.  She needs flu vaccine.  Also is requesting shingles vaccine.  She also needs tetanus booster but would like to defer and focus on flu vaccine and shingles first.  No history of smoking.  She continues to work at [employer].  She is considering possible move back to [facility] for work within the next few years.  Past Medical History:  Diagnosis Date  . Anxiety   . Chicken pox   . Depression   . HIV infection (HCC)   . Hypertension   . UTI (urinary tract infection)    Past Surgical History:  Procedure Laterality Date  . ABDOMINAL HYSTERECTOMY  1989   partial  . TOE SURGERY  09/01/2018   Left foot    reports that she has never smoked. She has never used smokeless tobacco. She reports that she does not drink alcohol or use drugs. family history includes Arthritis in her maternal grandmother; Cancer in her maternal grandmother; Cancer (age of onset: 4) in her son; Colon cancer (age of onset: 25) in her maternal aunt; Diabetes in her father; Heart disease in her maternal uncle and mother; Hyperlipidemia in her mother; Hypertension in her father and mother; Mental illness in her mother. No Known Allergies   Review of Systems  Constitutional: Negative for activity change, appetite change, chills, fatigue, fever and unexpected weight change.  HENT: Negative for ear pain, hearing loss, sore throat and trouble swallowing.   Eyes: Negative for visual disturbance.   Respiratory: Negative for cough and shortness of breath.   Cardiovascular: Negative for chest pain and palpitations.  Gastrointestinal: Negative for abdominal pain, blood in stool, constipation and diarrhea.  Genitourinary: Negative for dysuria and hematuria.  Musculoskeletal: Negative for arthralgias, back pain and myalgias.  Skin: Negative for rash.  Neurological: Negative for dizziness, syncope and headaches.  Hematological: Negative for adenopathy.  Psychiatric/Behavioral: Negative for confusion and dysphoric mood.       Objective:   Physical Exam Constitutional:      Appearance: She is well-developed.  HENT:     Head: Normocephalic and atraumatic.  Eyes:     Pupils: Pupils are equal, round, and reactive to light.  Neck:     Musculoskeletal: Normal range of motion and neck supple.     Thyroid: No thyromegaly.  Cardiovascular:     Rate and Rhythm: Normal rate and regular rhythm.     Heart sounds: Normal heart sounds. No murmur.  Pulmonary:     Effort: No respiratory distress.     Breath sounds: Normal breath sounds. No wheezing or rales.  Abdominal:     General: Bowel sounds are normal. There is no distension.     Palpations: Abdomen is soft. There is no mass.     Tenderness: There is no abdominal tenderness. There is no guarding or rebound.  Musculoskeletal: Normal range of motion.  Lymphadenopathy:     Cervical: No cervical adenopathy.  Skin:  Findings: No rash.  Neurological:     Mental Status: She is alert and oriented to person, place, and time.     Cranial Nerves: No cranial nerve deficit.     Deep Tendon Reflexes: Reflexes normal.  Psychiatric:        Behavior: Behavior normal.        Thought Content: Thought content normal.        Judgment: Judgment normal.        Assessment:     Physical exam.  Stable chronic problems as above.  She has done excellent job with weight loss during the past year.  We discussed the following health maintenance issues     Plan:     -No recent labs and we recommend she schedule fasting labs which she will come in for next Monday -Flu vaccine given -Shingles vaccine given and reminder for booster in 2 to 6 months -Continue weight loss efforts -Consider tetanus booster at next follow-up visit  Eulas Post MD [facility] at [facility]

## 2019-08-06 NOTE — Patient Instructions (Signed)

## 2019-08-10 ENCOUNTER — Other Ambulatory Visit: Payer: Self-pay

## 2019-08-13 ENCOUNTER — Other Ambulatory Visit: Payer: BC Managed Care – PPO

## 2019-08-20 ENCOUNTER — Other Ambulatory Visit: Payer: BC Managed Care – PPO

## 2019-08-30 ENCOUNTER — Ambulatory Visit: Payer: Self-pay | Admitting: *Deleted

## 2019-08-30 NOTE — Telephone Encounter (Signed)
Patient calls with "really bad" headache Monday night. Has eased off but lingering. Facial sinus pressure near her eyes. Body aches and occasional chills. Denies fever/SOB/cough. Inquiring should she get Covid19 tested/flu test. Was Covid positive in July. 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 Advised quearantine until getting results from both test. Encourage fluids and rest. If develops other sxs/difficulty breathing call back for virtual appointment. Stated she would go for testing.  Reason for Disposition . [1] Probable mild influenza (no fever) or a common cold, with no complications AND [2] NOT HIGH RISK  Answer Assessment - Initial Assessment Questions 1. WORST SYMPTOM: "What is your worst symptom?" (e.g., cough, runny nose, muscle aches, headache, sore throat, fever)     Headache and body aches 2. ONSET: "When did your flu symptoms start?"     Monday night 3. COUGH: "How bad is the cough?"       No cough 4. RESPIRATORY DISTRESS: "Describe your breathing."     No difficulty breathing-normal  5. FEVER: "Do you have a fever?" If so, ask: "What is your temperature, how was it measured, and when did it start?"     No but some chills 6. EXPOSURE: "Were you exposed to someone with influenza?"       no 7. FLU VACCINE: "Did you get a flu shot this year?"     no 8. HIGH RISK DISEASE: "Do you any chronic medical problems?" (e.g., heart or lung disease, asthma, weak immune system, or other HIGH RISK conditions)     unknown 9. PREGNANCY: "Is there any chance you are pregnant?" "When was your last menstrual period?"     na 10. OTHER SYMPTOMS: "Do you have any other symptoms?"  (e.g., runny nose, muscle aches, headache, sore throat)       Body aches, headaches  Protocols used: INFLUENZA - SEASONAL-A-AH

## 2019-08-31 ENCOUNTER — Telehealth: Payer: Self-pay | Admitting: Family Medicine

## 2019-08-31 ENCOUNTER — Other Ambulatory Visit: Payer: Self-pay

## 2019-08-31 DIAGNOSIS — Z20822 Contact with and (suspected) exposure to covid-19: Secondary | ICD-10-CM

## 2019-08-31 NOTE — Telephone Encounter (Signed)
Paperwork placed in red folder on desk. Please return when complete.

## 2019-08-31 NOTE — Telephone Encounter (Signed)
FMLA paperwork to be filled out-- placed in dr's folder.  Fax to [phone] and call to let patient know it has been faxed.

## 2019-09-01 LAB — NOVEL CORONAVIRUS, NAA: SARS-CoV-2, NAA: NOT DETECTED

## 2019-09-03 ENCOUNTER — Telehealth (INDEPENDENT_AMBULATORY_CARE_PROVIDER_SITE_OTHER): Payer: BC Managed Care – PPO | Admitting: Family Medicine

## 2019-09-03 ENCOUNTER — Other Ambulatory Visit: Payer: Self-pay

## 2019-09-03 DIAGNOSIS — R519 Headache, unspecified: Secondary | ICD-10-CM | POA: Diagnosis not present

## 2019-09-03 NOTE — Progress Notes (Signed)
This visit type was conducted due to national recommendations for restrictions regarding the COVID-19 pandemic in an effort to limit this patient's exposure and mitigate transmission in our community.   Virtual Visit via Video Note  I connected with Kelli Stout on 09/03/19 at  8:45 AM EST by a video enabled telemedicine application and verified that I am speaking with the correct person using two identifiers.  Location patient: home Location provider:work or home office Persons participating in the virtual visit: patient, provider  I discussed the limitations of evaluation and management by telemedicine and the availability of in person appointments. The patient expressed understanding and agreed to proceed.   HPI: Kelli Stout had requested medical leave to be completed late last week.  We had not seen her for acute issue and so we set up this visit today to address.  She states she has been having daily diffuse bilateral headaches which have been fairly severe.  She has had some nondescript diffuse abdominal pain as well.  No fever.  No nasal congestion.  No cough.  No dyspnea.  She went for Covid testing the 11th which came back negative.  She states she is having tremendous job stress and she thinks this is related to her current headaches.  No history of migraine.  No fevers or chills.  No stiff neck.  She is taken some over-the-counter analgesics with temporary mild improvement.   ROS: See pertinent positives and negatives per HPI.  Past Medical History:  Diagnosis Date  . Anxiety   . Chicken pox   . Depression   . HIV infection (HCC)   . Hypertension   . UTI (urinary tract infection)     Past Surgical History:  Procedure Laterality Date  . ABDOMINAL HYSTERECTOMY  1989   partial  . TOE SURGERY  09/01/2018   Left foot    Family History  Problem Relation Age of Onset  . Hyperlipidemia Mother   . Heart disease Mother   . Hypertension Mother   . Mental illness Mother   .  Hypertension Father   . Diabetes Father        type ll  . Colon cancer Maternal Aunt 70  . Heart disease Maternal Uncle   . Arthritis Maternal Grandmother   . Cancer Maternal Grandmother        bladder  . Cancer Son 55       osteosarcoma    SOCIAL HX: Non-smoker   Current Outpatient Medications:  .  estrogens, conjugated, (PREMARIN) 0.3 MG tablet, TAKE 1 TABLET BY MOUTH DAILY, Disp: , Rfl:  .  oxybutynin (DITROPAN-XL) 10 MG 24 hr tablet, Take by mouth., Disp: , Rfl:  .  rilpivirine (ENDURANT) 25 MG TABS tablet, Take 25 mg by mouth daily., Disp: , Rfl:  .  solifenacin (VESICARE) 5 MG tablet, Take 1 tablet (5 mg total) by mouth daily. (Patient not taking: Reported on 08/06/2019), Disp: 90 tablet, Rfl: 0 .  triamterene-hydrochlorothiazide (MAXZIDE-25) 37.5-25 MG tablet, Take 0.5 tablets by mouth daily., Disp: 45 tablet, Rfl: 0 .  TRUVADA 200-300 MG per tablet, Take 1 tablet by mouth daily. Per Dr Corine Shelter, Disp: , Rfl:  .  valACYclovir (VALTREX) 500 MG tablet, Take 500 mg by mouth daily., Disp: , Rfl:  .  venlafaxine XR (EFFEXOR-XR) 150 MG 24 hr capsule, TAKE ONE CAPSULE BY MOUTH DAILY, Disp: 90 capsule, Rfl: 0  EXAM:  VITALS per patient if applicable:  GENERAL: alert, oriented, appears well and in no acute distress  HEENT: atraumatic, conjunttiva clear, no obvious abnormalities on inspection of external nose and ears  NECK: normal movements of the head and neck  LUNGS: on inspection no signs of respiratory distress, breathing rate appears normal, no obvious gross SOB, gasping or wheezing  CV: no obvious cyanosis  MS: moves all visible extremities without noticeable abnormality  PSYCH/NEURO: pleasant and cooperative, no obvious depression or anxiety, speech and thought processing grossly intact  ASSESSMENT AND PLAN:  Discussed the following assessment and plan:  Diffuse bilateral headaches.  Suspect acute tension type headache  -We discussed conservative treatment with  things like heat, exercise, avoidance of regular analgesics -Papers completed for work excuse.  Her first day out of work was 08/28/2019. -She will return for an office visit this Wednesday to reassess     I discussed the assessment and treatment plan with the patient. The patient was provided an opportunity to ask questions and all were answered. The patient agreed with the plan and demonstrated an understanding of the instructions.   The patient was advised to call back or seek an in-person evaluation if the symptoms worsen or if the condition fails to improve as anticipated.    Carolann Littler, MD

## 2019-09-05 ENCOUNTER — Ambulatory Visit: Payer: BC Managed Care – PPO | Admitting: Family Medicine

## 2019-09-05 ENCOUNTER — Encounter: Payer: Self-pay | Admitting: Family Medicine

## 2019-09-05 ENCOUNTER — Other Ambulatory Visit: Payer: Self-pay

## 2019-09-05 VITALS — BP 132/82 | HR 89 | Temp 97.0°F | Ht 67.0 in | Wt 234.1 lb

## 2019-09-05 DIAGNOSIS — R519 Headache, unspecified: Secondary | ICD-10-CM | POA: Diagnosis not present

## 2019-09-05 NOTE — Progress Notes (Signed)
  Subjective:     Patient ID: Kelli Stout, female   DOB: May 14, 1966, 53 y.o.   MRN: [id number]  HPI   Torsha is here basically needing a work release.  Refer to previous visit from a couple days ago.  She was having diffuse bilateral headaches and we suspected probable tension type headaches.  Tremendous job stress recently.  She had Covid testing on the 11th which came back negative.  Her headaches are really unchanged.  They are not consistent and daily but does occur most days of the week.  No history of migraines.  No recent stiff neck, fevers, or chills.  She has gotten some temporary relief with analgesics.  She feels she is sleeping fairly well.  No recent exertional headache.  Past Medical History:  Diagnosis Date  . Anxiety   . Chicken pox   . Depression   . HIV infection ([place])   . Hypertension   . UTI (urinary tract infection)    Past Surgical History:  Procedure Laterality Date  . ABDOMINAL HYSTERECTOMY  1989   partial  . TOE SURGERY  09/01/2018   Left foot    reports that she has never smoked. She has never used smokeless tobacco. She reports that she does not drink alcohol or use drugs. family history includes Arthritis in her maternal grandmother; Cancer in her maternal grandmother; Cancer (age of onset: 82) in her son; Colon cancer (age of onset: 90) in her maternal aunt; Diabetes in her father; Heart disease in her maternal uncle and mother; Hyperlipidemia in her mother; Hypertension in her father and mother; Mental illness in her mother. No Known Allergies   Review of Systems  Constitutional: Negative for chills and fever.  Respiratory: Negative for shortness of breath.   Cardiovascular: Negative for chest pain.  Neurological: Positive for headaches. Negative for dizziness, seizures, syncope, speech difficulty and weakness.  Hematological: Negative for adenopathy.  Psychiatric/Behavioral: Negative for confusion and sleep disturbance. The patient is  nervous/anxious.        Objective:   Physical Exam Vitals reviewed.  Constitutional:      Appearance: Normal appearance.  Cardiovascular:     Rate and Rhythm: Normal rate and regular rhythm.  Pulmonary:     Effort: Pulmonary effort is normal.     Breath sounds: Normal breath sounds.  Neurological:     General: No focal deficit present.     Mental Status: She is alert and oriented to person, place, and time.  Psychiatric:        Mood and Affect: Mood normal.        Behavior: Behavior normal.        Assessment:     #1 situational stress with increased job stress  #2 bilateral diffuse headaches.  Suspect chronic tension type headache    Plan:     -She is encouraged to consider counseling -Recommend nonpharmacologic measures to help reduce stress such as adequate sleep, exercise, good nutrition and try to stay engaged in hobbies -We discussed possible medication options to suppress chronic tension type headache but at this point she wishes to observe -We have given her released to return to regular work duties on 09/10/2019  Eulas Post MD [facility] at [facility]

## 2019-09-05 NOTE — Patient Instructions (Signed)
Tension Headache, Adult A tension headache is a feeling of pain, pressure, or aching in the head that is often felt over the front and sides of the head. The pain can be dull, or it can feel tight (constricting). There are two types of tension headache:  Episodic tension headache. This is when the headaches happen fewer than 15 days a month.  Chronic tension headache. This is when the headaches happen more than 15 days a month during a 3-month period. A tension headache can last from 30 minutes to several days. It is the most common kind of headache. Tension headaches are not normally associated with nausea or vomiting, and they do not get worse with physical activity. What are the causes? The exact cause of this condition is not known. Tension headaches are often triggered by stress, anxiety, or depression. Other triggers include:  Alcohol.  Too much caffeine or caffeine withdrawal.  Respiratory infections, such as colds, flu, or sinus infections.  Dental problems or teeth clenching.  Tiredness (fatigue).  Holding your head and neck in the same position for a long period of time, such as while using a computer.  Smoking.  Arthritis of the neck. What are the signs or symptoms? Symptoms of this condition include:  A feeling of pressure or tightness around the head.  Dull, aching head pain.  Pain over the front and sides of the head.  Tenderness in the muscles of the head, neck, and shoulders. How is this diagnosed? This condition may be diagnosed based on your symptoms, your medical history, and a physical exam. If your symptoms are severe or unusual, you may have imaging tests, such as a CT scan or an MRI of your head. Your vision may also be checked. How is this treated? This condition may be treated with lifestyle changes and with medicines that help relieve symptoms. Follow these instructions at home: Managing pain  Take over-the-counter and prescription medicines only as  told by your health care provider.  When you have a headache, lie down in a dark, quiet room.  If directed, apply ice to the head and neck: ? Put ice in a plastic bag. ? Place a towel between your skin and the bag. ? Leave the ice on for 20 minutes, 2-3 times a day.  If directed, apply heat to the back of your neck as often as told by your health care provider. Use the heat source that your health care provider recommends, such as a moist heat pack or a heating pad. ? Place a towel between your skin and the heat source. ? Leave the heat on for 20-30 minutes. ? Remove the heat if your skin turns bright red. This is especially important if you are unable to feel pain, heat, or cold. You may have a greater risk of getting burned. Eating and drinking  Eat meals on a regular schedule.  Limit alcohol intake to no more than 1 drink a day for nonpregnant women and 2 drinks a day for men. One drink equals 12 oz of beer, 5 oz of wine, or 1 oz of hard liquor.  Drink enough fluid to keep your urine pale yellow.  Decrease your caffeine intake, or stop using caffeine. Lifestyle  Get 7-9 hours of sleep each night, or get the amount of sleep recommended by your health care provider.  At bedtime, remove all electronic devices from your room. Electronic devices include computers, phones, and tablets.  Find ways to manage your stress. Some things   that can help relieve stress include: ? Exercise. ? Deep breathing exercises. ? Yoga. ? Listening to music. ? Positive mental imagery.  Try to sit up straight and avoid tensing your muscles.  Do not use any products that contain nicotine or tobacco, such as cigarettes and e-cigarettes. If you need help quitting, ask your health care provider. General instructions  Keep all follow-up visits as told by your health care provider. This is important.  Avoid any headache triggers. Keep a headache journal to help find out what may trigger your headaches.  For example, write down: ? What you eat and drink. ? How much sleep you get. ? Any change to your diet or medicines. Contact a health care provider if:  Your headache does not get better.  Your headache comes back.  You are sensitive to sounds, light, or smells because of a headache.  You have nausea or you vomit.  Your stomach hurts. Get help right away if:  You suddenly develop a very severe headache along with any of the following: ? A stiff neck. ? Nausea and vomiting. ? Confusion. ? Weakness. ? Double vision or loss of vision. ? Shortness of breath. ? Rash. ? Unusual sleepiness. ? Fever. ? Trouble speaking. ? Pain in your eyes or ears. ? Trouble walking or balancing. ? Feeling faint or passing out. Summary  A tension headache is a feeling of pain, pressure, or aching in the head that is often felt over the front and sides of the head.  A tension headache can last from 30 minutes to several days. It is the most common kind of headache.  This condition may be diagnosed based on your symptoms, your medical history, and a physical exam.  This condition may be treated with lifestyle changes and with medicines that help relieve symptoms. This information is not intended to replace advice given to you by your health care provider. Make sure you discuss any questions you have with your health care provider. Document Released: 09/06/2005 Document Revised: 08/19/2017 Document Reviewed: 12/17/2016 Elsevier Patient Education  2020 Elsevier Inc.  

## 2019-10-05 ENCOUNTER — Other Ambulatory Visit: Payer: Self-pay

## 2019-10-08 ENCOUNTER — Ambulatory Visit (INDEPENDENT_AMBULATORY_CARE_PROVIDER_SITE_OTHER): Payer: BC Managed Care – PPO | Admitting: *Deleted

## 2019-10-08 ENCOUNTER — Other Ambulatory Visit: Payer: Self-pay

## 2019-10-08 DIAGNOSIS — Z23 Encounter for immunization: Secondary | ICD-10-CM | POA: Diagnosis not present

## 2019-10-08 NOTE — Progress Notes (Signed)
Patient in office today for Shingles vaccine #2. Vaccine administered with no reactions.

## 2019-11-13 ENCOUNTER — Other Ambulatory Visit: Payer: Self-pay | Admitting: *Deleted

## 2019-11-13 DIAGNOSIS — I10 Essential (primary) hypertension: Secondary | ICD-10-CM

## 2019-11-13 MED ORDER — TRIAMTERENE-HCTZ 37.5-25 MG PO TABS: 0.5000 | ORAL_TABLET | Freq: Every day | ORAL | 0 refills | Status: DC

## 2019-11-26 ENCOUNTER — Other Ambulatory Visit: Payer: Self-pay

## 2019-11-27 ENCOUNTER — Other Ambulatory Visit: Payer: Self-pay

## 2019-11-27 ENCOUNTER — Encounter: Payer: Self-pay | Admitting: Family Medicine

## 2019-11-27 ENCOUNTER — Ambulatory Visit (INDEPENDENT_AMBULATORY_CARE_PROVIDER_SITE_OTHER): Payer: No Typology Code available for payment source | Admitting: Family Medicine

## 2019-11-27 VITALS — BP 122/76 | HR 78 | Temp 97.3°F | Ht 67.0 in | Wt 233.9 lb

## 2019-11-27 DIAGNOSIS — H5789 Other specified disorders of eye and adnexa: Secondary | ICD-10-CM

## 2019-11-27 NOTE — Patient Instructions (Signed)
I am putting in referral to eye doctor to further assess.

## 2019-11-27 NOTE — Progress Notes (Signed)
  Subjective:     Patient ID: Kelli Stout, female   DOB: 1966-04-26, 54 y.o.   MRN: [id number]  HPI   Kelli Stout is seen with onset around December of bilateral eye redness along with some intermittent frontal headaches and sensation of "increased eye pressure ".  She has not had an eye exam in some time.  She denies any drainage.  No blurred vision.  No eye discharge.  No pruritus.  Denies any sinusitis symptoms.  No nausea or vomiting.  She has not had any purulent nasal secretions.  Headaches usually occur once or twice per week and her somewhat temporal and frontal and usually relieved with Tylenol.  No exertional headaches.  No diplopia.  Past Medical History:  Diagnosis Date  . Anxiety   . Chicken pox   . Depression   . HIV infection (HCC)   . Hypertension   . UTI (urinary tract infection)    Past Surgical History:  Procedure Laterality Date  . ABDOMINAL HYSTERECTOMY  1989   partial  . TOE SURGERY  09/01/2018   Left foot    reports that she has never smoked. She has never used smokeless tobacco. She reports that she does not drink alcohol or use drugs. family history includes Arthritis in her maternal grandmother; Cancer in her maternal grandmother; Cancer (age of onset: 42) in her son; Colon cancer (age of onset: 30) in her maternal aunt; Diabetes in her father; Heart disease in her maternal uncle and mother; Hyperlipidemia in her mother; Hypertension in her father and mother; Mental illness in her mother. No Known Allergies   Review of Systems  Constitutional: Negative for chills and fever.  Eyes: Positive for pain and redness. Negative for discharge, itching and visual disturbance.  Cardiovascular: Negative for chest pain.  Gastrointestinal: Negative for nausea and vomiting.  Neurological: Positive for headaches. Negative for dizziness.       Objective:   Physical Exam Vitals reviewed.  Constitutional:      Appearance: She is well-developed.  Eyes:     Extraocular  Movements: Extraocular movements intact.     Pupils: Pupils are equal, round, and reactive to light.     Comments: Fundi no acute abnormalities noted on nondilated exam  Cardiovascular:     Rate and Rhythm: Normal rate and regular rhythm.  Neurological:     Mental Status: She is alert.        Assessment:     Patient relates about 13-month history of bilateral eye redness with sensation of increased eye pressure.  Intermittent headaches.  She does not have evidence to suggest allergic or infectious conjunctivitis.  Rule out glaucoma    Plan:     -Set up referral to ophthalmology will try to get in as soon as possible -Follow-up immediately for any blurred vision or other new symptoms.  We had intended to check visual acuity but she had already left  Kelli Covey MD [facility] at [facility]

## 2020-02-11 ENCOUNTER — Other Ambulatory Visit: Payer: Self-pay

## 2020-02-12 ENCOUNTER — Telehealth: Payer: Self-pay | Admitting: Family Medicine

## 2020-02-12 ENCOUNTER — Encounter: Payer: Self-pay | Admitting: Family Medicine

## 2020-02-12 ENCOUNTER — Ambulatory Visit (INDEPENDENT_AMBULATORY_CARE_PROVIDER_SITE_OTHER): Payer: BLUE CROSS/BLUE SHIELD | Admitting: Family Medicine

## 2020-02-12 VITALS — BP 136/78 | HR 74 | Temp 97.6°F | Wt 233.6 lb

## 2020-02-12 DIAGNOSIS — R55 Syncope and collapse: Secondary | ICD-10-CM | POA: Diagnosis not present

## 2020-02-12 LAB — CBC WITH DIFFERENTIAL/PLATELET
Basophils Absolute: 0 10*3/uL (ref 0.0–0.1)
Basophils Relative: 0.9 % (ref 0.0–3.0)
Eosinophils Absolute: 0.1 10*3/uL (ref 0.0–0.7)
Eosinophils Relative: 2.9 % (ref 0.0–5.0)
HCT: 38.1 % (ref 36.0–46.0)
Hemoglobin: 12.4 g/dL (ref 12.0–15.0)
Lymphocytes Relative: 42.6 % (ref 12.0–46.0)
Lymphs Abs: 1.7 10*3/uL (ref 0.7–4.0)
MCHC: 32.7 g/dL (ref 30.0–36.0)
MCV: 88.9 fl (ref 78.0–100.0)
Monocytes Absolute: 0.3 10*3/uL (ref 0.1–1.0)
Monocytes Relative: 7.4 % (ref 3.0–12.0)
Neutro Abs: 1.9 10*3/uL (ref 1.4–7.7)
Neutrophils Relative %: 46.2 % (ref 43.0–77.0)
Platelets: 323 10*3/uL (ref 150.0–400.0)
RBC: 4.28 Mil/uL (ref 3.87–5.11)
RDW: 14 % (ref 11.5–15.5)
WBC: 4.1 10*3/uL (ref 4.0–10.5)

## 2020-02-12 LAB — BASIC METABOLIC PANEL
BUN: 16 mg/dL (ref 6–23)
CO2: 32 mEq/L (ref 19–32)
Calcium: 9.7 mg/dL (ref 8.4–10.5)
Chloride: 102 mEq/L (ref 96–112)
Creatinine, Ser: 0.83 mg/dL (ref 0.40–1.20)
GFR: 85.94 mL/min (ref >60.00)
Glucose, Bld: 112 mg/dL — ABNORMAL HIGH (ref 70–99)
Potassium: 3.8 mEq/L (ref 3.5–5.1)
Sodium: 141 mEq/L (ref 135–145)

## 2020-02-12 LAB — MAGNESIUM: Magnesium: 2.1 mg/dL (ref 1.5–2.5)

## 2020-02-12 NOTE — Patient Instructions (Signed)
Syncope  Syncope refers to a condition in which a person temporarily loses consciousness. Syncope may also be called fainting or passing out. It is caused by a sudden decrease in blood flow to the brain. Even though most causes of syncope are not dangerous, syncope can be a sign of a serious medical problem. Your health care provider may do tests to find the reason why you are having syncope. Signs that you may be about to faint include:  Feeling dizzy or light-headed.  Feeling nauseous.  Seeing all white or all black in your field of vision.  Having cold, clammy skin. If you faint, get medical help right away. Call your local emergency services (911 in the [place] Do not drive yourself to the hospital. Follow these instructions at home: Pay attention to any changes in your symptoms. Take these actions to stay safe and to help relieve your symptoms: Lifestyle  Do not drive, use machinery, or play sports until your health care provider says it is okay.  Do not drink alcohol.  Do not use any products that contain nicotine or tobacco, such as cigarettes and e-cigarettes. If you need help quitting, ask your health care provider.  Drink enough fluid to keep your urine pale yellow. General instructions  Take over-the-counter and prescription medicines only as told by your health care provider.  If you are taking blood pressure or heart medicine, get up slowly and take several minutes to sit and then stand. This can reduce dizziness or light-headedness.  Have someone stay with you until you feel stable.  If you start to feel like you might faint, lie down right away and raise (elevate) your feet above the level of your heart. Breathe deeply and steadily. Wait until all the symptoms have passed.  Keep all follow-up visits as told by your health care provider. This is important. Get help right away if you:  Have a severe headache.  Faint once or repeatedly.  Have pain in your chest,  abdomen, or back.  Have a very fast or irregular heartbeat (palpitations).  Have pain when you breathe.  Are bleeding from your mouth or rectum, or you have black or tarry stool.  Have a seizure.  Are confused.  Have trouble walking.  Have severe weakness.  Have vision problems. These symptoms may represent a serious problem that is an emergency. Do not wait to see if your symptoms will go away. Get medical help right away. Call your local emergency services (911 in the [place] Do not drive yourself to the hospital. Summary  Syncope refers to a condition in which a person temporarily loses consciousness. It is caused by a sudden decrease in blood flow to the brain.  Signs that you may be about to faint include dizziness, feeling light-headed, feeling nauseous, sudden vision changes, or cold, clammy skin.  Although most causes of syncope are not dangerous, syncope can be a sign of a serious medical problem. If you faint, get medical help right away. This information is not intended to replace advice given to you by your health care provider. Make sure you discuss any questions you have with your health care provider. Document Revised: 08/19/2017 Document Reviewed: 08/15/2017 Elsevier Patient Education  2020 Elsevier Inc.  

## 2020-02-12 NOTE — Telephone Encounter (Signed)
Letter done

## 2020-02-12 NOTE — Telephone Encounter (Signed)
Please advise 

## 2020-02-12 NOTE — Telephone Encounter (Signed)
Patient called back to let Dr. Caryl Never know that it was 7 days that he needed to know, and that her first day was the 23rd.  She stated he would know what she was talking about.  She said she would come pick up the letter once it is ready.

## 2020-02-12 NOTE — Progress Notes (Signed)
Subjective:     Patient ID: Kelli Stout, female   DOB: 12-18-1965, 54 y.o.   MRN: [id number]  HPI   Jeannene Patella is seen following syncopal episode which occurred at work this past Saturday.  She had taken on a job recently with [employer] and was doing some baggage loading and this occurred around 7 PM Saturday.  She been working out heat all day and states things were fairly hot.  She does feel she been drinking adequate fluids.  She recalls feeling lightheaded ahead of time.  She is not sure how long she was out.  There was no suspicion of seizure activity.  No confusion afterwards.  No chest pains.  No palpitations.  She went into the shade and got some fluids and started recovering and then drove home but was not evaluated.  No prior history of syncope.  She does have history of hypertension and takes low-dose Maxide.  She is also on Vesicare for urine urgency but has not had orthostatic type symptoms with that.  He has had no syncope or significant dizziness since the episode Saturday  Past Medical History:  Diagnosis Date  . Anxiety   . Chicken pox   . Depression   . HIV infection ([place])   . Hypertension   . UTI (urinary tract infection)    Past Surgical History:  Procedure Laterality Date  . ABDOMINAL HYSTERECTOMY  1989   partial  . TOE SURGERY  09/01/2018   Left foot    reports that she has never smoked. She has never used smokeless tobacco. She reports that she does not drink alcohol or use drugs. family history includes Arthritis in her maternal grandmother; Cancer in her maternal grandmother; Cancer (age of onset: 24) in her son; Colon cancer (age of onset: 73) in her maternal aunt; Diabetes in her father; Heart disease in her maternal uncle and mother; Hyperlipidemia in her mother; Hypertension in her father and mother; Mental illness in her mother. No Known Allergies   Review of Systems  Constitutional: Negative for chills, fatigue and fever.  Eyes: Negative for visual  disturbance.  Respiratory: Negative for cough, chest tightness, shortness of breath and wheezing.   Cardiovascular: Negative for chest pain, palpitations and leg swelling.  Gastrointestinal: Negative for abdominal pain.  Genitourinary: Negative for dysuria.  Neurological: Positive for syncope. Negative for dizziness, seizures, weakness, light-headedness and headaches.       See HPI  Psychiatric/Behavioral: Negative for confusion.       Objective:   Physical Exam Constitutional:      Appearance: She is well-developed.  Eyes:     Pupils: Pupils are equal, round, and reactive to light.  Neck:     Thyroid: No thyromegaly.     Vascular: No JVD.  Cardiovascular:     Rate and Rhythm: Normal rate and regular rhythm.     Heart sounds: No gallop.   Pulmonary:     Effort: Pulmonary effort is normal. No respiratory distress.     Breath sounds: Normal breath sounds. No wheezing or rales.  Musculoskeletal:     Cervical back: Neck supple.     Right lower leg: No edema.     Left lower leg: No edema.  Neurological:     General: No focal deficit present.     Mental Status: She is alert and oriented to person, place, and time.     Cranial Nerves: No cranial nerve deficit.     Motor: No weakness.  Assessment:     Brief syncopal episode this past Saturday at work after working in heat for several hours.  Etiology unclear.  Likely related to some orthostasis.  Her blood pressure is stable at this time.  Normal heart exam.  Suspect she is not acclimated yet to heat    Plan:     -Obtain EKG-this shows sinus rhythm with no acute changes -Check labs including CBC, basic metabolic panel, magnesium level -Stay well-hydrated -Follow-up immediately for any recurrent dizziness or syncope.  Would consider echo and event monitor for any recurrent episodes  Kristian Covey MD [facility] at [facility]

## 2020-02-13 NOTE — Telephone Encounter (Signed)
Pt has been notified that letter is ready for pick up and is up front

## 2020-03-09 ENCOUNTER — Other Ambulatory Visit: Payer: Self-pay | Admitting: Family Medicine

## 2020-03-09 DIAGNOSIS — I10 Essential (primary) hypertension: Secondary | ICD-10-CM

## 2020-03-11 ENCOUNTER — Telehealth: Payer: Self-pay | Admitting: Family Medicine

## 2020-03-11 NOTE — Telephone Encounter (Signed)
Pt needs a refill on Triamterene 37.5 mg/HCTZ 25 mg tablets.  Pharmacy- [facility] on [address]

## 2020-03-11 NOTE — Telephone Encounter (Signed)
Called and told pt that this prescription was sent in yesterday

## 2020-06-03 ENCOUNTER — Other Ambulatory Visit: Payer: Self-pay | Admitting: Family Medicine

## 2020-06-03 DIAGNOSIS — I10 Essential (primary) hypertension: Secondary | ICD-10-CM

## 2020-07-25 ENCOUNTER — Telehealth (INDEPENDENT_AMBULATORY_CARE_PROVIDER_SITE_OTHER): Payer: BLUE CROSS/BLUE SHIELD | Admitting: Family Medicine

## 2020-07-25 ENCOUNTER — Encounter: Payer: Self-pay | Admitting: Family Medicine

## 2020-07-25 VITALS — Ht 68.0 in | Wt 250.0 lb

## 2020-07-25 DIAGNOSIS — F439 Reaction to severe stress, unspecified: Secondary | ICD-10-CM

## 2020-07-25 MED ORDER — LORAZEPAM 0.5 MG PO TABS
0.5000 mg | ORAL_TABLET | Freq: Four times a day (QID) | ORAL | 0 refills | Status: AC | PRN
Start: 2020-07-25 — End: ?
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
## 2020-07-28 ENCOUNTER — Telehealth: Payer: Self-pay | Admitting: Family Medicine

## 2020-07-28 NOTE — Telephone Encounter (Addendum)
Pt requesting to PICK UP LETTER TOMORROW 07/29/20. Requesting letter for being out of work from 07/24/20 to 07/29/20.  Return to work on Wednesday 07/30/20.  Also pt brought accomodation forms for provider to fill out for pt for work. Call pt when ready for pick up [phone]. Placed in provider folder.

## 2020-07-28 NOTE — Telephone Encounter (Signed)
Please advise 

## 2020-07-29 NOTE — Telephone Encounter (Signed)
Letter done.  I have printed it out.

## 2020-07-29 NOTE — Telephone Encounter (Signed)
Okay; thanks.

## 2020-07-29 NOTE — Telephone Encounter (Signed)
Called and spoke with patient informed pt that her letter was up front for her to put up. Pt stated that she would come by the office today to put the letter.

## 2020-07-29 NOTE — Telephone Encounter (Signed)
Pt picked up letter at front desk. Pt states she will be back Monday 08/04/20 to pick up forms she left yesterday for provider to fill out. Pt will be in [place] until Monday 08/04/20.

## 2020-09-21 ENCOUNTER — Other Ambulatory Visit: Payer: Self-pay | Admitting: Family Medicine

## 2020-09-21 DIAGNOSIS — I10 Essential (primary) hypertension: Secondary | ICD-10-CM

## 2020-09-22 NOTE — Telephone Encounter (Signed)
Patient is calling to check the status of medication refill for Triamterene-HCTZ 90 day supply to be sent to [id number] - [place], [place] - [address] AT [facility] [address]  Phone:  [phone] Fax:  [phone]  CB is [phone]

## 2020-12-02 ENCOUNTER — Ambulatory Visit: Payer: Self-pay

## 2020-12-02 ENCOUNTER — Other Ambulatory Visit: Payer: Self-pay

## 2020-12-02 ENCOUNTER — Ambulatory Visit: Payer: BC Managed Care – PPO | Admitting: Orthopaedic Surgery

## 2020-12-02 ENCOUNTER — Encounter: Payer: Self-pay | Admitting: Physician Assistant

## 2020-12-02 DIAGNOSIS — M25561 Pain in right knee: Secondary | ICD-10-CM

## 2020-12-02 DIAGNOSIS — M25511 Pain in right shoulder: Secondary | ICD-10-CM

## 2020-12-02 DIAGNOSIS — M25512 Pain in left shoulder: Secondary | ICD-10-CM | POA: Diagnosis not present

## 2020-12-02 DIAGNOSIS — G8929 Other chronic pain: Secondary | ICD-10-CM

## 2020-12-02 MED ORDER — LIDOCAINE HCL 2 % IJ SOLN
2.0000 mL | INTRAMUSCULAR | Status: AC | PRN
  Administered 2020-12-02: 2 mL

## 2020-12-02 MED ORDER — METHYLPREDNISOLONE ACETATE 40 MG/ML IJ SUSP
40.0000 mg | INTRAMUSCULAR | Status: AC | PRN
  Administered 2020-12-02: 40 mg via INTRA_ARTICULAR

## 2020-12-02 MED ORDER — BUPIVACAINE HCL 0.25 % IJ SOLN
2.0000 mL | INTRAMUSCULAR | Status: AC | PRN
  Administered 2020-12-02: 2 mL via INTRA_ARTICULAR

## 2020-12-02 MED ORDER — LIDOCAINE HCL 1 % IJ SOLN
2.0000 mL | INTRAMUSCULAR | Status: AC | PRN
  Administered 2020-12-02: 2 mL

## 2020-12-02 NOTE — Progress Notes (Signed)
Office Visit Note   Patient: Kelli Stout           Date of Birth: 06/28/1966           MRN: [id number] Visit Date: 12/02/2020              Requested by: Kristian Covey, MD [address] [place],  [place] [address] PCP: Kristian Covey, MD   Assessment & Plan: Visit Diagnoses:  1. Chronic pain of right knee   2. Chronic pain of both shoulders     Plan: Impression is right knee arthritis flareup and bilateral shoulder subacromial bursitis.  We have discussed cortisone injection to the right knee and left shoulder subacromial space today for which she would like to proceed.  She will follow up with [place] for right shoulder subacromial cortisone injection.  We have also discussed possible AC joint injection to the left shoulder should the subacromial injection not alleviate all of her symptoms.  Call with concerns or questions in meantime.  Follow-Up Instructions: Return if symptoms worsen or fail to improve.   Orders:  Orders Placed This Encounter  Procedures  . Large Joint Inj: R knee  . Large Joint Inj: L subacromial bursa  . XR KNEE 3 VIEW RIGHT  . XR Shoulder Right  . XR Shoulder Left   No orders of the defined types were placed in this encounter.     Procedures: Large Joint Inj: R knee on 12/02/2020 9:28 AM Indications: pain Details: 22 G needle, anterolateral approach Medications: 2 mL lidocaine 1 %; 2 mL bupivacaine 0.25 %; 40 mg methylPREDNISolone acetate 40 MG/ML  Large Joint Inj: L subacromial bursa on 12/02/2020 9:28 AM Indications: pain Details: 22 G needle Medications: 2 mL lidocaine 2 %; 2 mL bupivacaine 0.25 %; 40 mg methylPREDNISolone acetate 40 MG/ML Outcome: tolerated well, no immediate complications Patient was prepped and draped in the usual sterile fashion.       Clinical Data: No additional findings.   Subjective: Chief Complaint  Patient presents with  . 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  In regards to the right knee, she has had pain here for the past 3 weeks.  No new injury or change in activity.  Her pain is to the entire knee and is worse going from a seated to standing position as well as getting in and out of the car and flexing the knee.  She has been using compression socks and taking aspirin with mild relief of symptoms.  She does note occasional burning sensations to the popliteal fossa.  No previous knee injection.  In regards to the shoulders, she has noticed pain to the left shoulder for the past few months and pain to the right shoulder for the past 3 weeks.  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  Her pain is worse with forward flexion.  No paresthesias.  No previous injections.  Review of Systems as detailed in HPI.  All others reviewed and are negative.   Objective: Vital Signs: There were no vitals taken for this visit.  Physical Exam well-developed well-nourished female no acute distress.  Alert oriented x3.  Ortho Exam right knee exam shows a small effusion.  Range of motion 0 to 125 degrees.  Medial joint line tenderness.  Moderate patellofemoral  crepitus.  Ligaments are stable.  She is neurovascular intact distally.  Bilateral shoulder exam shows full active range of motion all planes.  She can internally rotate to T12 both sides.  Marked positive to cane on the left.  Negative empty can on the right.  Moderate tenderness to both AC joints left greater than right.  Positive cross body adduction both sides.  No focal weakness.  She is neurovascular intact distally.  Specialty Comments:  No specialty comments available.  Imaging: XR KNEE 3 VIEW RIGHT  Result Date: 12/02/2020 Mild to moderate medial and patellofemoral joint space narrowing  XR Shoulder Left  Result Date: 12/02/2020 Moderate AC joint and mild glenohumeral  space narrowing  XR Shoulder Right  Result Date: 12/02/2020 Moderate AC joint and mild glenohumeral space narrowing    PMFS History: Patient Active Problem List   Diagnosis Date Noted  . Urinary urgency 05/11/2016  . BPPV (benign paroxysmal positional vertigo) 06/08/2015  . Lumbar radiculopathy 07/13/2013  . HIV 11/17/2006  . OBESITY, NOS 11/17/2006  . Recurrent depression (HCC) 11/17/2006  . HYPERTENSION, BENIGN SYSTEMIC 11/17/2006   Past Medical History:  Diagnosis Date  . Anxiety   . Chicken pox   . Depression   . HIV infection (HCC)   . Hypertension   . UTI (urinary tract infection)     Family History  Problem Relation Age of Onset  . Hyperlipidemia Mother   . Heart disease Mother   . Hypertension Mother   . Mental illness Mother   . Hypertension Father   . Diabetes Father        type ll  . Colon cancer Maternal Aunt 70  . Heart disease Maternal Uncle   . Arthritis Maternal Grandmother   . Cancer Maternal Grandmother        bladder  . Cancer Son 67       osteosarcoma    Past Surgical History:  Procedure Laterality Date  . ABDOMINAL HYSTERECTOMY  1989   partial  . TOE SURGERY  09/01/2018   Left foot   Social History   Occupational History  . Not on file  Tobacco Use  . Smoking status: Never Smoker  . Smokeless tobacco: Never Used  Vaping Use  . Vaping Use: Never used  Substance and Sexual Activity  . Alcohol use: No    Alcohol/week: 0.0 standard drinks  . Drug use: No  . Sexual activity: Not on file

## 2020-12-16 ENCOUNTER — Other Ambulatory Visit: Payer: Self-pay

## 2020-12-16 ENCOUNTER — Ambulatory Visit (INDEPENDENT_AMBULATORY_CARE_PROVIDER_SITE_OTHER): Payer: BC Managed Care – PPO | Admitting: Orthopaedic Surgery

## 2020-12-16 DIAGNOSIS — G8929 Other chronic pain: Secondary | ICD-10-CM

## 2020-12-16 DIAGNOSIS — M25511 Pain in right shoulder: Secondary | ICD-10-CM

## 2020-12-16 DIAGNOSIS — M25512 Pain in left shoulder: Secondary | ICD-10-CM | POA: Diagnosis not present

## 2020-12-16 DIAGNOSIS — M25561 Pain in right knee: Secondary | ICD-10-CM | POA: Diagnosis not present

## 2020-12-16 MED ORDER — MELOXICAM 7.5 MG PO TABS: 7.5000 mg | ORAL_TABLET | Freq: Two times a day (BID) | ORAL | 5 refills | Status: AC | PRN

## 2020-12-16 NOTE — Progress Notes (Signed)
Office Visit Note   Patient: Kelli Stout           Date of Birth: 1966-08-11           MRN: [id number] Visit Date: 12/16/2020              Requested by: Kristian Covey, MD [address] [place],  [place] [address] PCP: Kristian Covey, MD   Assessment & Plan: Visit Diagnoses:  1. Chronic pain of right knee   2. Chronic pain of both shoulders     Plan: Impression is bilateral shoulder pain and right knee pain.  Cortisone injections have given temporary relief.  We will order MRI of the right knee to rule out structural abnormalities.  For the shoulders I recommend a course of outpatient physical therapy and meloxicam.  Follow-up after the MRI.  Follow-Up Instructions: Return if symptoms worsen or fail to improve.   Orders:  Orders Placed This Encounter  Procedures  . Ambulatory referral to Physical Therapy   Meds ordered this encounter  Medications  . meloxicam (MOBIC) 7.5 MG tablet    Sig: Take 1 tablet (7.5 mg total) by mouth 2 (two) times daily as needed for pain.    Dispense:  30 tablet    Refill:  5      Procedures: No procedures performed   Clinical Data: No additional findings.   Subjective: Chief Complaint  Patient presents with  . Left Shoulder - Follow-up  . Right Knee - Follow-up    Kelli Stout returns today for follow-up of right knee pain and bilateral shoulder pain.  Left subacromial injection helped for about 2 days only.  The right knee has felt only some slight improvement from the injection.  She continues to have limited range of motion both shoulders and with pain that radiates to the back of the shoulder.  .   Review of Systems   Objective: Vital Signs: There were no vitals taken for this visit.  Physical Exam  Ortho Exam Right knee shows no joint effusion.  She has medial joint line tenderness and tenderness along the medial aspect of the knee  Bilateral shoulder show mild restriction range of motion.  Strength is  normal to manual muscle testing of the rotator cuff. Specialty Comments:  No specialty comments available.  Imaging: No results found.   PMFS History: Patient Active Problem List   Diagnosis Date Noted  . Urinary urgency 05/11/2016  . BPPV (benign paroxysmal positional vertigo) 06/08/2015  . Lumbar radiculopathy 07/13/2013  . HIV 11/17/2006  . OBESITY, NOS 11/17/2006  . Recurrent depression (HCC) 11/17/2006  . HYPERTENSION, BENIGN SYSTEMIC 11/17/2006   Past Medical History:  Diagnosis Date  . Anxiety   . Chicken pox   . Depression   . HIV infection (HCC)   . Hypertension   . UTI (urinary tract infection)     Family History  Problem Relation Age of Onset  . Hyperlipidemia Mother   . Heart disease Mother   . Hypertension Mother   . Mental illness Mother   . Hypertension Father   . Diabetes Father        type ll  . Colon cancer Maternal Aunt 70  . Heart disease Maternal Uncle   . Arthritis Maternal Grandmother   . Cancer Maternal Grandmother        bladder  . Cancer Son 14       osteosarcoma    Past Surgical History:  Procedure Laterality Date  .  ABDOMINAL HYSTERECTOMY  1989   partial  . TOE SURGERY  09/01/2018   Left foot   Social History   Occupational History  . Not on file  Tobacco Use  . Smoking status: Never Smoker  . Smokeless tobacco: Never Used  Vaping Use  . Vaping Use: Never used  Substance and Sexual Activity  . Alcohol use: No    Alcohol/week: 0.0 standard drinks  . Drug use: No  . Sexual activity: Not on file

## 2020-12-30 ENCOUNTER — Ambulatory Visit
Admission: RE | Admit: 2020-12-30 | Discharge: 2020-12-30 | Disposition: A | Discharge status: Home / Self Care | Payer: BC Managed Care – PPO | Source: Ambulatory Visit | Attending: Orthopaedic Surgery | Admitting: Orthopaedic Surgery

## 2020-12-30 ENCOUNTER — Other Ambulatory Visit: Payer: Self-pay

## 2020-12-30 DIAGNOSIS — G8929 Other chronic pain: Secondary | ICD-10-CM

## 2020-12-30 DIAGNOSIS — M25561 Pain in right knee: Secondary | ICD-10-CM

## 2021-01-06 ENCOUNTER — Telehealth: Payer: Self-pay

## 2021-01-06 NOTE — Progress Notes (Signed)
Needs appt to review MRI.  Thanks.

## 2021-01-06 NOTE — Progress Notes (Signed)
Lvm for pt to call back to schedule °

## 2021-01-06 NOTE — Telephone Encounter (Signed)
lvm for pt to cb to schedule MRI f/u

## 2021-01-07 ENCOUNTER — Ambulatory Visit (INDEPENDENT_AMBULATORY_CARE_PROVIDER_SITE_OTHER): Payer: BC Managed Care – PPO | Admitting: Orthopaedic Surgery

## 2021-01-07 ENCOUNTER — Other Ambulatory Visit: Payer: Self-pay

## 2021-01-07 DIAGNOSIS — S83241A Other tear of medial meniscus, current injury, right knee, initial encounter: Secondary | ICD-10-CM | POA: Diagnosis not present

## 2021-01-07 NOTE — Progress Notes (Signed)
Office Visit Note   Patient: Kelli Stout           Date of Birth: 09-02-1966           MRN: [id number] Visit Date: 01/07/2021              Requested by: Kristian Covey, MD [address] [place],  [place] [address] PCP: Kristian Covey, MD   Assessment & Plan: Visit Diagnoses:  1. Acute medial meniscus tear of right knee, initial encounter     Plan: MRI shows a horizontal tear of the posterior horn and body of the medial meniscus.  She has mild chondromalacia of the knee.  These findings were reviewed with the patient in detail.  She will take time to think about her options based on how symptomatic and painful the meniscus tear is.  She has my card for when she is ready for arthroscopic surgery.  Follow-Up Instructions: Return if symptoms worsen or fail to improve.   Orders:  No orders of the defined types were placed in this encounter.  No orders of the defined types were placed in this encounter.     Procedures: No procedures performed   Clinical Data: No additional findings.   Subjective: Chief Complaint  Patient presents with  . Right Knee - Follow-up    MRI right knee review     Kelli Stout is a 55 year old female returning today for review of recent MRI of the right knee.  Denies any changes.  She no longer has mechanical symptoms but just medial sided knee pain with activity and standing.   Review of Systems  Constitutional: Negative.   HENT: Negative.   Eyes: Negative.   Respiratory: Negative.   Cardiovascular: Negative.   Endocrine: Negative.   Musculoskeletal: Negative.   Neurological: Negative.   Hematological: Negative.   Psychiatric/Behavioral: Negative.   All other systems reviewed and are negative.    Objective: Vital Signs: There were no vitals taken for this visit.  Physical Exam Vitals and nursing note reviewed.  Constitutional:      Appearance: She is well-developed.  Pulmonary:     Effort: Pulmonary effort is  normal.  Skin:    General: Skin is warm.     Capillary Refill: Capillary refill takes less than 2 seconds.  Neurological:     Mental Status: She is alert and oriented to person, place, and time.  Psychiatric:        Behavior: Behavior normal.        Thought Content: Thought content normal.        Judgment: Judgment normal.     Ortho Exam Right knee shows medial joint line tenderness.  Slight tenderness to the pes anserine bursa.  Trace effusion. Specialty Comments:  No specialty comments available.  Imaging: No results found.   PMFS History: Patient Active Problem List   Diagnosis Date Noted  . Acute medial meniscus tear of right knee 01/07/2021  . Urinary urgency 05/11/2016  . BPPV (benign paroxysmal positional vertigo) 06/08/2015  . Lumbar radiculopathy 07/13/2013  . HIV 11/17/2006  . OBESITY, NOS 11/17/2006  . Recurrent depression (HCC) 11/17/2006  . HYPERTENSION, BENIGN SYSTEMIC 11/17/2006   Past Medical History:  Diagnosis Date  . Anxiety   . Chicken pox   . Depression   . HIV infection (HCC)   . Hypertension   . UTI (urinary tract infection)     Family History  Problem Relation Age of Onset  . Hyperlipidemia Mother   .  Heart disease Mother   . Hypertension Mother   . Mental illness Mother   . Hypertension Father   . Diabetes Father        type ll  . Colon cancer Maternal Aunt 70  . Heart disease Maternal Uncle   . Arthritis Maternal Grandmother   . Cancer Maternal Grandmother        bladder  . Cancer Son 85       osteosarcoma    Past Surgical History:  Procedure Laterality Date  . ABDOMINAL HYSTERECTOMY  1989   partial  . TOE SURGERY  09/01/2018   Left foot   Social History   Occupational History  . Not on file  Tobacco Use  . Smoking status: Never Smoker  . Smokeless tobacco: Never Used  Vaping Use  . Vaping Use: Never used  Substance and Sexual Activity  . Alcohol use: No    Alcohol/week: 0.0 standard drinks  . Drug use: No  .  Sexual activity: Not on file

## 2021-02-03 DIAGNOSIS — B20 Human immunodeficiency virus [HIV] disease: Principal | ICD-10-CM

## 2021-02-11 ENCOUNTER — Ambulatory Visit: Admit: 2021-02-11 | Discharge: 2021-02-11 | Payer: PRIVATE HEALTH INSURANCE | Attending: Family | Primary: Family

## 2021-02-11 ENCOUNTER — Ambulatory Visit: Admit: 2021-02-11 | Discharge: 2021-02-11 | Payer: PRIVATE HEALTH INSURANCE | Attending: Clinical | Primary: Clinical

## 2021-02-11 ENCOUNTER — Ambulatory Visit: Admit: 2021-02-11 | Discharge: 2021-02-11 | Payer: PRIVATE HEALTH INSURANCE

## 2021-02-11 DIAGNOSIS — B20 Human immunodeficiency virus [HIV] disease: Principal | ICD-10-CM

## 2021-02-11 DIAGNOSIS — Z0000 Encounter for general adult medical examination without abnormal findings: Principal | ICD-10-CM

## 2021-02-11 NOTE — Unmapped (Signed)
INFECT[facility] DISEASES CLINIC  [address]  [place], [place]  [address]  P [phone]  F [phone]     Primary care provider: Mariana Single, FNP    Assessment/Plan:      HIV (dx'd 2004)  - chronic, stable  Health Department came to her home and told she needed testing.  Participated in study that started her on Edurant(RPV) and Truvada(FTC/TDF)-equivalent to Complera  Has been stable on current regimen.  Open to starting one-pill regimen.    Overall doing well. Current regimen: rilpivirine and Truvada (FTC/TDF)- has missed about 3 months of doses due to insurance and pharmacy issues and cost  Misses doses of ARVs never if she has access to meds    Med access through insurance  CD4 count to be determined  Discussed ARV adherence and taking ARVs with food    Lab Results   Component Value Date    ACD4 702 02/11/2021    CD4 54 02/11/2021    HIVRS Detected (A) 02/11/2021    HIVCP 65,626 (H) 02/11/2021       ??? CD4, HIV RNA, and safety labs (full return panel) and genotyping (PR, RT, and IN)  ??? Initiating Odefsey (FTC/RPV/TAF) today. Patient's insurance does not cover for Complera. Odefsy is equivalent, but has TAF rather than TDF. Patient amenable to starting this.  ??? Discussed importance of ARV adherence  ?? She is hoping to switch jobs soon and her insurance will change at that time      Depression  - acute, exacerbated  Started therapy  Patient has had two traumatic losses in her life (two family members killed within the same week) and has had financial difficulties that has been quite stressful  ??? Patient will meet with social work today to see how our team can best support her.  ??? Became quite depressed in August 2021 at the time of the loss of her family members and not being able to access her medications. She is stable at this time but still has low mood.      History of CoVid infection (02/2019) - acute, self-limited  Symptoms included: Fever, body aches. Denies loss of taste or smell, coughing, GI complaints  Symptoms resolved.  ?? Provide CoVid booster today (Moderna)      Hypertension  - chronic  133/92 today  ??? Per med list, patient on Amlodipine 2.5mg  daily, Maxzide-25 (0.5 tablet daily)      Nicotine dependence  - Never smoker      Sexual health & secondary prevention  - abstinent at this time  Not in relationship.  LSE 05/2020  Parts of body used during sex include: mouth and vagina. Does not have anal sex. Gives and receives oral sex. Receptive partner for vaginal sex.   In past 6 months has had no sex and has not had add'l STI screening.  She uses condoms  She does routinely discuss HIV status with partner(s).  Have not discussed interest in having children.    No results found for: RPR, CTNAA, GCNAA, SPECSOURCE    ??? GC/CT NAATs - patient declined screening  ??? RPR - 07/29/20 nonreactive (CareEverywhere)      Health maintenance  - chronic, stable  PCP: referred to Internal Medicine    Oral health  She does  have a dentist. Last dental exam 09/2020.    Eye health  She does  use corrective lenses. Last eye exam 08/2020. Early glaucoma    Metabolic conditions  Wt  Readings from Last 5 Encounters:   02/11/21 (!) 111.8 kg (246 lb 6.4 oz)     Lab Results   Component Value Date    CREATININE 0.86 (H) 02/11/2021    GLU 104 02/11/2021    ALT 23 02/11/2021     # Kidney health - creatinine today  # Bone health - FRAX score major osteoporotic 2.1%/Hip fracture 0.1%% on this date: 02/13/21  # Diabetes assessment - random glucose today  # NAFLD assessment - monitor over time    Communicable diseases  No results found for: QFTTBGOLD, QTBG, HEPAIGG, HEPBSAG, HEPBSAB, HEPCAB, HCVRNA, HCVRNAIU, HCVR, HCVIU, RUBEOL, MUMPSIGG, RUBIG, VZVIGG  # TB screening - no longer needed; negative IGRA, low risk 01/04/2019  # Hepatitis screening - as noted: (07/21/20) HepB sAg NR, HepC Ab NR, needs full Hep B labs  # MMR screening - 01/04/19 Measles immune    Cancer screening  No results found for: PSASCRN, PSA, PAP, FINALDX  # Cervical - Managed by GYN  # Breast - Managed by GYN    # Anorectal - Needs anal pap  # Colorectal - Defer to PCP  # Liver - no screening indicated  # Lung - screening not indicated    Cardiovascular disease  No results found for: CHOL, HDL, LDL, NONHDL, TRIG  # The 10-year ASCVD risk score Denman George DC Jr., et al., 2013) is: 2.5%  - is not taking aspirin   - is not taking statin  - BP control fair  - never smoker  # AAA screening - no indication for screening    Immunization History   Administered Date(s) Administered   ??? COVID-19 VACCINE, MRNA(MODERNA)(BOOSTER)(PF) 02/11/2021   ??? COVID-19 VACCINE,MRNA(MODERNA)(PF)(IM) 02/13/2020, 03/12/2020   ??? DTaP, Unspecified Formulation 03/09/1966, 04/08/1966, 05/10/1966, 05/17/1968   ??? Hepatitis B vaccine, pediatric/adolescent dosage, 05/17/2003, 06/18/2003, 11/22/2003   ??? INFLUENZA TIV (TRI) 666MO+ W/ PRESERV (IM) 09/27/2011, 06/07/2012, 07/20/2012, 06/08/2013, 06/03/2015, 08/06/2019   ??? Influenza Vaccine Quad (IIV4 W/PRESERV) 666MO+ 09/27/2011, 06/07/2012, 06/08/2013, 06/03/2015, 08/06/2019   ??? Influenza Virus Vaccine, unspecified formulation 09/27/2011, 06/07/2012, 07/20/2012, 06/08/2013, 06/03/2015   ??? Measles 04/12/1969     ??? Immunizations today - COVID booster      Counseling services took more than 50% of today's visit.  I personally spent 45 minutes face-to-face and non-face-to-face in the care of this patient, which includes all pre, intra, and post visit time on the date of service.      Disposition  Next appointment: 4 weeks      To do @ next RTC  ??? Hep B sAb, Hep B cAb  ??? Anal pap  ??? Prevnar 13  ??? UA      Varney Daily, FNP-BC  [facility]  [address] [place], [place] [address]    Phone: [phone]   Fax: [phone]           Subjective      Chief Complaint   Establish HIV care, known HIV diagnosis    HPI  In addition to details in A&P above:  ?? Has had 3 month interruption of ART. Has been paying $120 a month for meds. Finances have been difficult.  ?? She would like to change jobs. Has had an interview today.  ?? Tongue hurts on left side with certain movement x 3 days. On exam, no lesions, no decreased movement.  ?? Has had recent discontent with her care at [facility]. She would like to transfer all her care  to [facility]. She is willing to drive to [facility] from [place] where she lives. She does like her GYN quite a bit and will continue follow up with GYN.  Denies any fever, chills, nausea, vomiting, rash, urinary complaints, diarrhea, constipation.        Past Medical History:   Diagnosis Date   ??? Other specified glaucoma    ??? Urinary tract infection        Social History  Housing - in apartment with roommate  School / Work & Benefits - employed ([employer]), hoping to change jobs.    Social History     Tobacco Use   ??? Smoking status: Never Smoker   ??? Smokeless tobacco: Never Used   Substance Use Topics   ??? Drug use: Never       Review of Systems  As per HPI. All others negative.      Medications and Allergies  She has a current medication list which includes the following prescription(s): amlodipine, meloxicam, oxybutynin, triamterene-hydrochlorothiazide, valacyclovir, venlafaxine, odefsey, and triamterene-hydrochlorothiazide.    Allergies: Patient has no known allergies.      Family History  Her family history includes Asthma in her maternal aunt and maternal grandmother; COPD in her mother; Cancer in her maternal aunt, maternal grandmother, and son; Depression in her mother; Diabetes in her brother and father; Heart disease in her maternal aunt and mother; Hypertension in her brother, father, and mother; Kidney disease in her paternal aunt and paternal uncle.           Objective      BP 133/92 (BP Site: L Arm)  - Pulse 83  - Temp 37.2 ??C (99 ??F) (Oral)  - Ht 172.7 cm (5' 8)  - Wt (!) 111.8 kg (246 lb 6.4 oz)  - BMI 37.46 kg/m??      Const looks well and attentive, alert, appropriate   Eyes sclerae anicteric, noninjected OU   ENT no thrush, leukoplakia or oral lesions and no lesions to tongue, no deficits in movement. Very mild discomfort when pushing tongue to right side.   Lymph bilateral anterior cervical posterior cervical supraclavicular NO LAD   CV RRR. No murmurs. No rub or gallop. S1/S2.   Resp CTAB ant/post, normal work of breathing   GI Soft. NTND. NABS.   GU deferred   Rectal deferred   Skin no petechiae, ecchymoses or obvious rashes on clothed exam   MSK no joint tenderness and normal ROM throughout   Neuro grossly intact   Psych Appropriate affect. Eye contact good. Linear thoughts. Fluent speech.

## 2021-02-12 DIAGNOSIS — B20 Human immunodeficiency virus [HIV] disease: Principal | ICD-10-CM

## 2021-02-12 MED ORDER — ODEFSEY 200 MG-25 MG-25 MG TABLET
ORAL_TABLET | Freq: Every day | ORAL | 5 refills | 30 days | Status: CP
Start: 2021-02-12 — End: ?
  Filled 2021-02-12: qty 30, 30d supply, fill #0

## 2021-02-12 NOTE — Unmapped (Signed)
[facility] Specialty Medication Onboarding    Specialty Medication: Doctor, general practice  Prior Authorization: Not Required   Financial Assistance: Yes - copay card approved as secondary   Final Copay/Day Supply: $0 / 30    Insurance Restrictions: None     Notes to Pharmacist: None    The triage team has completed the benefits investigation and has determined that the patient is able to fill this medication at [facility]. Please contact the patient to complete the onboarding or follow up with the prescribing physician as needed.

## 2021-02-12 NOTE — Unmapped (Signed)
[facility]   Patient Onboarding/Medication Counseling    Karen Strickland is a 55 y.o. female with HIV who I am counseling today on initiation of therapy.  I am speaking to the patient.    Was a Nurse, learning disability used for this call? No    Verified patient's date of birth / HIPAA.    Specialty medication(s) to be sent: Infectious Disease: Odefsey      Non-specialty medications/supplies to be sent: n/a      Medications not needed at this time: n/a         Odefsey (emtricitabine, rilpirvirine, and tenofovir alafenamide)    Medication & Administration     Dosage: Take 1 tablet by mouth once daily    Administration: Take with a meal. Do not use a protein drink in place of a meal.    Adherence/Missed dose instructions: take missed dose as soon as you remember. If it is close to the time of your next dose, skip the dose and resume with your next scheduled dose.    Goals of Therapy     Suppress viral replication to keep HIV at a non-detectable level.    Side Effects & Monitoring Parameters   Common Side Effects:   ?? Trouble sleeping.   ?? Dizziness or headache.   ?? Feeling sleepy  ?? Upset stomach or throwing up  ?? Stomach pain.      The following side effects should be reported to the provider:  ?? Signs of an allergic reaction, like rash; hives; itching; red, swollen, blistered, or peeling skin with or without fever; wheezing; tightness in the chest or throat; trouble breathing, swallowing, or talking; unusual hoarseness; or swelling of the mouth, face, lips, tongue, or throat.   ?? Signs of kidney problems like unable to pass urine, change in how much urine is passed, blood in the urine, or a big weight gain.   ?? Signs of too much lactic acid in the blood (lactic acidosis) like fast breathing, fast heartbeat, a heartbeat that does not feel normal, very bad upset stomach or throwing up, feeling very sleepy, shortness of breath, feeling very tired or weak, very bad dizziness, feeling cold, or muscle pain or cramps. ?? Signs or symptoms of depression, suicidal thoughts, emotional ups and downs, abnormal thinking, anxiety, or lack of interest in life.   ?? Very bad skin and allergic reactions have happened with this drug. Skin reactions have happened along with fever or problems in body organs like the liver. Call your doctor right away if you have red, swollen, blistered, or peeling skin (with or without fever); red or irritated eyes; sores in your mouth, throat, nose, or eyes; trouble swallowing; or signs of liver problems like dark urine, feeling tired, not hungry, upset stomach or stomach pain, light-colored stools, throwing up, or yellow skin or eyes.    Monitoring Parameters:   ?? CD4 count  ?? HIV RNA plasma levels  ?? serum creatinine  ?? estimated creatinine clearance  ?? urine glucose  ?? urine protein (prior to initiation and as clinically indicated during therapy);   ?? hepatic function tests,   ?? bone density (patients with a history of bone fracture or have risk factors for bone loss);  ?? fever,   ?? skin reactions, and/or hypersensitivity reactions  ?? HBV testing (prior to or when initiating therapy).  ?? Patients with HIV and HBV coinfection should be monitored for several months following therapy discontinuation.    Contraindications, Warnings, & Precautions   ??  Depressive disorders  ?? Hepatotoxicity: Patients with hepatitis B or C or increased baseline liver function tests may be at greater risk  ?? QT prolongation    ?? Renal toxicity: Patients with preexisting renal impairment and those taking nephrotoxic agents (including nonsteroidal anti-inflammatory drugs) are at increased risk.     Drug/Food Interactions   ?? Medication list reviewed in Epic. The patient was instructed to inform the care team before taking any new medications or supplements. No drug interactions identified.   ?? Antacids: May decrease the serum concentration of Odefsey. Administer antacids at least 2 hours before or 4 hours after [facility]  ?? Histamine H2 Receptor Antagonists: May decrease the serum concentration of Odefsey.  Administer histamine H2 receptor antagonists at least 12 hours before or 4 hours after Odefsey.  ?? Proton Pump Inhibitors: May decrease the serum concentration of Odefsey.  This combination should be avoided.  If it cannot be avoided, another antiretroviral regimen should be used  ?? Avoid St John's wort  ?? Avoid NSAIDs if possible    Storage, Handling Precautions, & Disposal   ??? Store this medication at room temperature  ??? Keep lid tightly closed.  ??? Store in a dry place. Do not store in a bathroom.  ??? Keep all drugs in a safe place. Keep all drugs out of the reach of children and pets.  ??? Throw away unused or expired drugs. Do not flush down a toilet or pour down a drain unless you are told to do so. Check with your pharmacist if you have questions about the best way to throw out drugs. There may be drug take-back programs in your area.      Current Medications (including OTC/herbals), Comorbidities and Allergies     Current Outpatient Medications   Medication Sig Dispense Refill   ??? amLODIPine (NORVASC) 5 MG tablet Take 2.5 mg by mouth.     ??? emtricitab-rilpivir-tenofo ala (ODEFSEY) 200-25-25 mg Tab Take 1 tablet by mouth in the morning. WITH A MEAL. 30 tablet 5   ??? meloxicam (MOBIC) 7.5 MG tablet      ??? oxybutynin (DITROPAN-XL) 10 MG 24 hr tablet Continuous.     ??? triamterene-hydrochlorothiazide (DYAZIDE) 37.5-25 mg per capsule Continuous.     ??? triamterene-hydrochlorothiazide (MAXZIDE-25) 37.5-25 mg per tablet Take 0.5 tablets by mouth in the morning.     ??? valACYclovir (VALTREX) 500 MG tablet Continuous.     ??? venlafaxine (EFFEXOR-XR) 150 MG 24 hr capsule Continuous.       No current facility-administered medications for this visit.       No Known Allergies    There is no problem list on file for this patient.      Reviewed and up to date in Epic.    Appropriateness of Therapy     Acute infections noted within Epic:  No active infections  Patient reported infection: None    Is medication and dose appropriate based on diagnosis and infection status? Yes    Prescription has been clinically reviewed: Yes      Baseline Quality of Life Assessment      How many days over the past month did your HIV  keep you from your normal activities? For example, brushing your teeth or getting up in the morning. 0    Financial Information     Medication Assistance provided: Copay Assistance    Anticipated copay of $0.00 reviewed with patient. Verified delivery address.    Delivery Information  Scheduled delivery date: 02/13/21    Expected start date: 02/13/21    Medication will be delivered via UPS to the prescription address in [facility].  This shipment will not require a signature.      Explained the services we provide at [facility] and that each month we would call to set up refills.  Stressed importance of returning phone calls so that we could ensure they receive their medications in time each month.  Informed patient that we should be setting up refills 7-10 days prior to when they will run out of medication.  A pharmacist will reach out to perform a clinical assessment periodically.  Informed patient that a welcome packet, containing information about our pharmacy and other support services, a Notice of Privacy Practices, and a drug information handout will be sent.      The patient or caregiver noted above participated in the development of this care plan and knows that they can request review of or adjustments to the care plan at any time.      Patient or caregiver verbalized understanding of the above information as well as how to contact the pharmacy at [phone] option 4 with any questions/concerns.  The pharmacy is open Monday through Friday 8:30am-4:30pm.  A pharmacist is available 24/7 via pager to answer any clinical questions they may have.    Patient Specific Needs     - Does the patient have any physical, cognitive, or cultural barriers? No    - Patient prefers to have medications discussed with  Patient     - Is the patient or caregiver able to read and understand education materials at a high school level or above? Yes    - Patient's primary language is  English     - Is the patient high risk? No    - Does the patient require a Care Management Plan? No     - Does the patient require physician intervention or other additional services (i.e. nutrition, smoking cessation, social work)? No      Roderic Palau  [facility] Specialty Pharmacist

## 2021-02-13 LAB — HIV RNA, QUANTITATIVE, PCR
HIV RNA LOG(10): 4.82 {Log_copies}/mL — ABNORMAL HIGH (ref <0.00)
HIV RNA QNT RSLT: DETECTED — AB
HIV RNA: 65626 {copies}/mL — ABNORMAL HIGH (ref <0)

## 2021-02-13 NOTE — Unmapped (Signed)
COVID Education:  Make sure you perform good hand washing (lasting 20 seconds), continue to social distance and limit close personal contact (which may include new sexual partners or having multiple partners during this period).  Try to isolate at home but please find ways to keep in touch with those close to you, such as meeting up with them electronically or socially distanced, and the ability to go outdoors alone or separated from others  If you become ill with fever, respiratory illness, sudden loss of taste and smell, stomach issues, diarrhea, nausea, vomiting - contact clinic for further instructions.  You should go to the emergency department if you develop systems such as shortness of breath, confusion, lightheadedness when standing, high fever.   Here is some information about HIV and CoVid vaccines: [email or web address]  If you're interested in receiving the CoVid vaccine when you're eligible, here are some resources for you to check and make an appointment:  Your local health department   [email or web address] through [facility]  [email or web address]  [facility] (if you are an established patient with them)  [email or web address]    URGENT CARE  Please call ahead to speak with the nursing staff if you are in need of an urgent appointment.       MEDICATIONS  For refills please contact your pharmacy and ask them to electronically send or fax the request to the clinic.   Please bring all medications in original bottles to every appointment.    HMAP (formerly ADAP) or [employer] Eligibility (required even if you do not receive medication through [facility])  Please remember to renew your Juanell Fairly eligibility during renewal periods which occur twice a year: January-March and July-September.     The following are needed for each renewal:   - [facility] Identification (if you don't have one, then a bill with your name and address in [place])   - proof of income (award letter, W-2, or last three check stubs)   If you are unable to come in for renewal, let us know if we can mail, fax or e-mail paperwork to you.   HMAP Contact: [phone].     Lab info:  Your most recent CD4 T-cell counts and viral loads are below. Here are a few things to keep in mind when looking at your numbers:  Our goal is to get your virus to be undetectable and keep it undetectable. If the virus is undetectable you are much more likely to stay healthy.  We consider your viral load to be undetectable if it says <40 or if it says Not detected.  For most people, we're checking CD4 counts every other visit (once or twice a year, or sometimes even less).  It's normal for your CD4 count to be different from visit to visit.   You can help by taking your medications at about the same time, every single day. If you're having trouble with taking your medications, it's important to let us know.    Lab Results   Component Value Date    ACD4 702 02/11/2021    CD4 54 02/11/2021    HIVCP 65,626 (H) 02/11/2021    HIVRS Detected (A) 02/11/2021        Please note that your laboratory and other results may be visible to you in real time, possibly before they reach your provider. Please allow 48 hours for clinical interpretation of these results. Importantly, even if a result is flagged as abnormal, it may not be one that impacts  your health.    It was nice to have a visit with you today!  Follow-up information:  So great meeting you today!  Please take note of our contact information below should you need to reach the clinic.      Provider today:  Varney Daily, FNP-BC      ID CLINIC address:   [facility] at [facility]  [address]  [place], [place] [address]    Contact information:    The ID clinic phone number is [phone]   The ID clinic fax number is [phone]  For urgent issues on nights and weekends: Call the ID Physician on-call through the [facility] Operator at [phone].    Please sign up for My [facility] Chart - This is a great way to review your labs and track your appointments    Please try to arrive 30 minutes BEFORE your scheduled appointment time!  This will give you time to fill out any front desk paperwork needed for your visit, and allow you to be seen as close to your scheduled appointment time as possible.

## 2021-02-23 DIAGNOSIS — B20 Human immunodeficiency virus [HIV] disease: Principal | ICD-10-CM

## 2021-02-23 DIAGNOSIS — I10 Essential (primary) hypertension: Principal | ICD-10-CM
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
## 2021-03-04 ENCOUNTER — Telehealth
Admit: 2021-03-04 | Discharge: 2021-03-05 | Payer: PRIVATE HEALTH INSURANCE | Attending: Registered" | Primary: Registered"

## 2021-03-04 DIAGNOSIS — E669 Obesity, unspecified: Principal | ICD-10-CM

## 2021-03-04 DIAGNOSIS — B20 Human immunodeficiency virus [HIV] disease: Principal | ICD-10-CM

## 2021-03-04 DIAGNOSIS — I10 Essential (primary) hypertension: Principal | ICD-10-CM

## 2021-03-04 NOTE — Unmapped (Addendum)
Nutrition Goals:  1. Bring more awareness to condiments, additives:  -measure out butters, oils, fats, mayonnaises   2. Use HALT technique during emotional eating episodes:  -Hungry Angry Lonely Tired for 3-5 seconds before eating  3. Regular meal times to mitigate overeating at meal times:  -7am to 8am: breakfast, 3-4pm lunch, 7-8pm dinner  4. Use myplate guidelines at meal times:  -1/2 plate non-starchy vegetables, 1/4 plate lean protein, 1/4 whole-grain carbohydrates  5. Regular physical activity: 30 minutes, 5x per week (walking, biking, body weight exercises).  6. Reduce consumption of processed carbohydrates from breads, cookies, pre-packaged oatmeal packets and sweetened cereals.  Patient Education        Eating Healthy Foods: Care Instructions  Your Care Instructions     Eating healthy foods can help lower your risk for disease. Healthy food gives you energy and keeps your heart strong, your brain active, your muscles working, and your bones strong.  A healthy diet includes a variety of foods from the basic food groups: grains, vegetables, fruits, milk and milk products, and meat and beans. Some people may eat more of their favorite foods from only one food group and, as a result, miss getting the nutrients they need. So, it is important to pay attention not only to what you eat but also to what you are missing from your diet. You can eat a healthy, balanced diet by making a few small changes.  Follow-up care is a key part of your treatment and safety. Be sure to make and go to all appointments, and call your doctor if you are having problems. It's also a good idea to know your test results and keep a list of the medicines you take.  How can you care for yourself at home?  Look at what you eat  Keep a food diary for a week or two and record everything you eat or drink. Track the number of servings you eat from each food group.  For a balanced diet every day, eat a variety of:  6 or more ounce-equivalents of grains, such as cereals, breads, crackers, rice, or pasta, every day. An ounce-equivalent is 1 slice of bread, 1 cup of ready-to-eat cereal, or ?? cup of cooked rice, cooked pasta, or cooked cereal.  2?? cups of vegetables, especially:  Dark-green vegetables such as broccoli and spinach.  Orange vegetables such as carrots and sweet potatoes.  Dry beans (such as pinto and kidney beans) and peas (such as lentils).  2 cups of fresh, frozen, or canned fruit. A small apple or 1 banana or orange equals 1 cup.  3 cups of nonfat or low-fat milk, yogurt, or other milk products.  5?? ounces of meat and beans, such as chicken, fish, lean meat, beans, nuts, and seeds. One egg, 1 tablespoon of peanut butter, ?? ounce nuts or seeds, or ?? cup of cooked beans equals 1 ounce of meat.  Learn how to read food labels for serving sizes and ingredients. Fast-food and convenience-food meals often contain few or no fruits or vegetables. Make sure you eat some fruits and vegetables to make the meal more nutritious.  Look at your food diary. For each food group, add up what you have eaten and then divide the total by the number of days. This will give you an idea of how much you are eating from each food group. See if you can find some ways to change your diet to make it more healthy.  Start small  Do  not try to make dramatic changes to your diet all at once. You might feel that you are missing out on your favorite foods and then be more likely to fail.  Start slowly, and gradually change your habits. Try some of the following:  Use whole wheat bread instead of white bread.  Use nonfat or low-fat milk instead of whole milk.  Eat brown rice instead of white rice, and eat whole wheat pasta instead of white-flour pasta.  Try low-fat cheeses and low-fat yogurt.  Add more fruits and vegetables to meals and have them for snacks.  Add lettuce, tomato, cucumber, and onion to sandwiches.  Add fruit to yogurt and cereal.  Enjoy food  You can still eat your favorite foods. You just may need to eat less of them. If your favorite foods are high in fat, salt, and sugar, limit how often you eat them, but do not cut them out entirely.  Eat a wide variety of foods.  Make healthy choices when eating out  The type of restaurant you choose can help you make healthy choices. Even fast-food chains are now offering more low-fat or healthier choices on the menu.  Choose smaller portions, or take half of your meal home.  When eating out, try:  A veggie pizza with a whole wheat crust or grilled chicken (instead of sausage or pepperoni).  Pasta with roasted vegetables, grilled chicken, or marinara sauce instead of cream sauce.  A vegetable wrap or grilled chicken wrap.  Broiled or poached food instead of fried or breaded items.  Make healthy choices easy  Buy packaged, prewashed, ready-to-eat fresh vegetables and fruits, such as baby carrots, salad mixes, and chopped or shredded broccoli and cauliflower.  Buy packaged, presliced fruits, such as melon or pineapple.  Choose 100% fruit or vegetable juice instead of soda. Limit juice intake to 4 to 6 oz (?? to ?? cup) a day.  Blend low-fat yogurt, fruit juice, and canned or frozen fruit to make a smoothie for breakfast or a snack.  Where can you learn more?  Go to My[facility]Chart at https://myuncchart.Armed forces logistics/support/administrative officer in the Menu. Enter T756 in the search box to learn more about Eating Healthy Foods: Care Instructions.  Current as of: May 28, 2020               Content Version: 13.2  ?? 2006-2022 [employer]wise[employer].   Care instructions adapted under license by [facility]. If you have questions about a medical condition or this instruction, always ask your healthcare professional. Healthwise, Incorporated disclaims any warranty or liability for your use of this information.

## 2021-03-04 NOTE — Unmapped (Signed)
[facility]  Adult Medical Nutrition Therapy - Initial Assessment/Care Plan  Date: 03/04/2021    Patient Name:             Karen Strickland    Date of Birth / Age / Birth Gender:            1966-07-20 / 55 y.o. / Female    Referring MD or Clinic:             Varney Daily I, FNP      Interpreter:            No      Reason for Referral:   General, healthful diet order yes: weight management, weight reduction and hypertension      Nutritional Concerns/Comments:  55 y.o. female seeking nutritional counseling for weight management, weight reduction, hypertension.    Pt reports to initial nutrition visit with BMI of 37.46 kg/m2 (obese class II) and hypertension interested in nutritional counseling to mitigate weight gain and blood pressure. Pt also interested in general nutrition education for women over 62 years of age.     Since 2004 dx of HIV, pt has noticed increased subcutaneous tissue in abdominal area (possible lipodystrophy?) which is making her less confident and impeding on ADL's. Pt interested in possible medication and/or surgical interventions to reduce abdominal fat accumulation.    At today's visit, pt discussed ongoing emotional eating behaviors and lack of portion control with certain foods (breads, cookies). Pt reports she knows her trigger foods and will avoid the grocery stores that carry those foods to decrease her chance of overeating. RD discussed HALT technique, mindful eating practices, CBT to mitigate these episodes.     RD also discussed modifying intake of calorie-rich additives (butters, oils, mayonnaises) to help decrease overall caloric intake in conjunction with using the plate method, reduction of added sugars/processed carbohydrate intake. Pt seems very receptive to adhering to nutrition-related recommendations.    WT HISTORY: Pt reports 245# is her usual weight. Pt was able to lose ~20 lbs in 2021 on a ketogenic-type diet (more vegetables, protein and reduced fat). Pt unable to adhere to diet and since regained weight.    DIET: For breakfast, pt will have eggs, toast, sausage with butter. For lunch, pt will have a bologna sandwich. For dinner, pt will have a bologna sandwich.    PHYSICAL ACTIVITY: None reported.    FOOD INSECURITY: Pt screens positive. Pt does not receive SNAP benefits nor accesses a food pantry.    Anthropometric Data:   Height:   5'8   Weight:   246 lbs   BMI:   37.46 kg/m2                 Weight History:               Wt Readings from Last 6 Encounters:   02/11/21 (!) 111.8 kg (246 lb 6.4 oz)       Usual body weight: 245 lbs  Ideal Body Weight:  140 lbs  Goal weight: <200 lbs      Physical Findings Data:               BP Today: (telephone encounter)                 BP History:                 BP Readings from Last 6 Encounters:   02/11/21 133/92  Biochemical Data:              A1C:  No results found for: A1C              Estimated Blood Glucose: No results found for: EAG             Total Cholesterol: No results found for: CHOL             Trig: No results found for: TRIG              HDL: No results found for: HDL               LDL: No results found for: LDL    Current Medications, Herbs, Supplements:    Current Outpatient Medications:   ???  amLODIPine (NORVASC) 5 MG tablet, Take 2.5 mg by mouth., Disp: , Rfl:   ???  emtricitab-rilpivir-tenofo ala (ODEFSEY) 200-25-25 mg Tab, Take 1 tablet by mouth in the morning with a meal., Disp: 30 tablet, Rfl: 5  ???  meloxicam (MOBIC) 7.5 MG tablet, , Disp: , Rfl:   ???  oxybutynin (DITROPAN-XL) 10 MG 24 hr tablet, Continuous., Disp: , Rfl:   ???  triamterene-hydrochlorothiazide (DYAZIDE) 37.5-25 mg per capsule, Continuous., Disp: , Rfl:   ???  triamterene-hydrochlorothiazide (MAXZIDE-25) 37.5-25 mg per tablet, Take 0.5 tablets by mouth in the morning., Disp: , Rfl:   ???  valACYclovir (VALTREX) 500 MG tablet, Continuous., Disp: , Rfl:   ???  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 For each statement, please tell me whether the statement was often true, sometimes true, or never true for your household in the last month.     1. ???We worried whether our food would run out before we got money to buy more.???   Often True     2. ???The food that we bought just didn't last, and we didn't have money to get more.  Often True    Usual Daily Food Choices:    Breakfast: eggs, sausage and toast with butter  Snack AM:  Lunch: sandwich  Snack PM:  Dinner: sandwich  Snack PM:    24-Hour Recall/Usual Intake:  Time Intake   Breakfast Eggs, sausage and toast with butter   Snack (AM)    Lunch Control and instrumentation engineer (PM)    Dinner Bologna sandwich   Snack (HS)        Food and Nutrient Intake:  Snacks: cookies, breads  Beverages:  water  Dining Out:  n/a  Cooking Methods: saute, baking, broiling, frying  Usual Food Choices: bread, vegetables, eggs, sausage  Meal Schedule:  Breakfast, lunch and dinner    Behavioral Factors:  Overeating: Lacks portion control with meals and snacks.   Emotional Eating: She noted eating to deal with feelings other than hunger including boredom, stress, anxiety, being tired .  Grazing: Denied issues.   Fast Eating: Denied issues.   Nighttime Eating: None.    Factors Affecting Food Intake:  Knowledge and Beliefs: She has not previously met with dietitian or participated in nutrition program.  She has willingness to try new foods.  Stress/Anxiety: Did not report issues with stress/anxiety.   Sleep Patterns: Did not report issues with sleeping habits and patterns.  Food Safety and Access: She did not report issues. Other: n/a        Food Intolerances/Dietary Restrictions:  No known food allergies or food intolerances.     Other GI Issues:  Denied issues    Hunger and Satiety:  Denied issues.       Allergies:  has No Known Allergies.      Usual Daily Physical Activity Factors: >/=1.0 to <1.4 (Sedentary)  Method for estimating physical activity factor: 2015-2020 DRI Guidelines    Estimated Energy Needs:    2300 kcal Resting Metabolic Rate by Mifflin-St Jeor equation  -500 kcal Calorie difference for weight goal  1800 kcal Total energy estimated need for weight goal    Protein: 112-134 g/kg [1.0-1.2 g/kg]  Carbohydrate: 40-65%  Fat: total fat: 20-35% of kcal, saturated fat: <10% of kcal , trans fat: as low as possible  Fiber: 25-30 g  Sodium:  <2400 mg   Cholesterol: <200 mg  Added Sugars: <10% of kcal   Fluid: 1800-2300 {22ml/kcal]    Estimated Adherence:  Client self-reported adherence score: 10  Method for estimating adherence: Adult Meducation Readiness Ruler      NUTRITION DIAGNOSIS:   Excessive  energy intake   Inadequate  intake of grains  Inadequate  intake of fruits  Inadequate  intake of vegetables  Inadequate  intake of milk/milk products  Inadequate  intake of meat, poultry, fish, eggs, beans, nut products    Method for estimating nutritional adequacy: 2015-2020 DRI Guidelines     Clinical Diagnosis:   Overweight/obesity related to undesirable food choices, excessive calorie intake as evidenced by most recent BMI of 37.46 kg/m2 and self-reported overeating on processed carbohydrates 2-3 meals per day.            NUTRITION INTERVENTIONS/PLAN OF CARE    Nutrition Counseling     Nutrition Education     Realistic weight goal setting: 0.5 to up to 2 lbs per week.                                                   Comprehensive weight management program includes a reduced calorie diet, increased fruit/vegetable/whole grain consumption and increased physical activity by 30 minutes, 5x per week.    Nutrient adequacy during weight loss provided for in individualized meal plan that includes client???s preferences: energy deficit of 250-500 kcal for a total caloric consumption of 1800 kcal/day.    Behavior therapy strategies of self-monitoring food intake, physical activity and goal setting.    Coordination of care via Mail and Medical Record    Client agreed to follow interventions:  Yes       Nutrition-Related Medication Management: Nutrition-related complimentary/alternative medicine      Nutrition Goals:  1. Bring more awareness to condiments, additives:  -measure out butters, oils, fats, mayonnaises   2. Use HALT technique during emotional eating episodes:  -Hungry Angry Lonely Tired for 3-5 seconds before eating  3. Regular meal times to mitigate overeating at meal times:  -7am to 8am: breakfast, 3-4pm lunch, 7-8pm dinner  4. Use myplate guidelines at meal times:  -1/2 plate non-starchy vegetables, 1/4 plate lean protein, 1/4 whole-grain carbohydrates  5.  Regular physical activity: 30 minutes, 5x per week (walking, biking, body weight exercises).  6. Reduce consumption of processed carbohydrates from breads, cookies, pre-packaged oatmeal packets and sweetened cereals.    Patient Education:  Individualized nutrition counseling and education provided on:   MyPlate Guidelines  Meal Regularity  Physical Activity  Processed Carbohydrates  Added Sugars    Materials Provided:  Patient instructions: nutrition-related recommendations  Handouts supporting dietary recommendations:   Healthy Diet  Additional Materials:   Written nutrition tips , Online resources         NUTRITION MONITORING AND EVALUATION:  Follow-up appointment: telephone/video in 2 weeks (June 29th at 12pm)      Goals for next appointment:   1. Bring more awareness to condiments, additives:  -measure out butters, oils, fats, mayonnaises   2. Use HALT technique during emotional eating episodes:  -Hungry Angry Lonely Tired for 3-5 seconds before eating  3. Regular meal times to mitigate overeating at meal times:  -7am to 8am: breakfast, 3-4pm lunch, 7-8pm dinner  4. Use myplate guidelines at meal times:  -1/2 plate non-starchy vegetables, 1/4 plate lean protein, 1/4 whole-grain carbohydrates  5. Regular physical activity: 30 minutes, 5x per week (walking, biking, body weight exercises).  6. Reduce consumption of processed carbohydrates from breads, cookies, pre-packaged oatmeal packets and sweetened cereals.                                       Number of preferred RD visits per dx: 12-14 visits    Plan of care time frame: 6-12 months    Length of visit was: 55 minutes        The patient reports they are currently: at home. I spent 55 minutes on the phone with the patient on the date of service. I spent an additional 10 minutes on pre- and post-visit activities on the date of service.     The patient was physically located in [place] or a state in which I am permitted to provide care. The patient and/or parent/guardian understood that s/he may incur co-pays and cost sharing, and agreed to the telemedicine visit. The visit was reasonable and appropriate under the circumstances given the patient's presentation at the time.    The patient and/or parent/guardian has been advised of the potential risks and limitations of this mode of treatment (including, but not limited to, the absence of in-person examination) and has agreed to be treated using telemedicine. The patient's/patient's family's questions regarding telemedicine have been answered.     If the visit was completed in an ambulatory setting, the patient and/or parent/guardian has also been advised to contact their provider???s office for worsening conditions, and seek emergency medical treatment and/or call 911 if the patient deems either necessary.    Signed:  Unk Pinto  03/04/2021 11:06 AM

## 2021-03-05 NOTE — Unmapped (Signed)
[facility] Clinical Assessment & Refill Coordination Note    KINDRED Karen Strickland, DOB: 02/06/1966  Phone: [phone] (home)     All above HIPAA information was verified with patient.     Was a Nurse, learning disability used for this call? No    Specialty Medication(s):   Infectious Disease: [id number]     Current Outpatient Medications   Medication Sig Dispense Refill   ??? amLODIPine (NORVASC) 5 MG tablet Take 2.5 mg by mouth.     ??? emtricitab-rilpivir-tenofo ala (ODEFSEY) 200-25-25 mg Tab Take 1 tablet by mouth in the morning with a meal. 30 tablet 5   ??? meloxicam (MOBIC) 7.5 MG tablet      ??? oxybutynin (DITROPAN-XL) 10 MG 24 hr tablet Continuous.     ??? triamterene-hydrochlorothiazide (DYAZIDE) 37.5-25 mg per capsule Continuous.     ??? triamterene-hydrochlorothiazide (MAXZIDE-25) 37.5-25 mg per tablet Take 0.5 tablets by mouth in the morning.     ??? valACYclovir (VALTREX) 500 MG tablet Continuous.     ??? venlafaxine (EFFEXOR-XR) 150 MG 24 hr capsule Continuous.       No current facility-administered medications for this visit.        Changes to medications: Rafael reports no changes at this time.    No Known Allergies    Changes to allergies: No    SPECIALTY MEDICATION ADHERENCE     Odefsey   : 9 days of medicine on hand       Medication Adherence    Patient reported X missed doses in the last month: 0  Specialty Medication: [id number]  Patient is on additional specialty medications: No  Demonstrates understanding of importance of adherence: yes  Informant: patient  Provider-estimated medication adherence level: good  Patient is at risk for Non-Adherence: No          Specialty medication(s) dose(s) confirmed: Regimen is correct and unchanged.     Are there any concerns with adherence? No    Adherence counseling provided? Not needed    CLINICAL MANAGEMENT AND INTERVENTION      Clinical Benefit Assessment:    Do you feel the medicine is effective or helping your condition? Yes    Clinical Benefit counseling provided? Not needed    Adverse Effects Assessment:    Are you experiencing any side effects? Possibly.      ?? She reported having a rash that went away after putting hydrocortisone on it and has not returned.  I told her if it comes back to let the clinic know because I do not want it to become more severe.  ?? She reported having some depression but thinks it was related to her job that she has currently left and will be starting a different job soon. She is on vacation now.    Are you experiencing difficulty administering your medicine? No    Quality of Life Assessment:    How many days over the past month did your HIV  keep you from your normal activities? For example, brushing your teeth or getting up in the morning. 0    Have you discussed this with your provider? Not needed    Acute Infection Status:    Acute infections noted within Epic:  No active infections  Patient reported infection: None    Therapy Appropriateness:    Is therapy appropriate? Yes, therapy is appropriate and should be continued    DISEASE/MEDICATION-SPECIFIC INFORMATION      N/A    PATIENT SPECIFIC NEEDS     -  Does the patient have any physical, cognitive, or cultural barriers? No    - Is the patient high risk? No    - Does the patient require a Care Management Plan? No     - Does the patient require physician intervention or other additional services (i.e. nutrition, smoking cessation, social work)? No      SHIPPING     Specialty Medication(s) to be Shipped:   Infectious Disease: Odefsey    Other medication(s) to be shipped: No additional medications requested for fill at this time     Changes to insurance: No    Delivery Scheduled: Yes, Expected medication delivery date: 03/10/21.     Medication will be delivered via UPS to the confirmed prescription address in [facility].    The patient will receive a drug information handout for each medication shipped and additional FDA Medication Guides as required.  Verified that patient has previously received a Conservation officer, historic buildings and a Surveyor, mining.    The patient or caregiver noted above participated in the development of this care plan and knows that they can request review of or adjustments to the care plan at any time.      All of the patient's questions and concerns have been addressed.    Roderic Palau   [facility] Specialty Pharmacist

## 2021-03-06 NOTE — Unmapped (Addendum)
Medical Case Management Discharge Note    Duration of Intervention: 5 minutes    Talked with Patient? NO    TYPE OF CONTACT: Phone    REASON FOR DISCHARGE: Completed care plan goals    EFFECTIVE DATE:  03/06/21    OUTSIDE REFERRALS MADE: No. Kyandra expressed interest in establishing with a psychiatrist at her first visit, but she is leaving her current job and will be without insurance until she starts her new job. Once she has insurance again, she will reach out to the ID Clinic for assistance with a referral.      Rochell Puett, LCSW, CCM

## 2021-03-08 LAB — HIV GENOTYPING (~~LOC~~ PROVIDERS ONLY)

## 2021-03-08 LAB — GENOSURE INTEGRASE

## 2021-03-09 MED FILL — ODEFSEY 200 MG-25 MG-25 MG TABLET: ORAL | 30 days supply | Qty: 30 | Fill #1

## 2021-04-01 NOTE — Unmapped (Signed)
Nutrition Goals:  1. Regular meal schedule: 3 meals with 2 small snacks or 6 small meals to mitigate cravings and portions:  -7am to 8am: breakfast, 3-4pm lunch, 7-8pm dinner  2. Bring more awareness to condiments, additives:  -measure out butters, oils, fats, mayonnaises   3. Use HALT technique during emotional eating episodes:  -Hungry Angry Lonely Tired for 3-5 seconds before eating  4. Use myplate guidelines at meal times:  -1/2 plate non-starchy vegetables, 1/4 plate lean protein, 1/4 whole-grain carbohydrates  5. Regular physical activity: 30 minutes, 5x per week (walking, biking, body weight exercises).  6. Reduce consumption of processed carbohydrates from breads, cookies, pre-packaged oatmeal packets and sweetened cereals and replace with more whole, unprocessed food choices such as fruits, nuts, beans, lentils, low-fat dairy, eggs and lean proteins.

## 2021-04-01 NOTE — Unmapped (Signed)
RD called pt to discuss progress after initial nutrition visit.   Pt reports she is under a lot of stress with her workplace which is causing her to have emotional eating episodes and non-adherence to nutrition-related recommendations.   RD discussed regular meal schedule, healthier meal choices, stress management, physical activity to mitigate ongoing external stress.     Nutrition Goals:  1. Regular meal schedule: 3 meals with 2 small snacks or 6 small meals to mitigate cravings and portions:  -7am to 8am: breakfast, 3-4pm lunch, 7-8pm dinner  2. Bring more awareness to condiments, additives:  -measure out butters, oils, fats, mayonnaises   3. Use HALT technique during emotional eating episodes:  -Hungry Angry Lonely Tired for 3-5 seconds before eating  4. Use myplate guidelines at meal times:  -1/2 plate non-starchy vegetables, 1/4 plate lean protein, 1/4 whole-grain carbohydrates  5. Regular physical activity: 30 minutes, 5x per week (walking, biking, body weight exercises).  6. Reduce consumption of processed carbohydrates from breads, cookies, pre-packaged oatmeal packets and sweetened cereals and replace with more whole, unprocessed food choices such as fruits, nuts, beans, lentils, low-fat dairy, eggs and lean proteins.    Time of Intervention: 12 minutes    Unk Pinto, MS, RD, LDN

## 2021-04-06 DIAGNOSIS — B20 Human immunodeficiency virus [HIV] disease: Principal | ICD-10-CM

## 2021-04-06 NOTE — Unmapped (Signed)
Patient called in and has 11 days (including today) of Odefsey on hand. However, she no longer has insurance. Sent triage request to enter referral.

## 2021-04-09 ENCOUNTER — Ambulatory Visit: Admit: 2021-04-09 | Payer: PRIVATE HEALTH INSURANCE

## 2021-04-17 NOTE — Unmapped (Signed)
Spoke with patient. She has 3 days remaining of Odefsey. She reports she is aware of paperwork needed for [employer] for manufacturer assistance and will receive her 3rd check stub on 8/5, and will bring what she can to her appointment on 8/5. Patient reports she is aware she will miss some doses of her medication, but says that she can't do anything about that. No questions for the pharmacist.

## 2021-04-23 NOTE — Unmapped (Signed)
Internal Medicine Initial Visit        Assessment/Plan:     Karen Strickland presents today to establish care.    {TIP - [facility]- RAFF Pilot- Clinical Documentation Specialist Recommendations-  No specialty comments available.   This text will self delete upon signing note:75688}         No diagnosis found.    1. ***    2. ***    3. ***    Staffed with Dr. ***, {seen/discussed:75519}    No follow-ups on file.        Chief Complaint:      Karen Strickland is a 55 y.o. female who presents to Establish Care for ***      Subjective:     HPI  ***      The past medical history, surgical history, family history, social history, medications and allergies were reviewed in Epic.     Review of Systems    The balance of 10 systems was reviewed and unremarkable except as stated above.        Objective:     There were no vitals taken for this visit.     General: ***  HEENT: ***  Eyes: ***  Cardiovascular: ***  Pulmonary: ***  Gastrointestinal: ***  Musculoskeletal: ***  Skin: ***  Back: ***  ***      Records Review ***    PHQ-9 Score:     GAD-7 Score:         Medication adherence and barriers to the treatment plan have been addressed. Opportunities to optimize healthy behaviors have been discussed. Patient / caregiver voiced understanding. --Continue meeting with therapy     HTN   Well-controlled.  --Refill Norvasc 2.5mg  daily    --Refill Maxzide 37.5-25mg  (1/2 tab) daily      HIV   Initially diagnosed in 2004 and follows with [facility]. Most recent regimen has been Rilpivirine and Truvada. Patient has had access issues due to cost and missed about 3 months of medications but when she has access does consistently take them. Most recent CD4 on 02/11/21 was 54 and HIV RNA 65,626.   --Continue Odefsey (combo drug w/ rilpivirine/Truvada)   --Following up with ID this afternoon   --F/u Syphilis and GC      Hx of Genital Herpes   No recent lesions and has been suppressed with Valtrex   --Continue Valtrex 500mg  daily     Overactive Bladder   --Continue Oxybutynin     Staffed with Dr. [name] Pugh, seen and discussed    Return in about 6 weeks (around 06/05/2021).    Chief Complaint:      Karen Strickland is a 55 y.o. female who presents to Establish Care for management of her mood disorder.       Subjective:     HPI  Karen Strickland is a 55 year old female with a past medical history of HIV, hypertension, anxiety/depression, and overactive bladder who presents today to establish care with a PCP and to discuss her mood disorder.    Patient tells me that she is originally from [place] and recently transferred all of her care over to [facility].  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  Karen Strickland informs me that this past year has been particularly traumatic for her.  In April of this year, her nephew was murdered.  In addition, up until 2 weeks ago she was also living with  a female roommate and states this situation had been very stressful for her.  Patient endorses feelings of depression with decreased energy levels and sometimes sleep disturbance.  She has been on Effexor in the past which she states has been helpful but had run out of this medication for the past month.  She also does see a therapist regularly and states this has been an important part of her dealing with grief and her depression/anxiety.  She denies any thoughts of self-harm or harm to others.    She also would like to discuss some left thigh pain numbness that has been ongoing for the past month.  She has had this issue before and it previously resolved without intervention.  She describes a sensation of numbness, burning, and pins/needles to her left thigh.  She denies any recent trauma.  No issues with weightbearing.  No rash or redness noted to the area.    Karen Strickland would also like to get tested for further STIs today.  She currently has a partner that she has been with for over 6 months but reports that 2 to 3 months ago she did have sexual intercourse with a different high risk partner without the use of condoms.  She states this was a one-time occurrence.  In addition, given her extensive family history of heart disease and diabetes she would like to have her A1c checked today.    Otherwise socially, Karen Strickland is a never smoker and denies any drug use.  She endorses very minimal alcohol use.    No fevers, chills, abdominal pain, urinary changes, diarrhea, or constipation.    The past medical history, surgical history, family history, social history, medications and allergies were reviewed in Epic.     Review of Systems    The balance of 10 systems was reviewed and unremarkable except as stated above.        Objective:     BP (P) 123/81 (BP Site: L Arm, BP Position: Sitting, BP Cuff Size: Large)  - Pulse (P) 75  - Temp 35.8 ??C (96.4 ??F)  - Wt (!) 111.9 kg (246 lb 9.6 oz)  - SpO2 98%  - BMI 37.50 kg/m??      General: Well-appearing, does become teary-eyed at times during interview but otherwise in NAD   HEENT: EOMI, oropharynx clear, TMs intact, moist mucous membranes   Eyes: No scleral icterus, normal conjunctiva    Cardiovascular: RRR, no murmurs, no rubs, no gallops, warm peripherals   Pulmonary: Lungs clear to auscultation bilaterally, no wheezing, no crackles, normal work of breathing   Gastrointestinal: Soft, non-tender, non-distended  Musculoskeletal: 5/5 strength throughout, no spinal tenderness, normal range of motion throughout    Skin: No rashes or lesions, warm/dry/intact   Neuro: AOx4, sensation intact, no focal neurodeficits       Records Reviewed    PHQ-9 Score:  PHQ-9 TOTAL SCORE: 6  GAD-7 Score:  GAD-7 Total Score: 18      Medication adherence and barriers to the treatment plan have been addressed. Opportunities to optimize healthy behaviors have been discussed. Patient / caregiver voiced understanding.    Mack Guise, MD MPH   PGY-2 - Internal Medicine

## 2021-04-24 ENCOUNTER — Ambulatory Visit: Admit: 2021-04-24 | Discharge: 2021-04-24 | Payer: PRIVATE HEALTH INSURANCE

## 2021-04-24 ENCOUNTER — Ambulatory Visit
Admit: 2021-04-24 | Discharge: 2021-04-24 | Payer: PRIVATE HEALTH INSURANCE | Attending: Student in an Organized Health Care Education/Training Program | Primary: Student in an Organized Health Care Education/Training Program

## 2021-04-24 ENCOUNTER — Encounter: Admit: 2021-04-24 | Discharge: 2021-04-24 | Payer: PRIVATE HEALTH INSURANCE | Attending: Family | Primary: Family

## 2021-04-24 ENCOUNTER — Encounter: Admit: 2021-04-24 | Discharge: 2021-04-24 | Payer: PRIVATE HEALTH INSURANCE

## 2021-04-24 DIAGNOSIS — B20 Human immunodeficiency virus [HIV] disease: Principal | ICD-10-CM

## 2021-04-24 DIAGNOSIS — I10 Essential (primary) hypertension: Principal | ICD-10-CM

## 2021-04-24 DIAGNOSIS — F32A Depression, unspecified depression type: Principal | ICD-10-CM

## 2021-04-24 DIAGNOSIS — Z131 Encounter for screening for diabetes mellitus: Principal | ICD-10-CM

## 2021-04-24 DIAGNOSIS — Z113 Encounter for screening for infections with a predominantly sexual mode of transmission: Principal | ICD-10-CM

## 2021-04-24 DIAGNOSIS — Z0000 Encounter for general adult medical examination without abnormal findings: Principal | ICD-10-CM

## 2021-04-24 LAB — CBC W/ AUTO DIFF
BASOPHILS ABSOLUTE COUNT: 0 10*9/L (ref 0.0–0.1)
BASOPHILS RELATIVE PERCENT: 0.9 %
EOSINOPHILS ABSOLUTE COUNT: 0.2 10*9/L (ref 0.0–0.5)
EOSINOPHILS RELATIVE PERCENT: 3.6 %
HEMATOCRIT: 36.6 % (ref 34.0–44.0)
HEMOGLOBIN: 11.9 g/dL (ref 11.3–14.9)
LYMPHOCYTES ABSOLUTE COUNT: 1.8 10*9/L (ref 1.1–3.6)
LYMPHOCYTES RELATIVE PERCENT: 38.8 %
MEAN CORPUSCULAR HEMOGLOBIN CONC: 32.6 g/dL (ref 32.0–36.0)
MEAN CORPUSCULAR HEMOGLOBIN: 28.4 pg (ref 25.9–32.4)
MEAN CORPUSCULAR VOLUME: 87 fL (ref 77.6–95.7)
MEAN PLATELET VOLUME: 8.7 fL (ref 6.8–10.7)
MONOCYTES ABSOLUTE COUNT: 0.3 10*9/L (ref 0.3–0.8)
MONOCYTES RELATIVE PERCENT: 5.3 %
NEUTROPHILS ABSOLUTE COUNT: 2.4 10*9/L (ref 1.8–7.8)
NEUTROPHILS RELATIVE PERCENT: 51.4 %
PLATELET COUNT: 391 10*9/L (ref 150–450)
RED BLOOD CELL COUNT: 4.21 10*12/L (ref 3.95–5.13)
RED CELL DISTRIBUTION WIDTH: 15.2 % (ref 12.2–15.2)
WBC ADJUSTED: 4.7 10*9/L (ref 3.6–11.2)

## 2021-04-24 LAB — URINALYSIS WITH CULTURE REFLEX
BILIRUBIN UA: NEGATIVE
BLOOD UA: NEGATIVE
GLUCOSE UA: NEGATIVE
HYALINE CASTS: 10 /LPF — ABNORMAL HIGH (ref 0–1)
KETONES UA: NEGATIVE
NITRITE UA: NEGATIVE
PH UA: 5.5 (ref 5.0–9.0)
RBC UA: 3 /HPF (ref <=4)
SPECIFIC GRAVITY UA: 1.027 (ref 1.003–1.030)
SQUAMOUS EPITHELIAL: 4 /HPF (ref 0–5)
TRANSITIONAL EPITHELIAL: <1 /HPF (ref 0–2)
UROBILINOGEN UA: <2
WBC UA: 18 /HPF — ABNORMAL HIGH (ref 0–5)

## 2021-04-24 LAB — BASIC METABOLIC PANEL
ANION GAP: 6 mmol/L (ref 5–14)
BLOOD UREA NITROGEN: 16 mg/dL (ref 9–23)
BUN / CREAT RATIO: 16
CALCIUM: 9.4 mg/dL (ref 8.7–10.4)
CHLORIDE: 104 mmol/L (ref 98–107)
CO2: 29 mmol/L (ref 20.0–31.0)
CREATININE: 1.01 mg/dL — ABNORMAL HIGH
EGFR CKD-EPI (2021) FEMALE: 65 mL/min/{1.73_m2} (ref >=60)
GLUCOSE RANDOM: 106 mg/dL — ABNORMAL HIGH (ref 70–99)
POTASSIUM: 3.2 mmol/L — ABNORMAL LOW (ref 3.4–4.8)
SODIUM: 139 mmol/L (ref 135–145)

## 2021-04-24 LAB — HEMOGLOBIN A1C
ESTIMATED AVERAGE GLUCOSE: 126 mg/dL
HEMOGLOBIN A1C: 6 % — ABNORMAL HIGH (ref 4.8–5.6)

## 2021-04-24 LAB — AST: AST (SGOT): 33 U/L (ref <=34)

## 2021-04-24 LAB — LIPID PANEL
CHOLESTEROL/HDL RATIO SCREEN: 2.6 (ref 1.0–4.5)
CHOLESTEROL: 141 mg/dL (ref <=200)
HDL CHOLESTEROL: 54 mg/dL (ref 40–60)
LDL CHOLESTEROL CALCULATED: 61 mg/dL (ref 40–99)
NON-HDL CHOLESTEROL: 87 mg/dL (ref 70–130)
TRIGLYCERIDES: 131 mg/dL (ref 0–150)
VLDL CHOLESTEROL CAL: 26.2 mg/dL (ref 11–40)

## 2021-04-24 LAB — ALT: ALT (SGPT): 27 U/L (ref 10–49)

## 2021-04-24 LAB — BILIRUBIN, TOTAL: BILIRUBIN TOTAL: 0.3 mg/dL (ref 0.3–1.2)

## 2021-04-24 MED ORDER — VENLAFAXINE ER 150 MG CAPSULE,EXTENDED RELEASE 24 HR
ORAL_CAPSULE | Freq: Every day | ORAL | 2 refills | 90 days | Status: CP
Start: 2021-04-24 — End: ?
  Filled 2021-04-24: qty 90, 90d supply, fill #0

## 2021-04-24 MED ORDER — TRIAMTERENE 37.5 MG-HYDROCHLOROTHIAZIDE 25 MG TABLET
ORAL_TABLET | Freq: Every day | ORAL | 3 refills | 90.00000 days | Status: CP
Start: 2021-04-24 — End: ?
  Filled 2021-04-24: qty 45, 90d supply, fill #0

## 2021-04-24 NOTE — Unmapped (Signed)
COVID Education:  Make sure you perform good hand washing (lasting 20 seconds), continue to social distance and limit close personal contact (which may include new sexual partners or having multiple partners during this period).  Try to isolate at home but please find ways to keep in touch with those close to you, such as meeting up with them electronically or socially distanced, and the ability to go outdoors alone or separated from others  If you become ill with fever, respiratory illness, sudden loss of taste and smell, stomach issues, diarrhea, nausea, vomiting - contact clinic for further instructions.  You should go to the emergency department if you develop systems such as shortness of breath, confusion, lightheadedness when standing, high fever.   Here is some information about HIV and CoVid vaccines: [email or web address]  If you're interested in receiving the CoVid vaccine when you're eligible, here are some resources for you to check and make an appointment:  Your local health department   [email or web address] through [facility]  [email or web address]  [facility] (if you are an established patient with them)  [email or web address]    URGENT CARE  Please call ahead to speak with the nursing staff if you are in need of an urgent appointment.       MEDICATIONS  For refills please contact your pharmacy and ask them to electronically send or fax the request to the clinic.   Please bring all medications in original bottles to every appointment.    [employer] (formerly [employer]) or [employer] Eligibility (required even if you do not receive medication through [facility])  Please remember to renew your Juanell Fairly eligibility during renewal periods which occur twice a year: January-March and July-September.     The following are needed for each renewal:   - [facility] Identification (if you don't have one, then a bill with your name and address in [place])   - proof of income (award letter, W-2, or last three check stubs)   If you are unable to come in for renewal, let us know if we can mail, fax or e-mail paperwork to you.   HMAP Contact: [phone].     Lab info:  Your most recent CD4 T-cell counts and viral loads are below. Here are a few things to keep in mind when looking at your numbers:  Our goal is to get your virus to be undetectable and keep it undetectable. If the virus is undetectable you are much more likely to stay healthy.  We consider your viral load to be undetectable if it says <40 or if it says Not detected.  For most people, we're checking CD4 counts every other visit (once or twice a year, or sometimes even less).  It's normal for your CD4 count to be different from visit to visit.   You can help by taking your medications at about the same time, every single day. If you're having trouble with taking your medications, it's important to let us know.    Lab Results   Component Value Date    ACD4 702 02/11/2021    CD4 54 02/11/2021    HIVCP 65,626 (H) 02/11/2021    HIVRS Detected (A) 02/11/2021        Please note that your laboratory and other results may be visible to you in real time, possibly before they reach your provider. Please allow 48 hours for clinical interpretation of these results. Importantly, even if a result is flagged as abnormal, it may not be one that impacts  your health.    It was nice to have a visit with you today!  Follow-up information:  You can either email, text or fax information over. If you get paid bi-weekly, benefits will need 2 most recent paystubs, if you get paid weekly, Benefits will need 4 most recent paystubs. Email: [email or web address] text number: [phone], benefits fax: [phone]      Provider today:  Varney Daily, FNP-BC      ID CLINIC address:   [facility] at [facility]  [address]  [place], [place] [address]    Contact information:    The ID clinic phone number is [phone]   The ID clinic fax number is [phone]  For urgent issues on nights and weekends: Call the ID Physician on-call through the [facility] Operator at [phone].    Please sign up for My [place] Chart - This is a great way to review your labs and track your appointments    Please try to arrive 30 minutes BEFORE your scheduled appointment time!  This will give you time to fill out any front desk paperwork needed for your visit, and allow you to be seen as close to your scheduled appointment time as possible.

## 2021-04-24 NOTE — Unmapped (Signed)
Patient is need of completing RW application due to patient being out of meds. Was unable to meet with patient in clinic. Scheduled phone appt for RW on 04/27/2021 for 11:30am. Was able to get patient temp 30 day PAP.    Madinah Cathcart-Rowe  Benefits Counselor  Time of Intervention: 15 mins

## 2021-04-24 NOTE — Unmapped (Addendum)
[facility] Refill Coordination Note    Specialty Medication(s) to be Shipped:   Infectious Disease: Odefsey    Other medication(s) to be shipped: No additional medications requested for fill at this time     Karen Strickland, DOB: [birth year]  Phone: [phone] (home)       All above HIPAA information was verified with patient.     Was a Nurse, learning disability used for this call? No    Completed refill call assessment today to schedule patient's medication shipment from the [facility] [phone]).  All relevant notes have been reviewed.     Specialty medication(s) and dose(s) confirmed: Regimen is correct and unchanged.   Changes to medications: Karen Strickland reports no changes at this time.  Changes to insurance: No  New side effects reported not previously addressed with a pharmacist or physician: None reported  Questions for the pharmacist: No    Confirmed patient received a Conservation officer, historic buildings and a Surveyor, mining with first shipment. The patient will receive a drug information handout for each medication shipped and additional FDA Medication Guides as required.       DISEASE/MEDICATION-SPECIFIC INFORMATION        N/A    SPECIALTY MEDICATION ADHERENCE     Medication Adherence    Patient reported X missed doses in the last month: >5  Specialty Medication: emtricitab-rilpivir-tenofo ala (ODEFSEY) 200-25-25 mg Tab  Patient is on additional specialty medications: No  Informant: patient  Reasons for non-adherence: patient has problems affording medications           ODEFSEY  0 days worth of medication on hand.        Were doses missed due to medication being on hold? No        REFERRAL TO PHARMACIST     Referral to the pharmacist: Not needed      [facility]     Shipping address confirmed in Epic.     Delivery Scheduled: Yes, Expected medication delivery date: 04/28/21.     Medication will be delivered via UPS to the prescription address in Epic W[facility].    Karen Strickland   [facility] Specialty Technician

## 2021-04-24 NOTE — Unmapped (Addendum)
It was a pleasure seeing you in clinic today. We have made the following plan.     --We will collect blood work and get back to you with the results.   --You will get the tetanus booster today   --We refilled you Maxzide and Effexor     Otherwise, we look forward to seeing you in clinic at your next appointment!

## 2021-04-24 NOTE — Unmapped (Unsigned)
INFECTIOUS DISEASES CLINIC  [address]  [place], [place]  [address]  P [phone]  F [phone]     Primary care provider: Mack Guise, MD    Patient had been scheduled to be seen in ID clinic after PCP visit, however due to time crunch with PCP appointment and picking up medications, patient agreed to have video visit on 04/27/21 instead. Labs ordered and collected at outpatient lab. No visit was performed today. patient on Amlodipine 2.5mg  daily, Maxzide 37.5-25 (0.5 tablet daily)      Nicotine dependence  - Never smoker      Sexual health & secondary prevention  - abstinent at this time  Not in relationship.  LSE 05/2020  Parts of body used during sex include: mouth and vagina. Does not have anal sex. Gives and receives oral sex. Receptive partner for vaginal sex.   In past 6 months has had no sex and has not had add'l STI screening.  She uses condoms  She does routinely discuss HIV status with partner(s).  Have not discussed interest in having children.    No results found for: RPR, CTNAA, GCNAA, SPECSOURCE    ??? GC/CT NAATs - patient declined screening  ??? RPR - 07/29/20 nonreactive (CareEverywhere)      Health maintenance  - chronic, stable  PCP: Dr. Prescott Gum, has appointment today    Oral health  She does  have a dentist. Last dental exam 09/2020.    Eye health  She does  use corrective lenses. Last eye exam 08/2020. Early glaucoma    Metabolic conditions  Wt Readings from Last 5 Encounters:   04/24/21 (!) 111.9 kg (246 lb 9.6 oz)   02/11/21 (!) 111.8 kg (246 lb 6.4 oz)     Lab Results   Component Value Date    CREATININE 0.86 (H) 02/11/2021    GLU 104 02/11/2021    ALT 23 02/11/2021     # Kidney health - creatinine today  # Bone health - FRAX score major osteoporotic 2.1%/Hip fracture 0.1%% on this date: 02/13/21  # Diabetes assessment - random glucose today  # NAFLD assessment - monitor over time    Communicable diseases  No results found for: QFTTBGOLD, QTBG, HEPAIGG, HEPBSAG, HEPBSAB, HEPCAB, HCVRNA, HCVRNAIU, HCVR, HCVIU, RUBEOL, MUMPSIGG, RUBIG, VZVIGG  # TB screening - no longer needed; negative IGRA, low risk 01/04/2019  # Hepatitis screening - as noted: (07/21/20) HepB sAg NR, HepC Ab NR, needs full Hep B labs  # MMR screening - 01/04/19 Measles immune    Cancer screening  No results found for: PSASCRN, PSA, PAP, FINALDX  # Cervical - Managed by GYN  # Breast - Managed by GYN    # Anorectal - Needs anal pap  # Colorectal - Defer to PCP  # Liver - no screening indicated  # Lung - screening not indicated    Cardiovascular disease  No results found for: CHOL, HDL, LDL, NONHDL, TRIG  # The 10-year ASCVD risk score Denman George DC Jr., et al., 2013) is: 2.1%  - is not taking aspirin   - is not taking statin  - BP control fair  - never smoker  # AAA screening - no indication for screening    Immunization History   Administered Date(s) Administered   ??? COVID-19 VACC,IM,MRNA(BOOSTER)OR(6-83YR)(MODERNA) 02/11/2021   ??? COVID-19 VACCINE,MRNA(MODERNA)(PF)(IM) 02/13/2020, 03/12/2020   ??? DTaP, Unspecified Formulation 03/09/1966, 04/08/1966, 05/10/1966, 05/17/1968   ??? Hepatitis B vaccine, pediatric/adolescent dosage, 05/17/2003, 06/18/2003, 11/22/2003   ???  INFLUENZA TIV (TRI) 35MO+ W/ PRESERV (IM) 09/27/2011, 06/07/2012, 07/20/2012, 06/08/2013, 06/03/2015, 08/06/2019   ??? Influenza Vaccine Quad (IIV4 W/PRESERV) 35MO+ 09/27/2011, 06/07/2012, 06/08/2013, 06/03/2015, 08/06/2019   ??? Influenza Virus Vaccine, unspecified formulation 09/27/2011, 06/07/2012, 07/20/2012, 06/08/2013, 06/03/2015   ??? Measles 04/12/1969     ??? Immunizations today - COVID booster  ??? Pneumovax 23      Counseling services took more than 50% of today's visit.  I personally spent 45 minutes face-to-face and non-face-to-face in the care of this patient, which includes all pre, intra, and post visit time on the date of service.      Disposition  Next appointment: 4 weeks      To do @ next RTC  ??? Hep B sAb, Hep B cAb  ??? Anal pap  ??? Prevnar 13  ??? UA      Varney Daily, FNP-BC  [facility] at [facility]  [address], [place], [place] [address]    Phone: [phone]   Fax: [phone]           Subjective      Chief Complaint   Establish HIV care, known HIV diagnosis    HPI  In addition to details in A&P above:  ?? Has had 3 month interruption of ART. Has been paying $120 a month for meds. Finances have been difficult.  ?? She would like to change jobs. Has had an interview today.  ?? Tongue hurts on left side with certain movement x 3 days. On exam, no lesions, no decreased movement.  ?? Has had recent discontent with her care at [facility]. She would like to transfer all her care to [facility]. She is willing to drive to [facility] from [place] where she lives. She does like her GYN quite a bit and will continue follow up with GYN.  Denies any fever, chills, nausea, vomiting, rash, urinary complaints, diarrhea, constipation.        Past Medical History:   Diagnosis Date   ??? Other specified glaucoma    ??? Urinary tract infection        Social History  Housing - in apartment with roommate  School / Work & Benefits - employed ([employer]), hoping to change jobs.    Social History     Tobacco Use   ??? Smoking status: Never Smoker   ??? Smokeless tobacco: Never Used   Substance Use Topics   ??? Drug use: Never       Review of Systems  As per HPI. All others negative.      Medications and Allergies  She has a current medication list which includes the following prescription(s): amlodipine, odefsey, meloxicam, oxybutynin, triamterene-hydrochlorothiazide, triamterene-hydrochlorothiazide, valacyclovir, and venlafaxine.    Allergies: Patient has no known allergies.      Family History  Her family history includes Asthma in her maternal aunt and maternal grandmother; COPD in her mother; Cancer in her maternal aunt, maternal grandmother, and son; Depression in her mother; Diabetes in her brother and father; Heart disease in her maternal aunt and mother; Hypertension in her brother, father, and mother; Kidney disease in her paternal aunt and paternal uncle.           Objective      There were no vitals taken for this visit.   Vitals - 1 value per visit 04/24/2021   BP 123/81   Pulse 75   Temp 96.4   Weight (lb) 246 lbs 10 oz   Weight (kg) 111.857 kg  Height IN (Length)    Height (CM) (Length)    BMI (Calculated)    BSA (Calculated - sq m)    VISIT REPORT      Const looks well and attentive, alert, appropriate Eyes sclerae anicteric, noninjected OU   ENT no thrush, leukoplakia or oral lesions and no lesions to tongue, no deficits in movement. Very mild discomfort when pushing tongue to right side.   Lymph bilateral anterior cervical posterior cervical supraclavicular NO LAD   CV RRR. No murmurs. No rub or gallop. S1/S2.   Resp CTAB ant/post, normal work of breathing   GI Soft. NTND. NABS.   GU deferred   Rectal deferred   Skin no petechiae, ecchymoses or obvious rashes on clothed exam   MSK no joint tenderness and normal ROM throughout   Neuro grossly intact   Psych Appropriate affect. Eye contact good. Linear thoughts. Fluent speech.

## 2021-04-24 NOTE — Unmapped (Unsigned)
INFECTIOUS DISEASES CLINIC  [address]  [place], [place]  [address]  P [phone]  F [phone]     Primary care provider: Mariana Single, FNP    Assessment/Plan:      HIV (dx'd 2004)  - chronic, stable  Health Department came to her home and told she needed testing.  Participated in study that started her on Edurant(RPV) and Truvada(FTC/TDF)-equivalent to Complera  Has been stable on current regimen.  Open to starting one-pill regimen.  Patient's insurance did not cover Complera. Switched to [facility] which is equivalent, but has TAF rather than TDF.     Overall doing well. Current regimen: Odefsey (FTC/RPV/TAF) and Truvada (FTC/TDF)  Misses doses of ARVs rarely missed some doses due to lapse in funding    Med access through insurance  CD4 count 702/54%  Discussed ARV adherence and taking ARVs with food    Lab Results   Component Value Date    ACD4 702 02/11/2021    CD4 54 02/11/2021    HIVRS Detected (A) 02/11/2021    HIVCP 65,626 (H) 02/11/2021     ??? CD4, HIV RNA, and safety labs (full return panel)  ??? Continue current therapy.   ??? Discussed importance of ARV adherence  ?? She is hoping to switch jobs soon and her insurance will change at that time      Depression  - acute, exacerbated  Started therapy  Patient has had two traumatic losses in her life (two family members killed within the same week) and has had financial difficulties that has been quite stressful  ??? Patient will meet with social work today to see how our team can best support her.  ??? Became quite depressed in August 2021 at the time of the loss of her family members and not being able to access her medications. She is stable at this time but still has low mood.      History of CoVid infection (02/2019) - acute, self-limited  Symptoms included: Fever, body aches. Denies loss of taste or smell, coughing, GI complaints  Symptoms resolved.  ?? Provide CoVid booster today (Moderna)      Hypertension  - chronic  133/92 today  ??? Per med list, patient on Amlodipine 2.5mg  daily, Maxzide-25 (0.5 tablet daily)      Nicotine dependence  - Never smoker      Sexual health & secondary prevention  - abstinent at this time  Not in relationship.  LSE 05/2020  Parts of body used during sex include: mouth and vagina. Does not have anal sex. Gives and receives oral sex. Receptive partner for vaginal sex.   In past 6 months has had no sex and has not had add'l STI screening.  She uses condoms  She does routinely discuss HIV status with partner(s).  Have not discussed interest in having children.    No results found for: RPR, CTNAA, GCNAA, SPECSOURCE    ??? GC/CT NAATs - patient declined screening  ??? RPR - 07/29/20 nonreactive (CareEverywhere)      Health maintenance  - chronic, stable  PCP: referred to Internal Medicine, has appointment today    Oral health  She does  have a dentist. Last dental exam 09/2020.    Eye health  She does  use corrective lenses. Last eye exam 08/2020. Early glaucoma    Metabolic conditions  Wt Readings from Last 5 Encounters:   02/11/21 (!) 111.8 kg (246 lb 6.4 oz)     Lab  Results   Component Value Date    CREATININE 0.86 (H) 02/11/2021    GLU 104 02/11/2021    ALT 23 02/11/2021     # Kidney health - creatinine today  # Bone health - FRAX score major osteoporotic 2.1%/Hip fracture 0.1%% on this date: 02/13/21  # Diabetes assessment - random glucose today  # NAFLD assessment - monitor over time    Communicable diseases  No results found for: QFTTBGOLD, QTBG, HEPAIGG, HEPBSAG, HEPBSAB, HEPCAB, HCVRNA, HCVRNAIU, HCVR, HCVIU, RUBEOL, MUMPSIGG, RUBIG, VZVIGG  # TB screening - no longer needed; negative IGRA, low risk 01/04/2019  # Hepatitis screening - as noted: (07/21/20) HepB sAg NR, HepC Ab NR, needs full Hep B labs  # MMR screening - 01/04/19 Measles immune    Cancer screening  No results found for: PSASCRN, PSA, PAP, FINALDX  # Cervical - Managed by GYN  # Breast - Managed by GYN    # Anorectal - Needs anal pap  # Colorectal - Defer to PCP  # Liver - no screening indicated  # Lung - screening not indicated    Cardiovascular disease  No results found for: CHOL, HDL, LDL, NONHDL, TRIG  # The 10-year ASCVD risk score Denman George DC Jr., et al., 2013) is: 2.5%  - is not taking aspirin   - is not taking statin  - BP control fair  - never smoker  # AAA screening - no indication for screening    Immunization History   Administered Date(s) Administered   ??? COVID-19 VACCINE, MRNA(MODERNA)(BOOSTER)(PF) 02/11/2021   ??? COVID-19 VACCINE,MRNA(MODERNA)(PF)(IM) 02/13/2020, 03/12/2020   ??? DTaP, Unspecified Formulation 03/09/1966, 04/08/1966, 05/10/1966, 05/17/1968   ??? Hepatitis B vaccine, pediatric/adolescent dosage, 05/17/2003, 06/18/2003, 11/22/2003   ??? INFLUENZA TIV (TRI) 81MO+ W/ PRESERV (IM) 09/27/2011, 06/07/2012, 07/20/2012, 06/08/2013, 06/03/2015, 08/06/2019   ??? Influenza Vaccine Quad (IIV4 W/PRESERV) 81MO+ 09/27/2011, 06/07/2012, 06/08/2013, 06/03/2015, 08/06/2019   ??? Influenza Virus Vaccine, unspecified formulation 09/27/2011, 06/07/2012, 07/20/2012, 06/08/2013, 06/03/2015   ??? Measles 04/12/1969     ??? Immunizations today - COVID booster  ??? Pneumovax 23      Counseling services took more than 50% of today's visit.  I personally spent 45 minutes face-to-face and non-face-to-face in the care of this patient, which includes all pre, intra, and post visit time on the date of service.      Disposition  Next appointment: 4 weeks      To do @ next RTC  ??? Hep B sAb, Hep B cAb  ??? Anal pap  ??? Prevnar 13  ??? UA      Varney Daily, FNP-BC  [facility] Infectious Diseases Clinic at [facility]  [address], [place], [place] [address]    Phone: [phone]   Fax: [phone]           Subjective      Chief Complaint   Establish HIV care, known HIV diagnosis    HPI  In addition to details in A&P above:  ?? Has had 3 month interruption of ART. Has been paying $120 a month for meds. Finances have been difficult.  ?? She would like to change jobs. Has had an interview today.  ?? Tongue hurts on left side with certain movement x 3 days. On exam, no lesions, no decreased movement.  ?? Has had recent discontent with her care at [facility] She would like to transfer all her care to [facility]. She is willing to drive to [facility] from [place] where she lives. She does  like her GYN quite a bit and will continue follow up with GYN.  Denies any fever, chills, nausea, vomiting, rash, urinary complaints, diarrhea, constipation.        Past Medical History:   Diagnosis Date   ??? Other specified glaucoma    ??? Urinary tract infection        Social History  Housing - in apartment with roommate  School / Work & Benefits - employed ([employer]), hoping to change jobs.    Social History     Tobacco Use   ??? Smoking status: Never Smoker   ??? Smokeless tobacco: Never Used   Substance Use Topics   ??? Drug use: Never       Review of Systems  As per HPI. All others negative.      Medications and Allergies  She has a current medication list which includes the following prescription(s): amlodipine, odefsey, meloxicam, oxybutynin, triamterene-hydrochlorothiazide, triamterene-hydrochlorothiazide, valacyclovir, and venlafaxine.    Allergies: Patient has no known allergies.      Family History  Her family history includes Asthma in her maternal aunt and maternal grandmother; COPD in her mother; Cancer in her maternal aunt, maternal grandmother, and son; Depression in her mother; Diabetes in her brother and father; Heart disease in her maternal aunt and mother; Hypertension in her brother, father, and mother; Kidney disease in her paternal aunt and paternal uncle.           Objective      There were no vitals taken for this visit.     Const looks well and attentive, alert, appropriate   Eyes sclerae anicteric, noninjected OU   ENT no thrush, leukoplakia or oral lesions and no lesions to tongue, no deficits in movement. Very mild discomfort when pushing tongue to right side.   Lymph bilateral anterior cervical posterior cervical supraclavicular NO LAD   CV RRR. No murmurs. No rub or gallop. S1/S2.   Resp CTAB ant/post, normal work of breathing   GI Soft. NTND. NABS.   GU deferred   Rectal deferred   Skin no petechiae, ecchymoses or obvious rashes on clothed exam   MSK no joint tenderness and normal ROM throughout   Neuro grossly intact   Psych Appropriate affect. Eye contact good. Linear thoughts. Fluent speech.

## 2021-04-24 NOTE — Unmapped (Cosign Needed)
[facility]     Type of visit:  face to face    Reason for visit: est care     Questions / Concerns that need to be addressed: Medications  Sciatic nerve   Depression  Anxiety  A1c question    General Consent to Treat (GCT) for non-epic video visits only: Verbal consent    Diabetes:  ??? Regularly checking blood sugars?: no  o If yes, when? Complete log for past 7 days  Date Before Breakfast After Breakfast Before Lunch After Lunch Before Dinner After Dinner Before Bed                                                                                                                                   Hypertension:  ??? Have blood pressure cuff at home?: no  ??? Regularly checking blood pressure?: no  If yes, complete log for past 7 days  Date Time BP Pulse                                                                           Screening BP-139/80  70    Omron BPs (complete if screening BP has a systolic  > 139 or diastolic > 89)  BP#1 127/85  73  BP#2  125/79  77  BP#3   117/80  75    Average BP 123/81  75  (please note this as a comment in vitals)     Allergies reviewed: Yes    Medication reviewed: Yes  Pended refills? Yes    HCDM reviewed and updated in Epic:    We are working to make sure all of our patients??? wishes are updated in Epic and part of that is documenting a Environmental health practitioner for each patient  A Health Care Decision Maker is someone you choose who can make health care decisions for you if you are not able - who would you most want to do this for you????  is already up to date.    HCDM (patient stated preference): Christalyn Goertz,Blanche - [phone]      BPAs completed:  PHQ2  PHQ9  GAD7  AUDIT - Alcohol Screen  HARK - Interpersonal Violence      COVID-19 Vaccine Summary  Which COVID-19 Vaccine was administered  Moderna  Type:  Dates Given:  02/11/2021            Immunization History   Administered Date(s) Administered   ??? COVID-19 VACC,IM,MRNA(BOOSTER)OR(6-59YR)(MODERNA) 02/11/2021   ??? COVID-19 VACCINE,MRNA(MODERNA)(PF)(IM) 02/13/2020, 03/12/2020   ??? DTaP, Unspecified Formulation 03/09/1966, 04/08/1966, 05/10/1966, 05/17/1968   ??? Hepatitis B vaccine, pediatric/adolescent dosage,  05/17/2003, 06/18/2003, 11/22/2003   ??? INFLUENZA TIV (TRI) 855MO+ W/ PRESERV (IM) 09/27/2011, 06/07/2012, 07/20/2012, 06/08/2013, 06/03/2015, 08/06/2019   ??? Influenza Vaccine Quad (IIV4 W/PRESERV) 855MO+ 09/27/2011, 06/07/2012, 06/08/2013, 06/03/2015, 08/06/2019   ??? Influenza Virus Vaccine, unspecified formulation 09/27/2011, 06/07/2012, 07/20/2012, 06/08/2013, 06/03/2015   ??? Measles 04/12/1969       __________________________________________________________________________________________    SCREENINGS COMPLETED IN FLOWSHEETS    HARK Screening  HARK Screening  Within the last year, have you been humiliated or emotionally abused in other ways by your partner or ex-partner?: No  Within the last year, have you been afraid of your partner or ex-partner?: No  Within the last year, have you been raped or forced to have any kind of sexual activity by your partner or ex-partner?: No  Within the last year, have you been kicked, hit, slapped, or otherwise physically hurt by your partner or ex-partner?: No      PHQ2  PHQ-2 Total Score : 2    PHQ9  Thoughts that you would be better off dead, or of hurting yourself in some way: Not at all  PHQ-9 TOTAL SCORE: 6    GAD7    Over the last 2 weeks, how often have you been bothered by the following problems?  Feeling nervous, anxious or on edge: Nearly every day  Not being able to stop or control worrying: Nearly every day  Worrying too much about different things: Nearly every day  Trouble relaxing: Nearly every day  Being so restless that it is hard to sit still: Not at all  Becoming easily annoyed or irritable: Nearly every day  Feeling afraid as if something awful might happen: Nearly every day  GAD-7 Total Score: 18  How difficult have these problems made it for you to do your work, take care of things at home, or get along with other people?: Extremely difficult

## 2021-04-25 LAB — LYMPH MARKER LIMITED,FLOW
ABSOLUTE CD3 CNT: 1476 {cells}/uL (ref 915–3400)
ABSOLUTE CD4 CNT: 1008 {cells}/uL (ref 510–2320)
ABSOLUTE CD8 CNT: 450 {cells}/uL (ref 180–1520)
CD3% (T CELLS): 82 % (ref 61–86)
CD4% (T HELPER): 56 % (ref 34–58)
CD4:CD8 RATIO: 2.2 (ref 0.9–4.8)
CD8% T SUPPRESR: 25 % (ref 12–38)

## 2021-04-25 NOTE — Unmapped (Signed)
I saw and evaluated the patient, participating in the key portions of the service.  I reviewed the resident’s note.  I agree with the resident’s findings and plan. Akyra Bouchie R Doris Gruhn, MD

## 2021-04-25 NOTE — Unmapped (Signed)
Addended by: Lucita Ferrara R on: 04/25/2021 11:55 AM     Modules accepted: Level of Service

## 2021-04-26 LAB — SYPHILIS SCREEN: SYPHILIS RPR SCREEN: NONREACTIVE

## 2021-04-27 ENCOUNTER — Ambulatory Visit: Admit: 2021-04-27 | Discharge: 2021-04-28 | Payer: PRIVATE HEALTH INSURANCE

## 2021-04-27 DIAGNOSIS — Z113 Encounter for screening for infections with a predominantly sexual mode of transmission: Principal | ICD-10-CM

## 2021-04-27 DIAGNOSIS — B20 Human immunodeficiency virus [HIV] disease: Principal | ICD-10-CM

## 2021-04-27 DIAGNOSIS — Z0000 Encounter for general adult medical examination without abnormal findings: Principal | ICD-10-CM

## 2021-04-27 LAB — HEPATITIS A IGG: HEPATITIS A IGG: REACTIVE — AB

## 2021-04-27 LAB — HEPATITIS B SURFACE ANTIBODY
HEPATITIS B SURFACE ANTIBODY QUANT: 843.32 m[IU]/mL — ABNORMAL HIGH (ref <8.00)
HEPATITIS B SURFACE ANTIBODY: REACTIVE — AB

## 2021-04-27 LAB — HEPATITIS B CORE ANTIBODY, TOTAL: HEPATITIS B CORE TOTAL ANTIBODY: NONREACTIVE

## 2021-04-27 MED ORDER — ODEFSEY 200 MG-25 MG-25 MG TABLET
ORAL_TABLET | Freq: Every day | ORAL | 5 refills | 30.00000 days | Status: CP
Start: 2021-04-27 — End: ?

## 2021-04-27 NOTE — Unmapped (Signed)
Name: KYRIANNA BARLETTA  Date: 04/27/2021  Address: [address]  [place] [address]   Phone: [phone]     Started assessment with patient options: over the phone     Is this the same address for mailing? 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  Not Applicable    List Tax Household Members including relationship to you:   N/A    Someone in my household receives: Not Applicable (for household members)  Specify who: N/A    Do you have a current diagnosis for Hepatitis C?  No results found for: HEPCAB, HCVR, HCVRNA, HCVIU, HCVRNAIU, HCVGENOTYPE    Have you used tobacco products four or more times per week in the last six months?  No    Patient was informed of the following programs;   N/A    The following applications/handouts were given to patient:   N/A    The following forms were also started with the patient:   N/A    Federal Marketplace Eligibility Assessment  Patients is not eligible for marketplace insurance due to open enrollment.     [employer] application status: Incomplete; patient needs to send 2 most recent paystubs    Patient is applying for HMAP - UMAP (uninsured - medication assistance)    Additional Comments: N/A    Madinah Cathcart-Rowe  Benefits Counselor  Time of Intervention: 15 mins

## 2021-04-27 NOTE — Unmapped (Signed)
[facility]  [address]  [place], [place]  [address]  P [phone]  F [phone]     Primary care provider: Mack Guise, MD        The patient reports they are currently: not at home. I spent 32 minutes on the phone with the patient on the date of service. I spent an additional 5 minutes on pre- and post-visit activities on the date of service.     The patient was physically located in [place] or a state in which I am permitted to provide care. The patient and/or parent/guardian understood that s/he may incur co-pays and cost sharing, and agreed to the telemedicine visit. The visit was reasonable and appropriate under the circumstances given the patient's presentation at the time.    The patient and/or parent/guardian has been advised of the potential risks and limitations of this mode of treatment (including, but not limited to, the absence of in-person examination) and has agreed to be treated using telemedicine. The patient's/patient's family's questions regarding telemedicine have been answered.     If the visit was completed in an ambulatory setting, the patient and/or parent/guardian has also been advised to contact their provider???s office for worsening conditions, and seek emergency medical treatment and/or call 911 if the patient deems either necessary.    Unable to have video visit as patient was driving.    Assessment/Plan:      HIV (dx'd 2004)  - chronic, stable  Health Department came to her home and told she needed testing.  Participated in study that started her on Edurant(RPV) and Truvada(FTC/TDF)-equivalent to Complera  Started on Odefsy which is covered on current insurance plan in 01/2021. (Patient's insurance did not cover for Complera. Odefsy is equivalent, but has TAF rather than TDF. Patient amenable to starting this.)    Overall doing well. Current regimen: Odefsey (FTC/RPV/TAF)- has missed a few days of doses due to insurance change  Misses doses of ARVs never if she has access to meds    Med access through insurance  CD4 count to be determined  Discussed ARV adherence and taking ARVs with food    Lab Results   Component Value Date    ACD4 1,008 04/24/2021    CD4 56 04/24/2021    HIVRS Detected (A) 02/11/2021    HIVCP 65,626 (H) 02/11/2021     ??? CD4, HIV RNA, and safety labs (full return panel)  ??? Continue current therapy.   ??? Discussed importance of ARV adherence  ?? Very interested in [place]. Discussed benefits/risks of treatment with injectable modality. Patient would really like to start this. Could also have future Cabenuva injections at local [facility] clinic.  ?? However, patient was detectable at last visit, repeated labs on 04/24/21, viral load still pending.       Depression  - acute, exacerbated  Started therapy  Patient has had two traumatic losses in her life (two family members killed within the same week) and has had financial difficulties that has been quite stressful  ??? Became quite depressed in August 2021 at the time of the loss of her family members and not being able to access her medications. She is stable at this time but still has low mood.  ??? Started Effexor, prescribed by PCP, has been helpful      BMI 37 - chronic  ?? Would like to lose weight, she had been able to lose weight previously but weight came back after all the  happenings this past year.  ?? 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      History of CoVid infection (02/2019) - acute, self-limited  Symptoms included: Fever, body aches. Denies loss of taste or smell, coughing, GI complaints  Symptoms resolved.  ?? Needs CoVid booster #2 (4th dose) due in 06/14/21- preferably Pfizer, will administer at next visit.       Hypertension  - chronic, improving  123/81 on visit 04/24/21  ??? Continue Triamterene/HCTZ 37.5/25mg  daily.      Nicotine dependence  - Never smoker      Monkeypox Education  ??? Discussed current outbreak, risk factors, lesion recognition, and clinical signs/symptoms including fever, headache, myalgias, and fatigue followed a few days later by a rash. Discussed varied appearance of rash including stages of evolution.   ??? Shared relevant information and vaccine access. Discussed that monkeypox vaccines are free and are based on availability of vaccine at local health depts and at our [facility] clinic.  ??? Will consider MPX vaccination      Sexual health & secondary prevention  - chronic, stable  Not in relationship.  LSE 2 weeks ago about end of July 2022  Parts of body used during sex include: mouth and vagina. Does not have anal sex. Gives and receives oral sex. Receptive partner for vaginal sex.   In past 6 months has had no sex and has not had add'l STI screening.  She uses condoms  She does routinely discuss HIV status with partner(s).  Have not discussed interest in having children.    Lab Results   Component Value Date    RPR Nonreactive 04/24/2021     ??? GC/CT NAATs - Ordered but patient did not leave samples with outpatient lab.  ??? RPR - 04/24/21 RPR NR      Health maintenance  - chronic, stable  PCP: Dr. Prescott Gum with Internal Medicine  Patient would like PCP [facility].    Oral health  She does  have a dentist. Last dental exam 09/2020.    Eye health  She does  use corrective lenses. Last eye exam 08/2020. Early glaucoma    Metabolic conditions  Wt Readings from Last 5 Encounters:   04/24/21 (!) 111.9 kg (246 lb 9.6 oz)   02/11/21 (!) 111.8 kg (246 lb 6.4 oz)     Lab Results   Component Value Date    CREATININE 1.01 (H) 04/24/2021    GLUCOSEU Negative 04/24/2021    GLU 106 (H) 04/24/2021    A1C 6.0 (H) 04/24/2021    ALT 27 04/24/2021    ALT 23 02/11/2021     # Kidney health - creatinine today  # Bone health - FRAX score major osteoporotic 2.1%/Hip fracture 0.1%% on this date: 02/13/21  # Diabetes assessment - random glucose today  # NAFLD assessment - monitor over time    Communicable diseases  No results found for: QFTTBGOLD, QTBG, HEPAIGG, HEPBSAG, HEPBSAB, HEPCAB, HCVRNA, HCVRNAIU, HCVR, HCVIU, RUBEOL, MUMPSIGG, RUBIG, VZVIGG  # TB screening - no longer needed; negative IGRA, low risk 01/04/2019  # Hepatitis screening - as noted: (07/21/20) HepB sAg NR, HepC Ab NR, (04/24/21) HepB sAb REACTIVE, HepB cAb NR, HepA REACTIVE  # MMR screening - 01/04/19 Measles immune    Cancer screening  No results found for: PSASCRN, PSA, PAP, FINALDX  # Cervical - Managed by GYN  # Breast - Managed by GYN    # Anorectal -  Needs anal pap  # Colorectal - Defer to PCP  # Liver - no screening indicated  # Lung - screening not indicated    Cardiovascular disease  Lab Results   Component Value Date    CHOL 141 04/24/2021    HDL 54 04/24/2021    LDL 61 04/24/2021    NONHDL 87 04/24/2021    TRIG 409 04/24/2021     # The 10-year ASCVD risk score Denman George DC Jr., et al., 2013) is: 2.2%  - is not taking aspirin   - is not taking statin  - BP control fair  - never smoker  # AAA screening - no indication for screening    Immunization History   Administered Date(s) Administered   ??? COVID-19 VACC,IM,MRNA(BOOSTER)OR(6-33YR)(MODERNA) 02/11/2021   ??? COVID-19 VACCINE,MRNA(MODERNA)(PF)(IM) 02/13/2020, 03/12/2020   ??? DTaP, Unspecified Formulation 03/09/1966, 04/08/1966, 05/10/1966, 05/17/1968   ??? Hepatitis B vaccine, pediatric/adolescent dosage, 05/17/2003, 06/18/2003, 11/22/2003   ??? INFLUENZA TIV (TRI) 55MO+ W/ PRESERV (IM) 09/27/2011, 06/07/2012, 07/20/2012, 06/08/2013, 06/03/2015, 08/06/2019   ??? Influenza Vaccine Quad (IIV4 W/PRESERV) 55MO+ 09/27/2011, 06/07/2012, 06/08/2013, 06/03/2015, 08/06/2019   ??? Influenza Virus Vaccine, unspecified formulation 09/27/2011, 06/07/2012, 07/20/2012, 06/08/2013, 06/03/2015   ??? Measles 04/12/1969     ??? Immunizations today - None today    Counseling services took more than 50% of today's visit.      Disposition  Next appointment: 4 weeks      To do @ next RTC  ??? Anal pap  ??? PCV -20  ??? CoVid booster      Varney Daily, FNP-BC  [facility] at [facility]  [address] [place], [place] [address]    Phone: [phone]   Fax: [phone]           Subjective      Chief Complaint   Establish HIV care, known HIV diagnosis    HPI  In addition to details in A&P above:  Denies any fever, chills, nausea, vomiting, rash, urinary complaints, diarrhea, constipation.      Past Medical History:   Diagnosis Date   ??? Other specified glaucoma    ??? Urinary tract infection        Social History  Housing - in apartment with roommate  School / Work & Benefits - employed ([employer]), hoping to change jobs.    Social History     Tobacco Use   ??? Smoking status: Never Smoker   ??? Smokeless tobacco: Never Used   Substance Use Topics   ??? Drug use: Never       Review of Systems  As per HPI. All others negative.      Medications and Allergies  She has a current medication list which includes the following prescription(s): amlodipine, odefsey, meloxicam, oxybutynin, triamterene-hydrochlorothiazide, valacyclovir, and venlafaxine.    Allergies: Patient has no known allergies.      Family History  Her family history includes Asthma in her maternal aunt and maternal grandmother; COPD in her mother; Cancer in her maternal aunt, maternal grandmother, and son; Depression in her mother; Diabetes in her brother and father; Heart disease in her maternal aunt and mother; Hypertension in her brother, father, and mother; Kidney disease in her paternal aunt and paternal uncle.           Objective      There were no vitals taken for this visit.   Vitals - 1 value per visit 04/24/2021   BP 123/81   Pulse 75   Temp  96.4   Weight (lb) 246 lbs 10 oz   Weight (kg) 111.857 kg   Height IN (Length)    Height (CM) (Length)    BMI (Calculated)    BSA (Calculated - sq m)    VISIT REPORT      Const looks well and attentive, alert, appropriate   Psych Appropriate affect. Linear thoughts. Fluent speech.

## 2021-04-27 NOTE — Unmapped (Addendum)
Called patient in regards of completing RW only application per Dr. Lucita Lora request. Patient did not answer. Left voicemail to call Benefits at [phone] to complete application.     Update: patient went over with scheduled appointment with Dr. Lucita Lora. Was able to contact the patient 1 hour after scheduled phone visit with Benefits. Patient did confirm medication pick up from temp PAP that was approved for pt on Friday, 04/24/2021. Also informed pt the difference between HMAP and RW Only services. Pt did state she wanted to switch over and submit application as HMAP. Pt will email 2 needed paystubs later tonight.        Karen Strickland  Benefits Counselor  Time of Intervention: 15 mins

## 2021-04-27 NOTE — Unmapped (Signed)
This note was reviewed by Rhetta Mura, Benefits Coordinator at [facility].    Rhetta Mura  ID Clinic Benefits Counselor  Time of Intervention-38mins

## 2021-04-28 DIAGNOSIS — B20 Human immunodeficiency virus [HIV] disease: Principal | ICD-10-CM

## 2021-04-28 LAB — HIV RNA, QUANTITATIVE, PCR
HIV RNA LOG(10): 2.67 {Log_copies}/mL — ABNORMAL HIGH (ref <0.00)
HIV RNA QNT RSLT: DETECTED — AB
HIV RNA: 469 {copies}/mL — ABNORMAL HIGH (ref <0)

## 2021-04-28 MED ORDER — DESCOVY 200 MG-25 MG TABLET
ORAL_TABLET | Freq: Every day | ORAL | 3 refills | 30 days | Status: CP
Start: 2021-04-28 — End: ?
  Filled 2021-04-29: qty 30, 30d supply, fill #0

## 2021-04-28 MED ORDER — RILPIVIRINE HCL 25 MG TABLET
ORAL_TABLET | Freq: Every day | ORAL | 3 refills | 30 days | Status: CP
Start: 2021-04-28 — End: ?
  Filled 2021-04-29: qty 30, 30d supply, fill #0

## 2021-04-28 NOTE — Unmapped (Signed)
Due to no insurance coverage, we are unable to dispense Odefsey at this time. PAP does not cover Odefsey. Moving forward, [facility] will contact the clinic to discuss medication alternatives that are covered under PAP's formulary.

## 2021-04-28 NOTE — Unmapped (Signed)
This onboarding is for the following medications:   ?? Edurant (rilpivirine) 25mg   ?? Descovy (emtricitabine-tenofovir alafenamide) 200-25mg     [facility]   Patient Onboarding/Medication Counseling    Karen Strickland is a 55 y.o. female with HIV who I am counseling today on initiation of therapy.  I am speaking to the patient.    Was a Nurse, learning disability used for this call? No    Verified patient's date of birth / HIPAA.    Specialty medication(s) to be sent: Infectious Disease: Descovy and Edurant      Non-specialty medications/supplies to be sent: n/a      Medications not needed at this time: n/a         Descovy (emtricitabine and tenofovir alafenamide)    The patient declined counseling on medication administration, missed dose instructions, goals of therapy, side effects and monitoring parameters, warnings and precautions, drug/food interactions and storage, handling precautions, and disposal because She has taken [facility] and is using Descovy plus rilpivirine to equal Odefsey. The information in the declined sections below are for informational purposes only and was not discussed with patient.       Medication & Administration     Dosage: Take 1 tablet by mouth daily    Administration: Take with or without food    Adherence/Missed dose instructions: take missed dose as soon as you remember. If it is close to the time of your next dose, skip the dose and resume with your next scheduled dose.    Goals of Therapy     Keep HIV levels non-detectable on lab tests    Side Effects & Monitoring Parameters   Common Side Effects:  ??? Upset stomach  ??? Diarrhea    The following side effects should be reported to the provider:  ?? Signs of an allergic reaction, like rash; hives; itching; red, swollen, blistered, or peeling skin with or without fever; wheezing; tightness in the chest or throat; trouble breathing, swallowing, or talking; unusual hoarseness; or swelling of the mouth, face, lips, tongue, or throat. ?? Signs of kidney problems like unable to pass urine, change in how much urine is passed, blood in the urine, or a big weight gain.  ?? Signs of liver problems like dark urine, feeling tired, not hungry, upset stomach or stomach pain, light-colored stools, throwing up, or yellow skin or eyes.   ?? Signs of too much lactic acid in the blood (lactic acidosis) like fast breathing, fast heartbeat, a heartbeat that does not feel normal, very bad upset stomach or throwing up, feeling very sleepy, shortness of breath, feeling very tired or weak, very bad dizziness, feeling cold, or muscle pain or cramps  ?? Weight gain    Monitoring Parameters:   - Serum creatinine  - Urine glucose  - Urine protein (prior to or when initiating therapy and as clinically indicated during therapy);  - Serum phosphorus (in patients with chronic kidney disease)  - Hepatic function tests  - Testing for hepatitis B virus (HBV) is recommended prior to or when initiating antiretroviral therapy.  - Patients with HIV and HBV coinfection should be monitored for several months following therapy discontinuation.  - CD4 count  - HIV RNA plasma levels     Contraindications, Warnings, & Precautions     ?? Signs and symptoms of immune reconstitution syndrome  ?? Signs and symptoms of lactic acidosis  ?? Hepatomegaly  ?? Steatosis  ?? Renal toxicity    Drug/Food Interactions     ???  Medication list reviewed in Epic. The patient was instructed to inform the care team before taking any new medications or supplements. No drug interactions identified.     Storage, Handling Precautions, & Disposal     ?? Store this medication at room temperature.   ?? Store in the original container   ?? Keep lid tightly closed.   ?? Store in a dry place. Do not store in a bathroom.   ?? Keep all drugs in a safe place. Keep all drugs out of the reach of children and pets.   ?? Throw away unused or expired drugs. Do not flush down a toilet or pour down a drain unless you are told to do so. Check with your pharmacist if you have questions about the best way to throw out drugs. There may be drug take-back programs in your area    Edurant (rilpivirine) 25mg  tablets    The patient declined counseling on medication administration, missed dose instructions, goals of therapy, side effects and monitoring parameters, warnings and precautions, drug/food interactions and storage, handling precautions, and disposal because has taken [facility] previously and she will be taking the individual rilpivirine plus Descovy to equal Odefsey.. The information in the declined sections below are for informational purposes only and was not discussed with patient.       Medication & Administration        Dosage: Take one tablet (25mg ) by mouth once daily     Administration:    ?? Take with a meal. Do not use a protein drink in place of this meal  ?? Do not take antacids within 2 hours before this drug or 4 hours after this drug.  ?? Famotidine must be taken 12 hours before or 4 hours after ripilvirine  ?? Do not take proton pump inhibitors such as omeprazole (Prilosec), esomeprazole (Nexium), pantoprazole (Protonix), lansoprazole (Prevacid), dexlansoprazole (Dexilant), rabeprazole (Aciphex), or omeprazole/sodium bicarbonate (Zegerid).    Adherence/Missed dose instructions:  ?? Take missed dose as soon as you remember.  ?? If it has been 12 hours or more since the missed dose, skip the dose and resume with your next scheduled dose.   ?? Do not take 2 doses at the same time.     Goals of Therapy      The goal is to suppress viral replication and keep the HIV virus undetectable by lab tests     Side Effects & Monitoring Parameters      ?? Headache.  ?? Trouble sleeping.  ?? Feeling dizzy or sleepy.  ?? Upset stomach or throwing up.  ?? Stomach pain.           The following side effects should be reported to the provider:  ? Signs of an allergic reaction, like rash; hives; itching; red, swollen, blistered, or peeling skin with or without fever;  ? If you have  wheezing; tightness in the chest or throat; trouble breathing, swallowing, or talking; unusual hoarseness; or swelling of the mouth, face, lips, tongue, or throat, call 911  ? Signs of depression, suicidal thoughts, emotional ups and downs, abnormal thinking, anxiety, or lack of interest in life.  ? A heartbeat that does not feel normal.  ? Change in body fat.  ? Muscle or joint pain.  ? Feeling very tired or weak.  ? Changes in your immune system can happen when you start taking drugs to treat HIV. If you have an infection that you did not know you had, it may show up  when you take this drug. Tell your doctor right away if you have any new signs after you start this drug, even after taking it for several months. This includes signs of infection like fever, sore throat, weakness, cough, or shortness of breath.  ? Very bad skin and allergic reactions have happened with this drug. Skin reactions have happened along with fever or problems in body organs like the liver. Call your doctor right away if you have red, swollen, blistered, or peeling skin (with or without fever); red or irritated eyes; sores in your mouth, throat, nose, or eyes; trouble swallowing; or   ? Signs of liver problems like dark urine, feeling tired, not hungry, upset stomach or stomach pain, light-colored stools, throwing up, or yellow skin or eyes.           Contraindications, Warnings, & Precautions      ?? Coadministration with anticonvulsants (carbamazepine, oxcarbazepine, phenobarbital, phenytoin), antimycobacterials (rifampin, rifapentine), proton pump inhibitors (esomeprazole, lansoprazole, omeprazole, pantoprazole, rabeprazole), systemic dexamethasone (more than a single dose), or St John's wort.   ?? QTc prolongation: Doses >25 mg daily have been associated with QTc prolongation; use caution when coadministering with a drug with a known risk of torsades de pointes   ?? Depressive disorders: May cause depression, depressed mood, dysphoria, major depression, mood changes, negative thoughts, suicide attempts, or suicidal ideation; if changes are noted, seek professional intervention immediately; reevaluate risk versus benefit of continued rilpivirine therapy  ??  Fat redistribution: May cause redistribution/accumulation of fat (eg, buffalo hump, central obesity, peripheral wasting, facial wasting, breast enlargement, cushingoid appearance).  ??  Hepatotoxicity: Has been reported during use. Patients with significant transaminase elevations or hepatitis B or C prior to treatment may be at greater risk for hepatic adverse events. Hepatotoxicity has occurred in a few adult patients with no prior hepatic disease or risk factors. Baseline and periodic laboratory LFT evaluation during therapy is recommended for patients with pre-existing risk factors; also consider LFT monitoring in patients without identifiable hepatic disease risk.  ??  Hypersensitivity: Hypersensitivity and severe skin reactions have been reported, including severe rash or rash accompanied by fever, blisters, mucosal involvement, conjunctivitis, facial edema, angioedema, hepatitis or eosinophilia, or drug reaction with eosinophilia and systemic symptoms. Some skin reactions were accompanied by constitutional symptoms (eg, fever); other skin reactions were associated with organ dysfunction (eg, hepatic serum biochemistry elevations).        Drug/Food Interactions      ??? Medication list reviewed in Epic. The patient was instructed to inform the careteam before taking any new medications or supplements.  ???  Drug interaction.   o Venlafaxine: Rilpivirine has been associated with prolongation of the QTc interval at supra-therapeutic doses but these are unlikely to occur during coadministration with venlafaxine. However, the product labels for rilpivirine indicate that rilpivirine should be used with caution in combination with drugs with a known risk of Torsade de Pointes.??Venlafaxine has a possible risks of QT prolongation and/or TdP ([place] HIV Drug Interaction checker: [place] HIV Interactions (hiv-druginteractions.org)  She has taken Odefsey (emtricitabine-rilpivirine-tenofovir alafenamide)  with venlafaxine with no adverse effects.  ??? Avoid grapefruit      Storage, Handling Precautions, & Disposal      ?? Store at room temperature in a dry place. Do not store in a bathroom.  ?? Store in the original container to protect from light  ?? Keep all drugs in a safe place. Keep all drugs out of the reach of children and pets.  ?? Throw  away unused or expired drugs. Do not flush down a toilet or pour down a drain unless you are told to do so. Check with your pharmacist if you have questions about the best way to throw out drugs. There may be drug take-back programs in your area.        Current Medications (including OTC/herbals), Comorbidities and Allergies     Current Outpatient Medications   Medication Sig Dispense Refill   ??? amLODIPine (NORVASC) 5 MG tablet Take 2.5 mg by mouth. (Patient not taking: Reported on 04/24/2021)     ??? emtricitabine-tenofovir alafen (DESCOVY) 200-25 mg tablet Take 1 tablet by mouth once daily.  Take along with your Edurant. 30 tablet 3   ??? meloxicam (MOBIC) 7.5 MG tablet      ??? oxybutynin (DITROPAN-XL) 10 MG 24 hr tablet Continuous.     ??? rilpivirine HCl (EDURANT) 25 mg Tab Take  one tablet by mouth once daily.  Take along with your Descovy. 30 tablet 3   ??? triamterene-hydrochlorothiazide (MAXZIDE-25) 37.5-25 mg per tablet Take 0.5 (one-half) tablet by mouth daily. 45 tablet 3   ??? valACYclovir (VALTREX) 500 MG tablet Continuous.     ??? venlafaxine (EFFEXOR-XR) 150 MG 24 hr capsule Take 1 capsule (150 mg total) by mouth daily. 90 capsule 2     No current facility-administered medications for this visit.       No Known Allergies    There is no problem list on file for this patient.      Reviewed and up to date in Epic.    Appropriateness of Therapy Acute infections noted within Epic:  No active infections  Patient reported infection: None    Are the medications and dosages  appropriate based on diagnosis and infection status? Yes    Prescriptions havebeen clinically reviewed: Yes      Baseline Quality of Life Assessment      How many days over the past month did your HIV  keep you from your normal activities? For example, brushing your teeth or getting up in the morning. 0    Financial Information     Medication Assistance provided: None Required    Anticipated copay of $0.00 for Descovy and $0.00 for Edurant reviewed with patient. Verified delivery address.    Delivery Information     Scheduled delivery date: 04/30/21    Expected start date: 04/30/21    Medication will be delivered via UPS to the prescription address in [facility] [place].  This shipment will not require a signature.      Explained the services we provide at [facility] and that each month we would call to set up refills.  Stressed importance of returning phone calls so that we could ensure they receive their medications in time each month.  Informed patient that we should be setting up refills 7-10 days prior to when they will run out of medication.  A pharmacist will reach out to perform a clinical assessment periodically.  Informed patient that a welcome packet, containing information about our pharmacy and other support services, a Notice of Privacy Practices, and a drug information handout will be sent.      The patient or caregiver noted above participated in the development of this care plan and knows that they can request review of or adjustments to the care plan at any time.      Patient or caregiver verbalized understanding of the above information as well as how to contact the  pharmacy at [phone] option 4 with any questions/concerns.  The pharmacy is open Monday through Friday 8:30am-4:30pm.  A pharmacist is available 24/7 via pager to answer any clinical questions they may have.    Patient Specific Needs     - Does the patient have any physical, cognitive, or cultural barriers? No    - Does the patient have adequate living arrangements? (i.e. the ability to store and take their medication appropriately) Yes    - Did you identify any home environmental safety or security hazards? No    - Patient prefers to have medications discussed with  Patient     - Is the patient or caregiver able to read and understand education materials at a high school level or above? Yes    - Patient's primary language is  English     - Is the patient high risk? No    - Does the patient require physician intervention or other additional services (i.e. dietary/nutrition, smoking cessation, social work)? No      Roderic Palau  [facility] Specialty Pharmacist

## 2021-05-06 NOTE — Unmapped (Signed)
Attempted to contact patient by phone. Left discrete message for patient to call clinic at [phone].  I am calling to discuss:  1. 3.2 K+, Wanted to encourage patient to eat K-rich foods

## 2021-05-29 NOTE — Unmapped (Signed)
[facility] Clinical Assessment & Refill Coordination Note    Karen Strickland, DOB: June 30, 1966  Phone: [phone] (home)     All above HIPAA information was verified with patient.     Was a Nurse, learning disability used for this call? No    Specialty Medication(s):   Infectious Disease: Descovy and Edurant     Current Outpatient Medications   Medication Sig Dispense Refill   ??? amLODIPine (NORVASC) 5 MG tablet Take 2.5 mg by mouth. (Patient not taking: Reported on 04/24/2021)     ??? emtricitabine-tenofovir alafen (DESCOVY) 200-25 mg tablet Take 1 tablet by mouth once daily.  Take along with your Edurant. 30 tablet 3   ??? meloxicam (MOBIC) 7.5 MG tablet      ??? oxybutynin (DITROPAN-XL) 10 MG 24 hr tablet Continuous.     ??? rilpivirine HCl (EDURANT) 25 mg Tab Take  one tablet by mouth once daily.  Take along with your Descovy. 30 tablet 3   ??? triamterene-hydrochlorothiazide (MAXZIDE-25) 37.5-25 mg per tablet Take 0.5 (one-half) tablet by mouth daily. 45 tablet 3   ??? valACYclovir (VALTREX) 500 MG tablet Continuous.     ??? venlafaxine (EFFEXOR-XR) 150 MG 24 hr capsule Take 1 capsule (150 mg total) by mouth daily. 90 capsule 2     No current facility-administered medications for this visit.        Changes to medications: Karen Strickland reports no changes at this time.    No Known Allergies    Changes to allergies: No    SPECIALTY MEDICATION ADHERENCE     Descovy 200-25 mg: 6 to 7 days of medicine on hand   Edurant 25 mg: 6 to 7 days of medicine on hand       Medication Adherence    Patient reported X missed doses in the last month: 0  Specialty Medication: Descovy 200-25mg   Patient is on additional specialty medications: Yes  Additional Specialty Medications: Edurant 25mg   Patient Reported Additional Medication X Missed Doses in the Last Month: 0  Patient is on more than two specialty medications: No  Any gaps in refill history greater than 2 weeks in the last 3 months: no  Demonstrates understanding of importance of adherence: yes  Informant: patient  Provider-estimated medication adherence level: good  Patient is at risk for Non-Adherence: Yes  Reasons for non-adherence: patient has problems affording medications   Other non-adherence reason: Almost out of medication and has not completed assistance applications for HMAP, PAP or manufacturer assistance.           Specialty medication(s) dose(s) confirmed: Karen Strickland was chanted to the individual components Descovy and Edurant due to needing a temporary PAP     Are there any concerns with adherence? Yes: The main concern is that she is almost out and has not completed the HMAP, PAP, or mfg assistance applications and will probably run out of medication before any type of assistance is approved,    Adherence counseling provided? I transferred the the benefits counselor within the [facility]    CLINICAL MANAGEMENT AND INTERVENTION      Clinical Benefit Assessment:    Do you feel the medicine is effective or helping your condition? Yes    HIV ASSOCIATED LABS:     Lab Results   Component Value Date/Time    HIVRS Detected (A) 04/24/2021 05:05 PM    HIVRS Detected (A) 02/11/2021 09:57 AM    HIVCP 469 (H) 04/24/2021 05:05 PM    HIVCP  16,109 (H) 02/11/2021 09:57 AM    ACD4 1,008 04/24/2021 05:05 PM    ACD4 702 02/11/2021 09:57 AM       Clinical Benefit counseling provided? Not needed    Adverse Effects Assessment:    Are you experiencing any side effects? No    Are you experiencing difficulty administering your medicine? No    Quality of Life Assessment:       How many days over the past month did your HIV  keep you from your normal activities? For example, brushing your teeth or getting up in the morning. 0    Have you discussed this with your provider? Not needed    Acute Infection Status:    Acute infections noted within Epic:  No active infections  Patient reported infection: None    Therapy Appropriateness:    Is therapy appropriate? Yes, therapy is appropriate and should be continued    DISEASE/MEDICATION-SPECIFIC INFORMATION      N/A    PATIENT SPECIFIC NEEDS     - Does the patient have any physical, cognitive, or cultural barriers? No    - Is the patient high risk? No    - Does the patient require a Care Management Plan? No     - Does the patient require physician intervention or other additional services (i.e. nutrition, smoking cessation, social work)? No      SHIPPING     Specialty Medication(s) to be Shipped:   Infectious Disease: Unable to ship medications at this time due to not having insurance or assistance and the patient cannot afford the full cash price of her medications    Other medication(s) to be shipped: No additional medications requested for fill at this time     Changes to insurance: Yes. She has no insurance now.    Delivery Scheduled: No. I transferred her to the [facility]'s benefits counselor so she can talk to them about her assistance application . I routed this encounter to her ID provider.    The patient will receive a drug information handout for each medication shipped and additional FDA Medication Guides as required.  Verified that patient has previously received a Conservation officer, historic buildings and a Surveyor, mining.    The patient or caregiver noted above participated in the development of this care plan and knows that they can request review of or adjustments to the care plan at any time.      All of the patient's questions and concerns have been addressed.    Roderic Palau   [facility] Specialty Pharmacist

## 2021-06-06 NOTE — Unmapped (Signed)
Opened encounter in error  

## 2021-06-15 NOTE — Unmapped (Signed)
Attempted to contact patient by phone. Left discrete message for patient to call clinic at [phone].  I am calling to discuss:  1. Received message from [facility] that patient was about to run out of ART but has not yet submitted paperwork needed to continue funding for medication. Calling patient to follow up about this.

## 2021-07-10 NOTE — Unmapped (Signed)
Specialty Medication(s): Descovy 200-25mg  and Edurant 25mg  (was used to replace Odefsey for her temp PAP)    Karen Strickland has been dis-enrolled from the [facility] specialty pharmacy services due to multiple unsuccessful outreach attempts by the MAPS tech to apply for mfg assist on Odefsey and has not completed her HMAP.  I can re-enroll her in the future if needed    Additional information provided to the patient: n/a    Roderic Palau  [facility] Specialty Pharmacist

## 2021-09-01 DIAGNOSIS — F32A Depression, unspecified depression type: Principal | ICD-10-CM

## 2021-09-01 DIAGNOSIS — I10 Essential (primary) hypertension: Principal | ICD-10-CM

## 2021-09-01 MED ORDER — VENLAFAXINE ER 150 MG CAPSULE,EXTENDED RELEASE 24 HR
ORAL_CAPSULE | Freq: Every day | ORAL | 3 refills | 90 days | Status: CP
Start: 2021-09-01 — End: ?

## 2021-09-01 MED ORDER — VALACYCLOVIR 500 MG TABLET
ORAL_TABLET | Freq: Every day | ORAL | 3 refills | 90.00000 days | Status: CP
Start: 2021-09-01 — End: ?

## 2021-09-01 MED ORDER — OXYBUTYNIN CHLORIDE ER 10 MG TABLET,EXTENDED RELEASE 24 HR
ORAL_TABLET | Freq: Every day | ORAL | 3 refills | 90 days | Status: CP
Start: 2021-09-01 — End: ?

## 2021-09-01 MED ORDER — TRIAMTERENE 37.5 MG-HYDROCHLOROTHIAZIDE 25 MG TABLET
ORAL_TABLET | Freq: Every day | ORAL | 3 refills | 90.00000 days | Status: CP
Start: 2021-09-01 — End: ?

## 2021-09-01 MED ORDER — AMLODIPINE 5 MG TABLET
ORAL_TABLET | Freq: Every day | ORAL | 1 refills | 180 days | Status: CP
Start: 2021-09-01 — End: ?

## 2021-10-30 ENCOUNTER — Ambulatory Visit: Admit: 2021-10-30 | Discharge: 2021-10-30 | Attending: Family | Primary: Family

## 2021-10-30 ENCOUNTER — Ambulatory Visit
Admit: 2021-10-30 | Discharge: 2021-10-30 | Attending: Student in an Organized Health Care Education/Training Program | Primary: Student in an Organized Health Care Education/Training Program

## 2021-10-30 DIAGNOSIS — A6000 Herpesviral infection of urogenital system, unspecified: Principal | ICD-10-CM

## 2021-10-30 DIAGNOSIS — Z713 Dietary counseling and surveillance: Principal | ICD-10-CM

## 2021-10-30 DIAGNOSIS — Z113 Encounter for screening for infections with a predominantly sexual mode of transmission: Principal | ICD-10-CM

## 2021-10-30 DIAGNOSIS — B20 Human immunodeficiency virus [HIV] disease: Principal | ICD-10-CM

## 2021-10-30 DIAGNOSIS — Z0000 Encounter for general adult medical examination without abnormal findings: Principal | ICD-10-CM

## 2021-10-30 DIAGNOSIS — F32A Depression, unspecified depression type: Principal | ICD-10-CM

## 2021-10-30 DIAGNOSIS — I10 Essential (primary) hypertension: Principal | ICD-10-CM

## 2021-10-30 MED ORDER — VALACYCLOVIR 1 GRAM TABLET
ORAL_TABLET | Freq: Every day | ORAL | 0 refills | 30.00000 days | Status: CP
Start: 2021-10-30 — End: ?
  Filled 2021-10-30: qty 30, 30d supply, fill #0

## 2021-10-30 MED ORDER — VENLAFAXINE ER 150 MG CAPSULE,EXTENDED RELEASE 24 HR
ORAL_CAPSULE | Freq: Every day | ORAL | 2 refills | 60 days | Status: CP
Start: 2021-10-30 — End: ?
  Filled 2021-10-30: qty 30, 30d supply, fill #0

## 2021-10-30 MED ORDER — AMLODIPINE 5 MG TABLET
ORAL_TABLET | Freq: Every day | ORAL | 1 refills | 180.00000 days | Status: CP
Start: 2021-10-30 — End: ?
  Filled 2021-10-30: qty 15, 30d supply, fill #0

## 2021-10-30 MED ORDER — ODEFSEY 200 MG-25 MG-25 MG TABLET
ORAL_TABLET | Freq: Every day | ORAL | 0 refills | 30.00000 days | Status: CP
Start: 2021-10-30 — End: ?

## 2021-11-04 DIAGNOSIS — B20 Human immunodeficiency virus [HIV] disease: Principal | ICD-10-CM

## 2021-11-19 NOTE — Unmapped (Signed)
Social Work Health Education/Risk Reduction Note    Duration of Intervention: 1 minutes    REASON/TYPE OF CONTACT: Phone    INTERVENTION:  SW contacted pt for health education. SW attempted to contact pt about STI testing and treatment, but pt did not answer. Unable to leave voicemail. Not in service phone message.    Abby Muehlstein  ID Clinic, MSW Intern

## 2021-11-23 ENCOUNTER — Ambulatory Visit: Admit: 2021-11-23 | Attending: Registered" | Primary: Registered"

## 2021-11-23 DIAGNOSIS — B20 Human immunodeficiency virus [HIV] disease: Principal | ICD-10-CM

## 2021-11-23 MED ORDER — ODEFSEY 200 MG-25 MG-25 MG TABLET
ORAL_TABLET | Freq: Every day | ORAL | 5 refills | 30 days | Status: CP
Start: 2021-11-23 — End: ?

## 2021-11-24 DIAGNOSIS — A6000 Herpesviral infection of urogenital system, unspecified: Principal | ICD-10-CM

## 2021-11-24 DIAGNOSIS — B20 Human immunodeficiency virus [HIV] disease: Principal | ICD-10-CM

## 2021-11-24 MED ORDER — VALACYCLOVIR 1 GRAM TABLET
ORAL_TABLET | Freq: Every day | ORAL | 5 refills | 30.00000 days | Status: CP
Start: 2021-11-24 — End: ?
  Filled 2021-11-25: qty 30, 30d supply, fill #0

## 2021-11-24 MED ORDER — GENVOYA 150 MG-150 MG-200 MG-10 MG TABLET
ORAL_TABLET | Freq: Every day | ORAL | 11 refills | 30 days | Status: CP
Start: 2021-11-24 — End: ?
  Filled 2021-11-25: qty 30, 30d supply, fill #0

## 2021-11-24 NOTE — Unmapped (Signed)
[facility] Specialty Medication Onboarding    Specialty Medication: GENVOYA 150-150-200-10 mg Tab tablet (elvitegravir-cobicistat-emtricitabine-tenofovir)  Prior Authorization: Not Required   Financial Assistance: Yes - copay card approved as secondary   Final Copay/Day Supply: $0 / 30    Insurance Restrictions: None     Notes to Pharmacist:     The triage team has completed the benefits investigation and has determined that the patient is able to fill this medication at [facility]. Please contact the patient to complete the onboarding or follow up with the prescribing physician as needed.

## 2021-11-24 NOTE — Unmapped (Signed)
Have continued to have delays with accessing medications. Benefits coordinator ahd been communicating with HMAP and at last contact (this week) asked to show documentation from [facility](preferred specialty pharmacy) why Luisa Hart is not being covered and how much medication is. However, at this time, patient has gone without ART for some time and would benefit from restarting any regimen. ID pharmacist called insurance plan today and determined that patient may be able to have Genvoya or Atipla covered. Will move forward with getting Genvoya for patient.     Contacted patient via mobile and confirmed two identifiers.  Explained insurance situation to patient and barriers to getting patient ART. At this time patient amenable to starting on Genvoya. 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 Script sent to [employer] for test claim. Await response. Otherwise will send to [facility]. If barriers continue to be encountered, will consider referring to STARs study.

## 2021-11-24 NOTE — Unmapped (Addendum)
[facility]   Patient Onboarding/Medication Counseling    Ms.Daugherty is a 56 y.o. female with HIV who I am counseling today on initiation of therapy.  I am speaking to the patient.    Was a Nurse, learning disability used for this call? No    Verified patient's date of birth / HIPAA.    Specialty medication(s) to be sent: Infectious Disease: Genvoya      Non-specialty medications/supplies to be sent: amlodipine, valacyclovir and venlafaxine    Medications not needed at this time: n/a         Genvoya (elvitegravir, cobicistat, emtricitabine, and tenofovir alafenamide)      Medication & Administration     Dosage: Take 1 tablet by mouth once daily    Administration: Take with food    Adherence/Missed dose instructions: take missed dose as soon as you remember. If it is close to the time of your next dose, skip the dose and resume with your next scheduled dose.    Goals of Therapy     To keep HIV levels at a non-detectable level on lab tests    Side Effects & Monitoring Parameters   Common Side Effects:   ??? Headache  ??? Upset stomach  ??? Feeling tired or weak.  ??? Diarrhea.    The following side effects should be reported to the provider:  ??? Signs of an allergic reaction, like rash; hives; itching; red, swollen, blistered, or peeling skin with or without fever; wheezing; tightness in the chest or throat; trouble breathing, swallowing, or talking; unusual hoarseness; or swelling of the mouth, face, lips, tongue, or throat.  ??? Signs of kidney problems like unable to pass urine, change in how much urine is passed, blood in the urine, or a big weight gain  ??? Signs of liver problems like dark urine, feeling tired, not hungry, upset stomach or stomach pain, light-colored stools, throwing up, or yellow skin or eyes  ??? Signs of too much lactic acid in the blood (lactic acidosis) like fast breathing, fast heartbeat, a heartbeat that does not feel normal, very bad upset stomach or throwing up, feeling very sleepy, shortness of breath, feeling very tired or weak, very bad dizziness, feeling cold, or muscle pain or cramps  ??? Signs of infection like fever, sore throat, weakness, cough, or shortness of breath.    Monitoring Parameters:   ??? CBC with differential  ??? reticulocyte count  ???  creatine kinase  ???  CD4 count  ???  HIV RNA plasma levels  ??? serum creatinine  ??? estimated creatinine clearance  ??? urine glucose and urine protein at baseline and as clinically indicated during therapy  ??? serum phosphorous at baseline and as clinically indicated during therapy in patients with chronic kidney disease  ??? hepatic function tests  ??? testing for HBV at baseline    Contraindications, Warnings, & Precautions   ??? BBW: Acute, severe exacerbations of hepatitis B have been reported in patients coinfected with HIV-1 and HBV following discontinuation of Genvoya   ??? Immune reconstitution syndrome resulting in the occurrence of an inflammatory response to an indolent or residual opportunistic infection or activation of autoimmune disorders (eg, Graves' disease, polymyositis, Guillain-Barr?? syndrome)   ??? Lactic acidosis/severe hepatomegaly  ??? Renal toxicity: patients with impaired renal function and those with concurrent or recent nephrotoxic therapy (including NSAID use) are at an increased risk.     Drug/Food Interactions   ??? Medication list reviewed in Epic. The patient was instructed to  inform the care team before taking any new medications or supplements.   ??? Drug interactions:   o Amlodipine: Genovya increases levels of amlodipine. Cautioned patient to monitor BP and watch for signs of hypotension and for edema and report lowered BP and symptoms to her PCP  o Oxybutynin: Genvoya increases levels of oxybutynin. Cautioned patient to watch for signs of the levels being too high such as dry mouth, constipation, blurry vision, dry eyes, and tachcardia.      Storage, Handling Precautions, & Disposal   ??? Store in the original container at room temperature.  ??? Keep lid tightly closed.  ??? Store in a dry place. Do not store in a bathroom.  ??? Keep all drugs in a safe place. Keep all drugs out of the reach of children and pets  ??? Throw away unused or expired drugs. Do not flush down a toilet or pour down a drain unless you are told to do so. Check with your pharmacist if you have questions about the best way to throw out drugs. There may be drug take-back programs in your area.        Current Medications (including OTC/herbals), Comorbidities and Allergies     Current Outpatient Medications   Medication Sig Dispense Refill   ??? amLODIPine (NORVASC) 5 MG tablet Take 0.5 tablets (2.5 mg total) by mouth daily. 90 tablet 1   ??? elvitegravir-cobicistat-emtricitabine-tenofovir (GENVOYA) 150-150-200-10 mg Tab tablet Take 1 tablet by mouth daily. 30 tablet 11   ??? meloxicam (MOBIC) 7.5 MG tablet  (Patient not taking: Reported on 11/24/2021)     ??? oxybutynin (DITROPAN-XL) 10 MG 24 hr tablet Take 1 tablet (10 mg total) by mouth daily. 90 tablet 3   ??? triamterene-hydrochlorothiazide (MAXZIDE-25) 37.5-25 mg per tablet Take 0.5 (one-half) tablet by mouth daily. 45 tablet 3   ??? valACYclovir (VALTREX) 1000 MG tablet Take 1 tablet (1,000 mg total) by mouth daily. 30 tablet 5   ??? venlafaxine (EFFEXOR-XR) 150 MG 24 hr capsule Take 1 capsule (150 mg total) by mouth daily. 60 capsule 2     No current facility-administered medications for this visit.       No Known Allergies    There is no problem list on file for this patient.      Reviewed and up to date in Epic.    Appropriateness of Therapy     Acute infections noted within Epic:  No active infections  Patient reported infection: None    Is medication and dose appropriate based on diagnosis and infection status? Yes    Prescription has been clinically reviewed: Yes      Baseline Quality of Life Assessment      How many days over the past month did your HIV  keep you from your normal activities? For example, brushing your teeth or getting up in the morning. 0    Financial Information     Medication Assistance provided: Copay Assistance    Anticipated copay of $0.00 reviewed with patient. Verified delivery address.    Delivery Information     Scheduled delivery date: 11/26/21    Expected start date: 11/26/21    Medication will be delivered via UPS to the prescription address in [facility].  This shipment will not require a signature.      Explained the services we provide at [facility] and that each month we would call to set up refills.  Stressed importance of returning phone calls so that we  could ensure they receive their medications in time each month.  Informed patient that we should be setting up refills 7-10 days prior to when they will run out of medication.  A pharmacist will reach out to perform a clinical assessment periodically.  Informed patient that a welcome packet, containing information about our pharmacy and other support services, a Notice of Privacy Practices, and a drug information handout will be sent.      The patient or caregiver noted above participated in the development of this care plan and knows that they can request review of or adjustments to the care plan at any time.      Patient or caregiver verbalized understanding of the above information as well as how to contact the pharmacy at [phone] option 4 with any questions/concerns.  The pharmacy is open Monday through Friday 8:30am-4:30pm.  A pharmacist is available 24/7 via pager to answer any clinical questions they may have.    Patient Specific Needs     - Does the patient have any physical, cognitive, or cultural barriers? No    - Does the patient have adequate living arrangements? (i.e. the ability to store and take their medication appropriately) Yes    - Did you identify any home environmental safety or security hazards? No    - Patient prefers to have medications discussed with  Patient     - Is the patient or caregiver able to read and understand education materials at a high school level or above? Yes    - Patient's primary language is  English     - Is the patient high risk? No        Roderic Palau  [facility] Specialty Pharmacist

## 2021-11-25 MED FILL — AMLODIPINE 5 MG TABLET: ORAL | 30 days supply | Qty: 15 | Fill #0

## 2021-11-25 MED FILL — VENLAFAXINE ER 150 MG CAPSULE,EXTENDED RELEASE 24 HR: ORAL | 30 days supply | Qty: 30 | Fill #0

## 2021-12-04 ENCOUNTER — Telehealth: Payer: Self-pay | Admitting: Family Medicine

## 2021-12-04 NOTE — Telephone Encounter (Signed)
Lvm for pt to schedule cpe ?

## 2021-12-21 NOTE — Unmapped (Signed)
[facility] Clinical Assessment & Refill Coordination Note    Karen Strickland, DOB: 04-Mar-1966  Phone: [phone] (home)     All above HIPAA information was verified with patient.     Was a Nurse, learning disability used for this call? No    Specialty Medication(s):   Infectious Disease: Genvoya     Current Outpatient Medications   Medication Sig Dispense Refill    amLODIPine (NORVASC) 5 MG tablet Take 0.5 tablets (2.5 mg total) by mouth daily. 90 tablet 1    elvitegravir-cobicistat-emtricitabine-tenofovir (GENVOYA) 150-150-200-10 mg Tab tablet Take 1 tablet by mouth daily. 30 tablet 11    meloxicam (MOBIC) 7.5 MG tablet  (Patient not taking: Reported on 11/24/2021)      oxybutynin (DITROPAN-XL) 10 MG 24 hr tablet Take 1 tablet (10 mg total) by mouth daily. 90 tablet 3    triamterene-hydrochlorothiazide (MAXZIDE-25) 37.5-25 mg per tablet Take 0.5 (one-half) tablet by mouth daily. 45 tablet 3    valACYclovir (VALTREX) 1000 MG tablet Take 1 tablet (1,000 mg total) by mouth daily. 30 tablet 5    venlafaxine (EFFEXOR-XR) 150 MG 24 hr capsule Take 1 capsule (150 mg total) by mouth daily. 60 capsule 2     No current facility-administered medications for this visit.        Changes to medications: Sarahmarie reports no changes at this time.    No Known Allergies    Changes to allergies: No    SPECIALTY MEDICATION ADHERENCE     Genvoya   : 8 days of medicine on hand       Medication Adherence    Patient reported X missed doses in the last month: 0  Specialty Medication: Genvoya  Patient is on additional specialty medications: No  Any gaps in refill history greater than 2 weeks in the last 3 months: no  Demonstrates understanding of importance of adherence: yes  Informant: patient  Provider-estimated medication adherence level: good  Patient is at risk for Non-Adherence: No          Specialty medication(s) dose(s) confirmed: Regimen is correct and unchanged.     Are there any concerns with adherence? No    Adherence counseling provided? Not needed    CLINICAL MANAGEMENT AND INTERVENTION      Clinical Benefit Assessment:    Do you feel the medicine is effective or helping your condition? Yes    Clinical Benefit counseling provided? Not needed    Adverse Effects Assessment:    Are you experiencing any side effects? No    Are you experiencing difficulty administering your medicine? No    Quality of Life Assessment:  How many days over the past month did your HIV  keep you from your normal activities? For example, brushing your teeth or getting up in the morning. 0    Have you discussed this with your provider? Not needed    Acute Infection Status:    Acute infections noted within Epic:  No active infections  Patient reported infection: None    Therapy Appropriateness:    Is therapy appropriate and patient progressing towards therapeutic goals? Yes, therapy is appropriate and should be continued    DISEASE/MEDICATION-SPECIFIC INFORMATION      N/A    PATIENT SPECIFIC NEEDS     Does the patient have any physical, cognitive, or cultural barriers? No    Is the patient high risk? No    Does the patient require a Care Management Plan? No  SHIPPING     Specialty Medication(s) to be Shipped:   Infectious Disease: Genvoya    Other medication(s) to be shipped:  amlodipine 5mg , valacyclovir 1000mg  and venlafaxine XR 150mg      Changes to insurance: No    Delivery Scheduled: Yes, Expected medication delivery date: 12/23/21.     Medication will be delivered via UPS to the confirmed prescription address in [facility].    The patient will receive a drug information handout for each medication shipped and additional FDA Medication Guides as required.  Verified that patient has previously received a Conservation officer, historic buildings and a Surveyor, mining.    The patient or caregiver noted above participated in the development of this care plan and knows that they can request review of or adjustments to the care plan at any time.      All of the patient's questions and concerns have been addressed.    Roderic Palau   [facility] Specialty Pharmacist

## 2021-12-22 MED FILL — AMLODIPINE 5 MG TABLET: ORAL | 30 days supply | Qty: 15 | Fill #1

## 2021-12-22 MED FILL — VALACYCLOVIR 1 GRAM TABLET: ORAL | 30 days supply | Qty: 30 | Fill #1

## 2021-12-22 MED FILL — GENVOYA 150 MG-150 MG-200 MG-10 MG TABLET: ORAL | 30 days supply | Qty: 30 | Fill #1

## 2021-12-22 MED FILL — VENLAFAXINE ER 150 MG CAPSULE,EXTENDED RELEASE 24 HR: ORAL | 30 days supply | Qty: 30 | Fill #1

## 2022-01-15 NOTE — Unmapped (Signed)
[facility] Refill Coordination Note    Specialty Medication(s) to be Shipped:   Infectious Disease: Genvoya    Other medication(s) to be shipped: amlodipine 5mg , valacyclovir 1000mg , venlafaxine 150mg      Karen Strickland, DOB: 06/25/1966  Phone: [phone] (home)       All above HIPAA information was verified with patient.     Was a Nurse, learning disability used for this call? No    Completed refill call assessment today to schedule patient's medication shipment from the [facility] [phone]).  All relevant notes have been reviewed.     Specialty medication(s) and dose(s) confirmed: Regimen is correct and unchanged.   Changes to medications: [name] reports no changes at this time.  Changes to insurance: No  New side effects reported not previously addressed with a pharmacist or physician: None reported  Questions for the pharmacist: No    Confirmed patient received a Conservation officer, historic buildings and a Surveyor, mining with first shipment. The patient will receive a drug information handout for each medication shipped and additional FDA Medication Guides as required.       DISEASE/MEDICATION-SPECIFIC INFORMATION        N/A    SPECIALTY MEDICATION ADHERENCE     Medication Adherence    Patient reported X missed doses in the last month: 0  Specialty Medication: genvoya  Patient is on additional specialty medications: No  Informant: patient              Were doses missed due to medication being on hold? No    Genvoya 150-150-200-10 mg: 10 days of medicine on hand     REFERRAL TO PHARMACIST     Referral to the pharmacist: Not needed      [facility]     Shipping address confirmed in Epic.     Delivery Scheduled: Yes, Expected medication delivery date: 01/19/22.     Medication will be delivered via UPS to the prescription address in Epic W[facility].    Jasper Loser   [facility] Specialty Technician

## 2022-01-18 DIAGNOSIS — B20 Human immunodeficiency virus [HIV] disease: Principal | ICD-10-CM

## 2022-01-18 MED FILL — GENVOYA 150 MG-150 MG-200 MG-10 MG TABLET: ORAL | 30 days supply | Qty: 30 | Fill #2

## 2022-01-18 MED FILL — AMLODIPINE 5 MG TABLET: ORAL | 30 days supply | Qty: 15 | Fill #2

## 2022-01-18 MED FILL — VALACYCLOVIR 1 GRAM TABLET: ORAL | 30 days supply | Qty: 30 | Fill #2

## 2022-01-18 MED FILL — VENLAFAXINE ER 150 MG CAPSULE,EXTENDED RELEASE 24 HR: ORAL | 30 days supply | Qty: 30 | Fill #2

## 2022-01-19 NOTE — Unmapped (Signed)
Corazon said she could see this patient on June 15th at 1:30 if patient would like to make appointment with her

## 2022-01-21 NOTE — Unmapped (Signed)
Linkage and Retention Coordinator spoke confidentially with patient to schedule appointment for retention. Appointment is set for 03/02/2022. Patient stated that they could not be seen until after 6/8 due to students still being in school.    Barriers to Care:   1. Are you experiencing any of the following?   ??? Lack of transportation to medical appointments? No  ??? Housing instability? No  ??? If you are currently employed, are you having trouble taking time off work for appointments? No  ??? Financial concerns (Rent, utilities, food, etc.?) No  ??? Lack of consistent access to food? No  ??? Need help caring for child or family member No  ??? Trouble remembering and attending your appointments No  2. Are you experiencing any other barrier to care that prevent you from attending your appointments or taking your medications regularly?  ??? 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    Notes: Patient asked if [facility] Internal Med appt could be scheduled the same day. Informed the patient that we do not schedule those appointments but would let the internal med admins know. Informed internal med admin about patient request. They stated that they would reach out to the patient to schedule an appointment.     [facility] Care Plan: Viral Load Reduction Patient     Patient's last ID clinic appt: 10/30/2021    Is the patient considered to have a detectable viral load? Yes; Last Viral Load: 10/30/2021 (29805 copies)     Goal:  Patient will have an office visit within 6 weeks of identification of detectable viral load lab (>50 c/mL) unless stated otherwise by their provider.     Contact Interventions may include:  ??? Placing phone calls to the patient and/or listed contacts.  ??? Sending the patient an Abnormal Lab text message through the [employer] App or [facility] cell phone (if a texting agreement is signed).  ??? Sending the patient an Abnormal Lab MyChart/letter.  ?? Contacting the patient's most recent pharmacies as needed for additional information such as contact information or refill history.  ?? Contacting the patient's case manager/caregiver as needed for additional information, if assigned.  ?? Researching other online resources as needed.     If the patient is reached, the [employer] will attempt to:  ?? Update all contact information including primary and secondary phone numbers and emergency contact details and address. Confirm access to MyChart.  ?? Complete the Detectable Viral Load Intervention questionnaire with the patient.  ?? Explore and discuss contributing factors to the last detectable viral load including: Medication access, tolerability or side effects, frequency of missed doses of ART, and other medication adherence concerns.  ?? Explore and discuss any barriers to visit attendance the patient may be experiencing that result in missed appointments, no-shows, cancellations, or lack of a scheduled follow-up appointment. These may include communication barriers (language, phone access, texting, MyChart), transportation, financial concerns, time off work, mental health/substance use, and/or multiple medical appointments.  ?? Make appropriate referrals and link the patient to clinic nurses, social workers, benefits counselors, or other support services in the [facility] ID clinic to address concerns or barriers to care.  ?? Schedule a follow-up appointment at an appropriate interval based on a completed assessment.  ?? Monitor attendance of the patient's scheduled appointment and provide reminder  calls/texts/messages consistent with patient preference, if needed.  ?? Continue follow-up as indicated.     Expected Outcome:  The patient will have a return visit within 6 weeks of identification of detectable viral load lab (>50 c/mL) unless stated otherwise by their provider.    If the patient has an urgent medical problem send a chat message to the clinic nursing staff for immediate follow-up.     **If the patient is not able to be located or retained in HIV care, a referral will be placed to the [facility] Counselor for further follow-up.    Duration of Service: 15 minutes    Karen Strickland  [facility]

## 2022-02-24 NOTE — Unmapped (Signed)
[facility] Refill Coordination Note    Specialty Medication(s) to be Shipped:   Infectious Disease: Genvoya    Other medication(s) to be shipped: amlodipine 5mg , valacyclovir 1000mg , venlafaxine 150mg      Karen Strickland, DOB: [birth year]  Phone: [phone] (home)       All above HIPAA information was verified with patient.     Was a Nurse, learning disability used for this call? No    Completed refill call assessment today to schedule patient's medication shipment from the [facility] [phone]).  All relevant notes have been reviewed.     Specialty medication(s) and dose(s) confirmed: Regimen is correct and unchanged.   Changes to medications: Jaquisha reports no changes at this time.  Changes to insurance: No  New side effects reported not previously addressed with a pharmacist or physician: None reported  Questions for the pharmacist: No    Confirmed patient received a Conservation officer, historic buildings and a Surveyor, mining with first shipment. The patient will receive a drug information handout for each medication shipped and additional FDA Medication Guides as required.       DISEASE/MEDICATION-SPECIFIC INFORMATION        N/A    SPECIALTY MEDICATION ADHERENCE     Medication Adherence    Patient reported X missed doses in the last month: 0  Specialty Medication: genvoya  Patient is on additional specialty medications: No  Patient is on more than two specialty medications: No  Any gaps in refill history greater than 2 weeks in the last 3 months: no  Demonstrates understanding of importance of adherence: yes              Were doses missed due to medication being on hold? No    Genvoya 150-150-200-10 mg: 6-8 days of medicine on hand     REFERRAL TO PHARMACIST     Referral to the pharmacist: Not needed      [facility]     Shipping address confirmed in Epic.     Delivery Scheduled: Yes, Expected medication delivery date: 03/02/22 .     Medication will be delivered via UPS to the prescription address in Epic WAM.    Ricci Barker   [facility] Specialty Technician

## 2022-02-25 NOTE — Unmapped (Signed)
patient  requested to cancel and declined reschedule

## 2022-03-01 NOTE — Unmapped (Signed)
[facility]  [address]  [place], [place]  [address]  P [phone]  F [phone]     Primary care provider: Mack Guise, MD      Assessment/Plan:      HIV (dx'd 2004)  - chronic, stable  Health Department came to her home and told she needed testing.  Participated in study that started her on Edurant(RPV) and Truvada(FTC/TDF)-equivalent to Complera  Started on Odefsy which is covered on current insurance plan in 01/2021. (Patient's insurance did not cover for Complera. Odefsy is equivalent, but has TAF rather than TDF. Patient amenable to starting this.)    Overall doing well. Current regimen: Genvoya (EVG/c/FTC/TAF) Was lost to follow up and has not had ART.  Misses doses of ARVs never     Med access through insurance  CD4 count  see below  Discussed ARV adherence and taking ARVs with food    Lab Results   Component Value Date    ACD4 560 10/30/2021    CD4 40 10/30/2021    HIVRS Detected (A) 10/30/2021    HIVCP 29,805 (H) 10/30/2021     CD4, HIV RNA, and safety labs (full return panel)  Continue current therapy. Started on [place] on 11/19/21  Discussed importance of ARV adherence  Very interested in [place]. Discussed benefits/risks of treatment with injectable modality. Patient would really like to start this. Could also have future Cabenuva injections at local [facility].  Until patient can get on Cabenuva, will restart patient on Genvoya. Had thought to start on Juluca but is not approved for this indication since patient not undetectable for 6 months.      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  Patient also has oral lesions, denies any genital lesions.  Will start on suppressive HSV therapy, particularly since patient has had uncontrolled HIV.  RX sent for Valtrex 1 gram daily.      Depression  -  acute, exacerbated  Not currently on therapy  Patient has had two traumatic losses in her life (two family members killed within the same week) and has had financial difficulties that has been quite stressful  Continues to experience stressful events. Her boyfriend has serious medical issues, daughter and grandsons living with her in 1-bedroom apartment, having work stress.  Had been started on Effexor, but ran out.      BMI 37 - chronic  Would like to lose weight, she had been able to lose weight previously but weight came back after all the happenings this past year.  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  Will need to keep in mind, weight neutral ART regimen if needing to change ART      History of CoVid infection (02/2019) - acute, self-limited  Symptoms included: Fever, body aches. Denies loss of taste or smell, coughing, GI complaints  Symptoms resolved.  Needs CoVid BV booster. Declines today.       Hypertension  - chronic, improving  123/81 on visit 04/24/21  Continue Triamterene/HCTZ 37.5/25mg  daily.      Monkeypox Education  Discussed current outbreak, risk factors, lesion recognition, and clinical signs/symptoms including fever, headache, myalgias, and fatigue followed a few days later by a rash. Discussed varied appearance of rash including stages of evolution.   Shared relevant information and vaccine access. Discussed that monkeypox vaccines are free and are based on availability of vaccine at local health depts and at our [facility]  ID clinic.      Sexual health & secondary prevention  - chronic, stable  Not in relationship.  LSE end of July 2022  Parts of body used during sex include: mouth and vagina. Does not have anal sex. Gives and receives oral sex. Receptive partner for vaginal sex.   In past 6 months has had no sex and has not had add'l STI screening.  She  uses condoms  She does routinely discuss HIV status with partner(s).  Have not discussed interest in having children.    Lab Results   Component Value Date    RPR Nonreactive 10/30/2021    RPR Nonreactive 04/24/2021     GC/CT NAATs -  Ordered but patient did not leave samples with outpatient lab.  RPR -  04/24/21 RPR NR      Health maintenance  - chronic, stable  PCP: Dr. Prescott Gum with Internal Medicine, seeing patient today after my visit.    Oral health  She does  have a dentist. Last dental exam 09/2020.    Eye health  She does  use corrective lenses. Last eye exam 08/2020. Early glaucoma    Metabolic conditions  Wt Readings from Last 5 Encounters:   10/30/21 (!) 117 kg (258 lb)   10/30/21 (!) 117.3 kg (258 lb 9.6 oz)   04/24/21 (!) 111.9 kg (246 lb 9.6 oz)   02/11/21 (!) 111.8 kg (246 lb 6.4 oz)     Lab Results   Component Value Date    CREATININE 0.84 (H) 10/30/2021    GLUCOSEU Negative 04/24/2021    GLU 70 10/30/2021    A1C 5.8 (H) 10/30/2021    ALT 20 10/30/2021    ALT 27 04/24/2021    ALT 23 02/11/2021     # Kidney health - creatinine today  # Bone health - FRAX score major osteoporotic 2.1%/Hip fracture 0.1%% on this date: 02/13/21  # Diabetes assessment - random glucose today  # NAFLD assessment - monitor over time    Communicable diseases  Lab Results   Component Value Date    HEPAIGG Reactive (A) 04/24/2021    HEPBSAB Reactive (A) 04/24/2021     # TB screening - no longer needed; negative IGRA, low risk 01/04/2019  # Hepatitis screening -  as noted:  (07/21/20) HepB sAg NR, HepC Ab NR, (04/24/21) HepB sAb REACTIVE, HepB cAb NR, HepA REACTIVE  # MMR screening -  01/04/19 Measles immune    Cancer screening  No results found for: PSASCRN, PSA, PAP, FINALDX  # Cervical -  Managed by GYN  # Breast -  Managed by GYN    # Anorectal -  Needs anal pap  # Colorectal -  Defer to PCP  # Liver - no screening indicated  # Lung - screening not indicated    Cardiovascular disease  Lab Results   Component Value Date    CHOL 141 04/24/2021    HDL 54 04/24/2021    LDL 61 04/24/2021    NONHDL 87 04/24/2021    TRIG 096 04/24/2021     # The 10-year ASCVD risk score (Arnett DK, et al., 2019) is: 2.6%  - is not taking aspirin   - is not taking statin  - BP control fair  - never smoker  # AAA screening - no indication for screening    Immunization History   Administered Date(s) Administered    COVID-19 VACC,MRNA(BOOSTER)OR(6-1YR)MODERNA 02/11/2021    COVID-19 VACCINE,MRNA(MODERNA)(PF) 02/13/2020, 03/12/2020    DTaP,  Unspecified Formulation 03/09/1966, 04/08/1966, 05/10/1966, 05/17/1968    Hepatitis B vaccine, pediatric/adolescent dosage, 05/17/2003, 06/18/2003, 11/22/2003    INFLUENZA TIV (TRI) 39MO+ W/ PRESERV (IM) 09/27/2011, 06/07/2012, 07/20/2012, 06/08/2013, 06/03/2015, 08/06/2019    Influenza Vaccine Quad (IIV4 PF) 53mo+ injectable 10/30/2021    Influenza Vaccine Quad (IIV4 W/PRESERV) 39MO+ 09/27/2011, 06/07/2012, 06/08/2013, 06/03/2015, 08/06/2019    Influenza Virus Vaccine, unspecified formulation 09/27/2011, 06/07/2012, 07/20/2012, 06/08/2013, 06/03/2015    Measles 04/12/1969    Pneumococcal Conjugate 20-valent 10/30/2021     Immunizations today -  PCV-20  flu vaccine   CoVid booster declined today    I personally spent *** minutes face-to-face and non-face-to-face in the care of this patient, which includes all pre, intra, and post visit time on the date of service.  All documented time was specific to the E/M visit and does not include any procedures that may have been performed.        Disposition  Next appointment: 4 weeks      To do @ next RTC  Anal pap  Effexor?  CoVid BV booster    Varney Daily, FNP-BC  [facility] at [facility]  [address] [place], [place] [address]    Phone: [phone]   Fax: [phone]           Subjective      Chief Complaint   Routine HIV follow up    HPI  In addition to details in A&P above:  Denies any fever, chills, nausea, vomiting, rash, urinary complaints, diarrhea, constipation.  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  Has been under a lot stress  Has been with boyfreind who is chronically ill and has cancer - Stage 4 lung and renal cancer, COPD, DM  No sex x 1year.  Has Daughter and 2 grandsons living with her, living in 1 bedroom apartment very stressful  Would like to be referred for therapy services.      Past Medical History:   Diagnosis Date    Other specified glaucoma     Urinary tract infection        Social History  Housing - in apartment with roommate  School / Work & Benefits - employed ([employer]), hoping to change jobs.    Social History     Tobacco Use    Smoking status: Never    Smokeless tobacco: Never   Substance Use Topics    Drug use: Never       Review of Systems  As per HPI. All others negative.      Medications and Allergies  She has a current medication list which includes the following prescription(s): amlodipine, genvoya, meloxicam, oxybutynin, triamterene-hydrochlorothiazide, valacyclovir, and venlafaxine.    Allergies: Patient has no known allergies.      Family History  Her family history includes Asthma in her maternal aunt and maternal grandmother; COPD in her mother; Cancer in her maternal aunt, maternal grandmother, and son; Depression in her mother; Diabetes in her brother and father; Heart disease in her maternal aunt and mother; Hypertension in her brother, father, and mother; Kidney disease in her paternal aunt and paternal uncle.              Objective:      There were no vitals taken for this visit.  Wt Readings from Last 3  Encounters:   10/30/21 (!) 117 kg (258 lb)   10/30/21 (!) 117.3 kg (258 lb 9.6 oz)   04/24/21 (!) 111.9 kg (246 lb 9.6 oz)       Const looks well and attentive, alert, appropriate   Eyes sclerae anicteric, noninjected OU   ENT no thrush, leukoplakia or oral lesions   Lymph no cervical or supraclavicular LAD   CV RRR. No murmurs. No rub or gallop. S1/S2.   Lungs CTAB ant/post, normal work of breathing   GI Soft, no organomegaly. NTND. NABS.   GU deferred   Rectal deferred   Skin no petechiae, ecchymoses or obvious rashes on clothed exam   MSK no joint tenderness and normal ROM throughout   Neuro grossly intact   Psych Appropriate affect. Eye contact good. Linear thoughts. Fluent speech.     Laboratory Data  Reviewed in Epic today, using Synopsis and Chart Review filters.    Lab Results   Component Value Date    CREATININE 0.84 (H) 10/30/2021    CHOL 141 04/24/2021    HDL 54 04/24/2021    LDL 61 04/24/2021    NONHDL 87 04/24/2021    TRIG 161 04/24/2021    A1C 5.8 (H) 10/30/2021 alert, appropriate   Eyes sclerae anicteric, noninjected OU   ENT no thrush, leukoplakia or oral lesions   Lymph no cervical or supraclavicular LAD   CV RRR. No murmurs. No rub or gallop. S1/S2.   Lungs CTAB ant/post, normal work of breathing   GI Soft, no organomegaly. NTND. NABS.   GU deferred   Rectal deferred   Skin no petechiae, ecchymoses or obvious rashes on clothed exam   MSK no joint tenderness and normal ROM throughout   Neuro grossly intact   Psych Appropriate affect. Eye contact good. Linear thoughts. Fluent speech.     Laboratory Data  Reviewed in Epic today, using Synopsis and Chart Review filters.    Lab Results   Component Value Date    CREATININE 0.99 (H) 03/02/2022    CHOL 141 04/24/2021    HDL 54 04/24/2021    LDL 61 04/24/2021    NONHDL 87 04/24/2021    TRIG 096 04/24/2021    A1C 5.8 (H) 10/30/2021

## 2022-03-02 ENCOUNTER — Ambulatory Visit: Admit: 2022-03-02 | Discharge: 2022-03-03 | Payer: PRIVATE HEALTH INSURANCE | Attending: Family | Primary: Family

## 2022-03-02 ENCOUNTER — Ambulatory Visit: Admit: 2022-03-02 | Discharge: 2022-03-03 | Payer: PRIVATE HEALTH INSURANCE

## 2022-03-02 DIAGNOSIS — Z0000 Encounter for general adult medical examination without abnormal findings: Principal | ICD-10-CM

## 2022-03-02 DIAGNOSIS — I10 Essential (primary) hypertension: Principal | ICD-10-CM

## 2022-03-02 DIAGNOSIS — B20 Human immunodeficiency virus [HIV] disease: Principal | ICD-10-CM

## 2022-03-02 DIAGNOSIS — Z713 Dietary counseling and surveillance: Principal | ICD-10-CM

## 2022-03-02 DIAGNOSIS — F32A Depression, unspecified depression type: Principal | ICD-10-CM

## 2022-03-02 DIAGNOSIS — Z113 Encounter for screening for infections with a predominantly sexual mode of transmission: Principal | ICD-10-CM

## 2022-03-02 DIAGNOSIS — A6000 Herpesviral infection of urogenital system, unspecified: Principal | ICD-10-CM

## 2022-03-02 LAB — CBC W/ AUTO DIFF
BASOPHILS ABSOLUTE COUNT: 0 10*9/L (ref 0.0–0.1)
BASOPHILS RELATIVE PERCENT: 0.5 %
EOSINOPHILS ABSOLUTE COUNT: 0.1 10*9/L (ref 0.0–0.5)
EOSINOPHILS RELATIVE PERCENT: 1.9 %
HEMATOCRIT: 37.6 % (ref 34.0–44.0)
HEMOGLOBIN: 12.4 g/dL (ref 11.3–14.9)
LYMPHOCYTES ABSOLUTE COUNT: 1.6 10*9/L (ref 1.1–3.6)
LYMPHOCYTES RELATIVE PERCENT: 35.9 %
MEAN CORPUSCULAR HEMOGLOBIN CONC: 33 g/dL (ref 32.0–36.0)
MEAN CORPUSCULAR HEMOGLOBIN: 28.4 pg (ref 25.9–32.4)
MEAN CORPUSCULAR VOLUME: 86.1 fL (ref 77.6–95.7)
MEAN PLATELET VOLUME: 8.3 fL (ref 6.8–10.7)
MONOCYTES ABSOLUTE COUNT: 0.3 10*9/L (ref 0.3–0.8)
MONOCYTES RELATIVE PERCENT: 7 %
NEUTROPHILS ABSOLUTE COUNT: 2.4 10*9/L (ref 1.8–7.8)
NEUTROPHILS RELATIVE PERCENT: 54.7 %
NUCLEATED RED BLOOD CELLS: 0 /100{WBCs} (ref <=4)
PLATELET COUNT: 438 10*9/L (ref 150–450)
RED BLOOD CELL COUNT: 4.37 10*12/L (ref 3.95–5.13)
RED CELL DISTRIBUTION WIDTH: 15.8 % — ABNORMAL HIGH (ref 12.2–15.2)
WBC ADJUSTED: 4.3 10*9/L (ref 3.6–11.2)

## 2022-03-02 LAB — BASIC METABOLIC PANEL
ANION GAP: 9 mmol/L (ref 5–14)
BLOOD UREA NITROGEN: 18 mg/dL (ref 9–23)
BUN / CREAT RATIO: 18
CALCIUM: 9.6 mg/dL (ref 8.7–10.4)
CHLORIDE: 102 mmol/L (ref 98–107)
CO2: 29.1 mmol/L (ref 20.0–31.0)
CREATININE: 0.99 mg/dL — ABNORMAL HIGH
EGFR CKD-EPI (2021) FEMALE: 66 mL/min/{1.73_m2} (ref >=60)
GLUCOSE RANDOM: 82 mg/dL (ref 70–179)
POTASSIUM: 3.3 mmol/L — ABNORMAL LOW (ref 3.4–4.8)
SODIUM: 140 mmol/L (ref 135–145)

## 2022-03-02 LAB — ALT: ALT (SGPT): 27 U/L (ref 10–49)

## 2022-03-02 LAB — AST: AST (SGOT): 27 U/L (ref <=34)

## 2022-03-02 LAB — BILIRUBIN, TOTAL: BILIRUBIN TOTAL: 0.3 mg/dL (ref 0.3–1.2)

## 2022-03-02 MED ORDER — VALACYCLOVIR 1 GRAM TABLET
ORAL_TABLET | Freq: Every day | ORAL | 11 refills | 30 days | Status: CP
Start: 2022-03-02 — End: ?
  Filled 2022-03-01: qty 30, 30d supply, fill #3
  Filled 2022-04-05: qty 30, 30d supply, fill #0

## 2022-03-02 MED ORDER — GENVOYA 150 MG-150 MG-200 MG-10 MG TABLET
ORAL_TABLET | Freq: Every day | ORAL | 11 refills | 30 days | Status: CP
Start: 2022-03-02 — End: ?
  Filled 2022-03-01: qty 30, 30d supply, fill #3
  Filled 2022-04-05: qty 30, 30d supply, fill #0

## 2022-03-02 MED ORDER — VENLAFAXINE ER 150 MG CAPSULE,EXTENDED RELEASE 24 HR
ORAL_CAPSULE | Freq: Every day | ORAL | 3 refills | 90 days | Status: CP
Start: 2022-03-02 — End: ?
  Filled 2022-03-01: qty 30, 30d supply, fill #3
  Filled 2022-04-05: qty 30, 30d supply, fill #0

## 2022-03-02 MED ORDER — AMLODIPINE 5 MG TABLET
ORAL_TABLET | Freq: Every day | ORAL | 3 refills | 90 days | Status: CP
Start: 2022-03-02 — End: ?
  Filled 2022-03-01: qty 15, 30d supply, fill #3
  Filled 2022-04-05: qty 15, 30d supply, fill #0

## 2022-03-02 MED ORDER — TRIAMTERENE 37.5 MG-HYDROCHLOROTHIAZIDE 25 MG TABLET
ORAL_TABLET | Freq: Every day | ORAL | 3 refills | 90 days | Status: CN
Start: 2022-03-02 — End: ?

## 2022-03-02 NOTE — Unmapped (Signed)
Karen Strickland, DMSc, PA-C    Assessment/Plan:     Lab Results   Component Value Date    A1C 5.8 (H) 10/30/2021    A1C 6.0 (H) 04/24/2021       1.  HIV-followed by ID, appreciate assistance.  2.  Karen Strickland is currently working on lifestyle modifications and I have encouraged her to continue.  A1c from February was stable at 5.8.  2.  Depression/anxiety-no acute change.  3.  Hypertension-reports she knows she takes amlodipine 2.5 mg daily however she is unsure about triamterene/hydrochlorothiazide pill.  This is listed as 1/2 tablet.  Her current blood pressure is well controlled and I have encouraged her to bring all meds to next visit.  If she is currently not taking Maxide, I have recommended that she continue to refrain from taking this medication until next PCP follow-up.  4.  Health maintenance-reports she will plans to get a mammogram locally in [place] and she will send the report.  She has completed Shingrix and she is up-to-date with Tdap per her report.    F/u: 08/03/2022, same day as ID    Subject/Objective:     Chief Complaint   Patient presents with    Follow-up     Follow up     S: 56 y.o. year old female with PMHx significant for   Past Medical History:   Diagnosis Date    Other specified glaucoma     Urinary tract infection    .     There is no problem list on file for this patient.      Presents for routine follow-up, has ID follow-up today and travels from [place].  No complaints today.  Reports she is working on weight loss and has lost a few pounds.       Your Medication List            Accurate as of March 02, 2022  2:35 PM. If you have any questions, ask your nurse or doctor.                STOP taking these medications      meloxicam 7.5 MG tablet  Commonly known as: MOBIC  Stopped by: Mariana Single, FNP     triamterene-hydrochlorothiazide 37.5-25 mg per tablet  Commonly known as: MAXZIDE-25  Stopped by: Dwana Melena, PA            CONTINUE taking these medications      amLODIPine 5 MG tablet  Commonly known as: NORVASC  Take 0.5 tablets (2.5 mg total) by mouth daily.     GENVOYA 150-150-200-10 mg Tab tablet  Generic drug: elvitegravir-cobicistat-emtricitabine-tenofovir  Take 1 tablet by mouth daily.     oxybutynin 10 MG 24 hr tablet  Commonly known as: DITROPAN-XL  Take 1 tablet (10 mg total) by mouth daily.     valACYclovir 1000 MG tablet  Commonly known as: VALTREX  Take 1 tablet (1,000 mg total) by mouth daily.     venlafaxine 150 MG 24 hr capsule  Commonly known as: EFFEXOR-XR  Take 1 capsule (150 mg total) by mouth daily.              No Known Allergies     Medication adherence and barriers to the treatment plan have been addressed. Opportunities to optimize healthy behaviors have been discussed. Patient / caregiver voiced understanding.      ROS: negative for vision changes, CP, abdominal pain, bowel/bladder changes, bleeding, lower extremity  edema    O:  Vital Signs:    Vitals:    03/02/22 1415   BP: 117/82   Pulse: 94   Temp: 36.8 ??C (98.3 ??F)   TempSrc: Temporal   SpO2: 94%   Weight: (!) 115.7 kg (255 lb)   Height: 172.7 cm (5' 8)       Physical Exam  General Appearance:    Alert, cooperative, no distress, appears stated age   Head:    Normocephalic, without obvious abnormality, atraumatic   Eyes:     Ears:     Throat:   Lips, mucosa, and tongue normal   Neck:   Supple, symmetrical, trachea midline,      No JVD bilaterally.   Lungs:     Clear to auscultation bilaterally, respirations unlabored   Chest Wall:    No tenderness or deformity    Heart:    Regular rate and rhythm, S1 and S2 normal, no murmur, rub    or gallop   Abdomen:     Soft, non-tender       Extremities:   Extremities normal, atraumatic, no cyanosis or edema   Psych :  No agitation, anxiety, or depressed mood.

## 2022-03-02 NOTE — Unmapped (Signed)
[facility]     Reason for visit: follow up    Questions / Concerns that need to be addressed:             Hypertension:  Have blood pressure cuff at home?: no  Regularly checking blood pressure?: no  If patient has a  record of recent blood pressures, please enter into flowsheets using this link [email or web address]                Allergies reviewed: Yes    Medication reviewed: Yes  Pended refills? Yes        HCDM reviewed and updated in Epic:    We are working to make sure all of our patients??? wishes are updated in Epic and part of that is documenting a Environmental health practitioner for each patient  A Health Care Decision Maker is someone you choose who can make health care decisions for you if you are not able - who would you most want to do this for you????  is already up to date.        BPAs completed:        COVID-19 Vaccine Summary  Which COVID-19 Vaccine was administered  Type:  Complete  Dates Given:  03/02/2022                   If no: Are you interested in scheduling?     Immunization History   Administered Date(s) Administered    COVID-19 VAC,BIVALENT,MODERNA(BLUE CAP) 03/02/2022    COVID-19 VACC,MRNA(BOOSTER)OR(6-77YR)MODERNA 02/11/2021    DTaP, Unspecified Formulation 03/09/1966, 04/08/1966, 05/10/1966, 05/17/1968    Hepatitis B vaccine, pediatric/adolescent dosage, 05/17/2003, 06/18/2003, 11/22/2003    INFLUENZA TIV (TRI) 39MO+ W/ PRESERV (IM) 09/27/2011, 06/07/2012, 07/20/2012, 06/08/2013, 06/03/2015, 08/06/2019    Influenza Vaccine Quad (IIV4 PF) 16mo+ injectable 10/30/2021    Influenza Vaccine Quad (IIV4 W/PRESERV) 39MO+ 09/27/2011, 06/07/2012, 06/08/2013, 06/03/2015, 08/06/2019    Influenza Virus Vaccine, unspecified formulation 09/27/2011, 06/07/2012, 07/20/2012, 06/08/2013, 06/03/2015    Measles 04/12/1969    Pneumococcal Conjugate 20-valent 10/30/2021       __________________________________________________________________________________________    SCREENINGS COMPLETED IN FLOWSHEETS    HARK Screening       AUDIT       PHQ2       PHQ9          P4 Suicidality Screener                GAD7       COPD Assessment       Falls Risk       .imcres

## 2022-03-02 NOTE — Unmapped (Signed)
PROMIS Tablet Screening  Completed Date: 03/02/2022     SW reviewed self-administered screening.    Patient had a PHQ-9 score of 0  indicating None-Minimal depression.   Pt denies SI.   Pt scored 1 on AUDIT/AUDIT-C indicating Not-at-risk alcohol consumption  Pt  denies substance use in past 3 months.  Pt denies concerns for IPV.    Pt seen by provider for regular ID visit.  Pt was not seen in person by Social Work during this visit.     Sofie Rower, LCSW-A  [facility] Social Work

## 2022-03-03 LAB — HIV RNA, QUANTITATIVE, PCR
HIV RNA LOG10: 1.46 {Log_copies}/mL — ABNORMAL HIGH (ref <0.00)
HIV RNA QNT RSLT: DETECTED — AB
HIV RNA: 29 {copies}/mL — ABNORMAL HIGH (ref <0)

## 2022-03-03 LAB — SYPHILIS SCREEN: SYPHILIS RPR SCREEN: NONREACTIVE

## 2022-03-04 NOTE — Unmapped (Signed)
V[employer] Cabenuva Application Submitted     An electronic application for benefits assessment has been submitted through [employer], the manufacturer for [employer]. Will follow up with results and next steps when available.     [employer]: [phone]        Danton Clap, PharmD, BCIDP, CPP  Clinical Pharmacist Practitioner - HIV/Infectious Diseases    INFECTIOUS DISEASES CLINIC  [address]  [place], [place]  [address]  P [phone]  F [phone]

## 2022-03-31 NOTE — Unmapped (Signed)
Colonoscopy found in care everywhere.

## 2022-04-01 NOTE — Unmapped (Signed)
[facility] Refill Coordination Note    Specialty Medication(s) to be Shipped:   Infectious Disease: Genvoya    Other medication(s) to be shipped: amlodipine  Valacyclovir  Venlafaxine       Lillia Corporal, DOB: December 14, 1965  Phone: [phone] (home)       All above HIPAA information was verified with patient.     Was a Nurse, learning disability used for this call? No    Completed refill call assessment today to schedule patient's medication shipment from the [facility] [phone]).  All relevant notes have been reviewed.     Specialty medication(s) and dose(s) confirmed: Regimen is correct and unchanged.   Changes to medications: Tywanda reports no changes at this time.  Changes to insurance: No  New side effects reported not previously addressed with a pharmacist or physician: None reported  Questions for the pharmacist: Yes: patient reports the office was working on having the patient start injections for her ID regimen. Patient wants to know if we have an update    Confirmed patient received a Conservation officer, historic buildings and a Surveyor, mining with first shipment. The patient will receive a drug information handout for each medication shipped and additional FDA Medication Guides as required.       DISEASE/MEDICATION-SPECIFIC INFORMATION        N/A    SPECIALTY MEDICATION ADHERENCE     Medication Adherence    Patient reported X missed doses in the last month: 0  Specialty Medication: genvoya              Were doses missed due to medication being on hold? No    Genvoya  : 7 days of medicine on hand       REFERRAL TO PHARMACIST     Referral to the pharmacist: Yes - patient request      SHIPPING     Shipping address confirmed in Epic.     Delivery Scheduled: Yes, Expected medication delivery date: 7/18.     Medication will be delivered via UPS to the prescription address in Epic WAM.    Westley Gambles   [facility] Specialty Technician

## 2022-04-30 NOTE — Unmapped (Signed)
[facility] Refill Coordination Note    Specialty Medication(s) to be Shipped:   Infectious Disease: Genvoya    Other medication(s) to be shipped:  amlodipine, valacyclovir, venlafaxine     Karen Strickland, DOB: [birth year]  Phone: [phone] (home)       All above HIPAA information was verified with patient.     Was a Nurse, learning disability used for this call? No    Completed refill call assessment today to schedule patient's medication shipment from the [facility] [phone]).  All relevant notes have been reviewed.     Specialty medication(s) and dose(s) confirmed: Regimen is correct and unchanged.   Changes to medications: [name] reports no changes at this time.  Changes to insurance: No  New side effects reported not previously addressed with a pharmacist or physician: None reported  Questions for the pharmacist: No    Confirmed patient received a Conservation officer, historic buildings and a Surveyor, mining with first shipment. The patient will receive a drug information handout for each medication shipped and additional FDA Medication Guides as required.       DISEASE/MEDICATION-SPECIFIC INFORMATION        N/A    SPECIALTY MEDICATION ADHERENCE     Medication Adherence    Patient reported X missed doses in the last month: 0  Specialty Medication: GENVOYA 150-150-200-10 mg  Patient is on additional specialty medications: No                                Were doses missed due to medication being on hold? No    Genvoya 150-150-200-10 mg: 5-6 days of medicine on hand        REFERRAL TO PHARMACIST     Referral to the pharmacist: Not needed      [facility]     Shipping address confirmed in Epic.     Delivery Scheduled: Yes, Expected medication delivery date: 05/04/22.     Medication will be delivered via UPS to the prescription address in Epic WAM.    Unk Lightning   [facility] Specialty Technician

## 2022-05-03 MED FILL — GENVOYA 150 MG-150 MG-200 MG-10 MG TABLET: ORAL | 30 days supply | Qty: 30 | Fill #1

## 2022-05-03 MED FILL — VENLAFAXINE ER 150 MG CAPSULE,EXTENDED RELEASE 24 HR: ORAL | 30 days supply | Qty: 30 | Fill #1

## 2022-05-03 MED FILL — AMLODIPINE 5 MG TABLET: ORAL | 30 days supply | Qty: 15 | Fill #1

## 2022-05-03 MED FILL — VALACYCLOVIR 1 GRAM TABLET: ORAL | 30 days supply | Qty: 30 | Fill #1

## 2022-05-28 NOTE — Unmapped (Signed)
[facility] Refill Coordination Note    Specialty Medication(s) to be Shipped:   Infectious Disease: Genvoya    Other medication(s) to be shipped:  amlodipine , valacyclovir   and venlafaxine      Karen Strickland, DOB: 10/01/1965  Phone: [phone] (home)       All above HIPAA information was verified with patient.     Was a Nurse, learning disability used for this call? No    Completed refill call assessment today to schedule patient's medication shipment from the [facility] [phone]).  All relevant notes have been reviewed.     Specialty medication(s) and dose(s) confirmed: Regimen is correct and unchanged.   Changes to medications: Nubian reports no changes at this time.  Changes to insurance: No  New side effects reported not previously addressed with a pharmacist or physician: None reported  Questions for the pharmacist: No    Confirmed patient received a Conservation officer, historic buildings and a Surveyor, mining with first shipment. The patient will receive a drug information handout for each medication shipped and additional FDA Medication Guides as required.       DISEASE/MEDICATION-SPECIFIC INFORMATION        N/A    SPECIALTY MEDICATION ADHERENCE     Medication Adherence    Patient reported X missed doses in the last month: 0  Specialty Medication: genvoya 150-150-200-10mg   Patient is on additional specialty medications: No                                Were doses missed due to medication being on hold? No    Genvoya 150-150-200-10 mg: 8 days of medicine on hand       REFERRAL TO PHARMACIST     Referral to the pharmacist: Not needed      [facility]     Shipping address confirmed in Epic.     Delivery Scheduled: Yes, Expected medication delivery date: 06/02/22.     Medication will be delivered via UPS to the temporary address in Epic WAM.    Karen Strickland   [name] Specialty Technician

## 2022-06-01 MED FILL — AMLODIPINE 5 MG TABLET: ORAL | 30 days supply | Qty: 15 | Fill #2

## 2022-06-01 MED FILL — VENLAFAXINE ER 150 MG CAPSULE,EXTENDED RELEASE 24 HR: ORAL | 30 days supply | Qty: 30 | Fill #2

## 2022-06-01 MED FILL — VALACYCLOVIR 1 GRAM TABLET: ORAL | 30 days supply | Qty: 30 | Fill #2

## 2022-06-01 MED FILL — GENVOYA 150 MG-150 MG-200 MG-10 MG TABLET: ORAL | 30 days supply | Qty: 30 | Fill #2

## 2022-07-06 MED FILL — VALACYCLOVIR 1 GRAM TABLET: ORAL | 30 days supply | Qty: 30 | Fill #3

## 2022-07-06 MED FILL — AMLODIPINE 5 MG TABLET: ORAL | 30 days supply | Qty: 15 | Fill #3

## 2022-07-06 MED FILL — VENLAFAXINE ER 150 MG CAPSULE,EXTENDED RELEASE 24 HR: ORAL | 30 days supply | Qty: 30 | Fill #3

## 2022-07-06 MED FILL — GENVOYA 150 MG-150 MG-200 MG-10 MG TABLET: ORAL | 30 days supply | Qty: 30 | Fill #3

## 2022-07-06 NOTE — Unmapped (Signed)
[facility] Refill Coordination Note    Specialty Medication(s) to be Shipped:   Infectious Disease: Genvoya    Other medication(s) to be shipped:  amlodipine, valacyclovir, venlafaxine     Karen Strickland, DOB: Apr 14, 1966  Phone: [phone] (home)       All above HIPAA information was verified with patient.     Was a Nurse, learning disability used for this call? No    Completed refill call assessment today to schedule patient's medication shipment from the [facility] [phone]).  All relevant notes have been reviewed.     Specialty medication(s) and dose(s) confirmed: Regimen is correct and unchanged.   Changes to medications: Karen Strickland reports no changes at this time.  Changes to insurance: No  New side effects reported not previously addressed with a pharmacist or physician: None reported  Questions for the pharmacist: No    Confirmed patient received a Conservation officer, historic buildings and a Surveyor, mining with first shipment. The patient will receive a drug information handout for each medication shipped and additional FDA Medication Guides as required.       DISEASE/MEDICATION-SPECIFIC INFORMATION        N/A    SPECIALTY MEDICATION ADHERENCE     Medication Adherence    Patient reported X missed doses in the last month: 0  Specialty Medication: Genvoya  Patient is on additional specialty medications: No  Any gaps in refill history greater than 2 weeks in the last 3 months: no  Demonstrates understanding of importance of adherence: yes  Informant: patient  Reliability of informant: reliable              Confirmed plan for next specialty medication refill: delivery by pharmacy  Refills needed for supportive medications: not needed              Were doses missed due to medication being on hold? No    Genvoya 150-150-200-10 mg: 5-6 days of medicine on hand        REFERRAL TO PHARMACIST     Referral to the pharmacist: Not needed      [facility]     Shipping address confirmed in Epic. Delivery Scheduled: Yes, Expected medication delivery date: 10/18.     Medication will be delivered via UPS to the prescription address in Epic WAM.    Karen Strickland   [facility] Specialty Technician

## 2022-07-30 NOTE — Unmapped (Signed)
[facility] Refill Coordination Note    Specialty Medication(s) to be Shipped:   Infectious Disease: Genvoya    Other medication(s) to be shipped:  amlodipine, valacyclovir, venlafaxine     Karen Strickland, DOB: 03-10-1966  Phone: [phone] (home)       All above HIPAA information was verified with patient.     Was a Nurse, learning disability used for this call? No    Completed refill call assessment today to schedule patient's medication shipment from the [facility] [phone]).  All relevant notes have been reviewed.     Specialty medication(s) and dose(s) confirmed: Regimen is correct and unchanged.   Changes to medications: [name] reports no changes at this time.  Changes to insurance: No  New side effects reported not previously addressed with a pharmacist or physician: None reported  Questions for the pharmacist: No    Confirmed patient received a Conservation officer, historic buildings and a Surveyor, mining with first shipment. The patient will receive a drug information handout for each medication shipped and additional FDA Medication Guides as required.       DISEASE/MEDICATION-SPECIFIC INFORMATION        N/A    SPECIALTY MEDICATION ADHERENCE     Medication Adherence    Patient reported X missed doses in the last month: 0  Specialty Medication: GENVOYA 150-150-200-10 mg  Patient is on additional specialty medications: No  Patient is on more than two specialty medications: No  Any gaps in refill history greater than 2 weeks in the last 3 months: no                          Were doses missed due to medication being on hold? No    Genvoya 150-150-200-10 mg: 5  days of medicine on hand        REFERRAL TO PHARMACIST     Referral to the pharmacist: Not needed      [facility]     Shipping address confirmed in Epic.     Delivery Scheduled: Yes, Expected medication delivery date: 08/03/22 .     Medication will be delivered via UPS to the prescription address in Epic WAM.    Ricci Barker   [facility] Specialty Technician

## 2022-08-02 MED FILL — VALACYCLOVIR 1 GRAM TABLET: ORAL | 30 days supply | Qty: 30 | Fill #4

## 2022-08-02 MED FILL — GENVOYA 150 MG-150 MG-200 MG-10 MG TABLET: ORAL | 30 days supply | Qty: 30 | Fill #4

## 2022-08-02 MED FILL — AMLODIPINE 5 MG TABLET: ORAL | 30 days supply | Qty: 15 | Fill #4

## 2022-08-02 MED FILL — VENLAFAXINE ER 150 MG CAPSULE,EXTENDED RELEASE 24 HR: ORAL | 30 days supply | Qty: 30 | Fill #4

## 2022-08-30 NOTE — Unmapped (Signed)
[facility] Refill Coordination Note    Specialty Medication(s) to be Shipped:   Infectious Disease: Genvoya    Other medication(s) to be shipped:  amlodipine, valacyclovir, venlafaxine     Karen Strickland, DOB: [birth year]  Phone: [phone] (home)       All above HIPAA information was verified with patient.     Was a Nurse, learning disability used for this call? No    Completed refill call assessment today to schedule patient's medication shipment from the [facility] [phone]).  All relevant notes have been reviewed.     Specialty medication(s) and dose(s) confirmed: Regimen is correct and unchanged.   Changes to medications: Karen Strickland reports no changes at this time.  Changes to insurance: No  New side effects reported not previously addressed with a pharmacist or physician: None reported  Questions for the pharmacist: No    Confirmed patient received a Conservation officer, historic buildings and a Surveyor, mining with first shipment. The patient will receive a drug information handout for each medication shipped and additional FDA Medication Guides as required.       DISEASE/MEDICATION-SPECIFIC INFORMATION        N/A    SPECIALTY MEDICATION ADHERENCE     Medication Adherence    Patient reported X missed doses in the last month: 0  Specialty Medication: genvoya 150-150-200-10mg   Patient is on additional specialty medications: No  Patient is on more than two specialty medications: No  Demonstrates understanding of importance of adherence: yes                          Were doses missed due to medication being on hold? No    Genvoya 150-150-200-10 mg: 4-5  days of medicine on hand        REFERRAL TO PHARMACIST     Referral to the pharmacist: Not needed      [facility]     Shipping address confirmed in Epic.     Delivery Scheduled: Yes, Expected medication delivery date: 09/01/22 .     Medication will be delivered via UPS to the prescription address in Epic WAM.    Karen Strickland   [facility] Specialty Technician

## 2022-09-01 MED FILL — GENVOYA 150 MG-150 MG-200 MG-10 MG TABLET: ORAL | 30 days supply | Qty: 30 | Fill #5

## 2022-09-01 MED FILL — VALACYCLOVIR 1 GRAM TABLET: ORAL | 30 days supply | Qty: 30 | Fill #5

## 2022-09-01 MED FILL — VENLAFAXINE ER 150 MG CAPSULE,EXTENDED RELEASE 24 HR: ORAL | 30 days supply | Qty: 30 | Fill #5

## 2022-09-01 MED FILL — AMLODIPINE 5 MG TABLET: ORAL | 30 days supply | Qty: 15 | Fill #5

## 2022-09-14 NOTE — Unmapped (Unsigned)
INFECTIOUS DISEASES CLINIC  [address]  [place], [place]  [address]  P [phone]  F [phone]     Primary care provider: Julieanne Cotton, PA      Assessment/Plan:      HIV (dx'd 2004)  - chronic, stable  Health Department came to her home and told she needed testing.  Participated in study that started her on Edurant(RPV) and Truvada(FTC/TDF)-equivalent to Complera  Started on Odefsy which is covered on current insurance plan in 01/2021. (Patient's insurance did not cover for Complera. Odefsy is equivalent, but has TAF rather than TDF. Patient amenable to starting this.)    Overall doing well. Current regimen: Genvoya (EVG/c/FTC/TAF) Was lost to follow up and has not had ART.  Misses doses of ARVs never     Med access through insurance  CD4 count  see below  Discussed ARV adherence and taking ARVs with food    Lab Results   Component Value Date    ACD4 560 10/30/2021    CD4 40 10/30/2021    HIVRS Detected (A) 03/02/2022    HIVCP 29 (H) 03/02/2022     CD4, HIV RNA, and safety labs (full return panel)  Continue current therapy. Started on [place] on 11/19/21  Discussed importance of ARV adherence  Very interested in [place]. Discussed benefits/risks of treatment with injectable modality. Patient would really like to start this. Could also have future Cabenuva injections at local [facility] infusion clinic.  Until patient can get on Cabenuva, will continue Genvoya. Had thought to start on Juluca but is not approved for this indication since patient not undetectable for 6 months.  Patient lives in [place] but is willing to drive to [facility] every 2 months for injections. Does not want to have subsequent injections at [facility] for now.    Cabenuva Discussion  During the visit today, I discussed with the patient the risks and benefits of Cabenuva treatment and the schedule of injections. she have verbalized understanding of this discussion and would like to pursue treatment every two months.  Current ART regimen: Genvoya  I confirm that the patient fulfills the eligibility requirements below:  HIV positive: yes  Suppressed viral load for at least 3 months: No  Last viral load on pending from 6/13  Prior tolerance to INSTI and NNRTI: Yes  No prior virologic failures (no prior INSTI or NNRTI resistance):  Yes. Current Genotype from 02/2021  Do not have active hepatitis B virus infection, unless also receiving an oral HBV active regimen:  No. She is Hepatitis B immune  Are not pregnant or planning to become pregnant:  Yes  Agrees to be seen for monthly or bimonthly injections:  Yes  Patient education includes:  Importance of consistently attending injection appointments and follow up appointments  Having reliable transportation for appointments   Having reliable way to communicate with clinic and for clinic staff to be able to contact patient if need be  Willingness to receive one moderate volume injection in each gluteal each treatment visit  Discussed any upcoming travel or consistent travel (as for work, etc.)  Discussed any anticipated changes to insurance/coverage plan.  Agrees to sign texting agreement and enroll in MyChart in order to facilitate communication and appointment reminders.  Opting for oral lead-in?  No  Has been on LAI treatment in the past?  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  Patient also has oral lesions,  denies any genital lesions.  On suppression. RX sent for Valtrex 1 gram daily.      Depression  -  acute, exacerbated  Not currently on therapy  Patient has had two traumatic losses in her life (two family members killed within the same week) and has had financial difficulties that has been quite stressful  Continues to experience stressful events. Her boyfriend has serious medical issues, daughter and grandsons living with her in 1-bedroom apartment, having work stress.  Had been started on Effexor, but ran out.      BMI 38.77 - chronic  She is an emotional eater, very stressful year.  Will need to keep in mind, weight neutral ART regimen if needing to change ART      History of CoVid infection (02/2019) - acute, self-limited  Symptoms included: Fever, body aches. Denies loss of taste or smell, coughing, GI complaints  Symptoms resolved.  CoVid BV booster today.       Hypertension  - chronic, improving  117/82 today  Continue BP med. Seeing PCP today.    Monkeypox Education  Discussed current outbreak, risk factors, lesion recognition, and clinical signs/symptoms including fever, headache, myalgias, and fatigue followed a few days later by a rash. Discussed varied appearance of rash including stages of evolution.   Shared relevant information and vaccine access. Discussed that monkeypox vaccines are free and are based on availability of vaccine at local health depts and at our [facility] ID clinic.      Sexual health & secondary prevention  - chronic, stable  Not in relationship.  LSE end of July 2022  Parts of body used during sex include: mouth and vagina. Does not have anal sex. Gives and receives oral sex. Receptive partner for vaginal sex.   In past 6 months has had no sex and has not had add'l STI screening.  She  uses condoms  She does routinely discuss HIV status with partner(s).  Have not discussed interest in having children.    Lab Results   Component Value Date    RPR Nonreactive 03/02/2022    RPR Nonreactive 10/30/2021     GC/CT NAATs - patient declined screening  RPR - repeat 12 months after prior, due 10/2022      Health maintenance  - chronic, stable  PCP: Dr. Prescott Gum with Internal Medicine, she has left. New PCP is Julieanne Cotton, PA-C    Oral health  She does  have a dentist. Last dental exam 09/2020.    Eye health  She does  use corrective lenses. Last eye exam 08/2020. Early glaucoma    Metabolic conditions  Wt Readings from Last 5 Encounters:   03/02/22 (!) 115.7 kg (255 lb)   03/02/22 (!) 116 kg (255 lb 12.8 oz)   10/30/21 (!) 117 kg (258 lb)   10/30/21 (!) 117.3 kg (258 lb 9.6 oz)   04/24/21 (!) 111.9 kg (246 lb 9.6 oz)     Lab Results   Component Value Date    CREATININE 0.99 (H) 03/02/2022    GLUCOSEU Negative 04/24/2021    GLU 82 03/02/2022    A1C 5.8 (H) 10/30/2021    ALT 27 03/02/2022    ALT 20 10/30/2021    ALT 27 04/24/2021     # Kidney health - creatinine today  # Bone health - FRAX score major osteoporotic 2.1%/Hip fracture 0.1%% on this date: 02/13/21  # Diabetes assessment - random glucose today  # NAFLD assessment - monitor over time  Communicable diseases  Lab Results   Component Value Date    HEPAIGG Reactive (A) 04/24/2021    HEPBSAB Reactive (A) 04/24/2021     # TB screening - no longer needed; negative IGRA, low risk 01/04/2019  # Hepatitis screening -  as noted:  (07/21/20) HepB sAg NR, HepC Ab NR, (04/24/21) HepB sAb REACTIVE, HepB cAb NR, HepA REACTIVE Hep A/B IMMUNE  # MMR screening -  01/04/19 Measles immune    Cancer screening  No results found for: PSASCRN, PSA, PAP, FINALDX  # Cervical -  Managed by GYN  # Breast -  Managed by GYN    # Anorectal -  Needs anal pap , declines today  # Colorectal -  Defer to PCP  # Liver - no screening indicated  # Lung - screening not indicated    Cardiovascular disease  Lab Results   Component Value Date    CHOL 141 04/24/2021    HDL 54 04/24/2021    LDL 61 04/24/2021    NONHDL 87 04/24/2021    TRIG 161 04/24/2021     # The 10-year ASCVD risk score (Arnett DK, et al., 2019) is: 2.2%  - is not taking aspirin   - is not taking statin  - BP control fair  - never smoker  # AAA screening - no indication for screening    Immunization History   Administered Date(s) Administered    COVID-19 VAC,BIVALENT,MODERNA(BLUE CAP) 03/02/2022    COVID-19 VACC,MRNA(BOOSTER)OR(6-57YR)MODERNA 02/11/2021    DTaP, Unspecified Formulation 03/09/1966, 04/08/1966, 05/10/1966, 05/17/1968    Hepatitis B vaccine, pediatric/adolescent dosage, 05/17/2003, 06/18/2003, 11/22/2003    INFLUENZA TIV (TRI) 13MO+ W/ PRESERV (IM) 09/27/2011, 06/07/2012, 07/20/2012, 06/08/2013, 06/03/2015, 08/06/2019    Influenza Vaccine Quad (IIV4 W/PRESERV) 13MO+ 09/27/2011, 06/07/2012, 06/08/2013, 06/03/2015, 08/06/2019    Influenza Vaccine Quad(IM)6 MO-Adult(PF) 10/30/2021    Influenza Virus Vaccine, unspecified formulation 09/27/2011, 06/07/2012, 07/20/2012, 06/08/2013, 06/03/2015    Measles 04/12/1969    Pneumococcal Conjugate 20-valent 10/30/2021     Immunizations today - COVID BV Moderna   Flu, CoVid booster      I personally spent 45 minutes face-to-face and non-face-to-face in the care of this patient, which includes all pre, intra, and post visit time on the date of service.  All documented time was specific to the E/M visit and does not include any procedures that may have been performed.        Disposition  Next appointment: 5-6 months      To do @ next RTC  Anal pap - next visit      Varney Daily, FNP-BC  [facility] at [facility]  [address] [place], [place] [address]    Phone: [phone]   Fax: [phone]           Subjective      Chief Complaint   Routine HIV follow up    HPI  In addition to details in A&P above:  Denies any fever, chills, nausea, vomiting, rash, urinary complaints, diarrhea, constipation.  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  Has been under a lot stress  Has been with boyfreind who is chronically ill and has cancer - Stage 4 lung and renal cancer, COPD, DM  No sex x 1year.  Has Daughter and 2 grandsons living with her, living  in 1 bedroom apartment very stressful  Would like to be referred for therapy services.    Interim History 03/02/22:  Working with dietician (local), stress eating., emotional eater.  May be interested in Weight management clinic if she can have virtual visits  Just had a breakup this week or last week. Dealing with it well though it has made her lose her faith in men.  Daughter and 2 grandchildren continues to be a source of stress.  Has been working as a Midwife for [employer]  Has been trying to get away from her kids but they keep following her.  Waiting on getting a therapist till she moves back to [place]. She does not want to see anyone before that.  Decreased sex drive so not interested in sex but wants companionship, hard to find someone who shares this sentiment.  Has early glaucoma, plans to see eye doctor.    Past Medical History:   Diagnosis Date    Other specified glaucoma     Urinary tract infection        Social History  Housing - in apartment with roommate  School / Work & Benefits - employed [employer]), hoping to change jobs.    Social History     Tobacco Use    Smoking status: Never    Smokeless tobacco: Never   Substance Use Topics    Drug use: Never       Review of Systems  As per HPI. All others negative.      Medications and Allergies  She has a current medication list which includes the following prescription(s): amlodipine, genvoya, oxybutynin, valacyclovir, and venlafaxine.    Allergies: Patient has no known allergies.      Family History  Her family history includes Asthma in her maternal aunt and maternal grandmother; COPD in her mother; Cancer in her maternal aunt, maternal grandmother, and son; Depression in her mother; Diabetes in her brother and father; Heart disease in her maternal aunt and mother; Hypertension in her brother, father, and mother; Kidney disease in her paternal aunt and paternal uncle.              Objective:      There were no vitals taken for this visit.  Wt Readings from Last 3 Encounters:   03/02/22 (!) 115.7 kg (255 lb)   03/02/22 (!) 116 kg (255 lb 12.8 oz)   10/30/21 (!) 117 kg (258 lb)       Const looks well and attentive, alert, appropriate   Eyes sclerae anicteric, noninjected OU   ENT no thrush, leukoplakia or oral lesions   Lymph no cervical or supraclavicular LAD   CV RRR. No murmurs. No rub or gallop. S1/S2.   Lungs CTAB ant/post, normal work of breathing   GI Soft, no organomegaly. NTND. NABS.   GU deferred   Rectal deferred   Skin no petechiae, ecchymoses or obvious rashes on clothed exam   MSK no joint tenderness and normal ROM throughout   Neuro grossly intact   Psych Appropriate affect. Eye contact good. Linear thoughts. Fluent speech.     Laboratory Data  Reviewed in Epic today, using Synopsis and Chart Review filters.    Lab Results   Component Value Date    CREATININE 0.99 (H) 03/02/2022    CHOL 141 04/24/2021    HDL 54 04/24/2021    LDL 61 04/24/2021    NONHDL 87 04/24/2021    TRIG 161 04/24/2021  A1C 5.8 (H) 10/30/2021

## 2022-09-15 ENCOUNTER — Ambulatory Visit: Admit: 2022-09-15 | Discharge: 2022-09-16 | Payer: PRIVATE HEALTH INSURANCE | Attending: Family | Primary: Family

## 2022-09-15 DIAGNOSIS — B20 Human immunodeficiency virus [HIV] disease: Principal | ICD-10-CM

## 2022-09-15 DIAGNOSIS — A6000 Herpesviral infection of urogenital system, unspecified: Principal | ICD-10-CM

## 2022-09-15 DIAGNOSIS — Z9189 Other specified personal risk factors, not elsewhere classified: Principal | ICD-10-CM

## 2022-09-15 DIAGNOSIS — I10 Essential (primary) hypertension: Principal | ICD-10-CM

## 2022-09-15 DIAGNOSIS — N3281 Overactive bladder: Principal | ICD-10-CM

## 2022-09-15 DIAGNOSIS — F32A Depression, unspecified depression type: Principal | ICD-10-CM

## 2022-09-15 DIAGNOSIS — Z5181 Encounter for therapeutic drug level monitoring: Principal | ICD-10-CM

## 2022-09-15 DIAGNOSIS — Z113 Encounter for screening for infections with a predominantly sexual mode of transmission: Principal | ICD-10-CM

## 2022-09-15 DIAGNOSIS — Z79899 Other long term (current) drug therapy: Principal | ICD-10-CM

## 2022-09-15 DIAGNOSIS — Z23 Encounter for immunization: Principal | ICD-10-CM

## 2022-09-15 LAB — BILIRUBIN, TOTAL: BILIRUBIN TOTAL: 0.3 mg/dL (ref 0.3–1.2)

## 2022-09-15 LAB — CBC W/ AUTO DIFF
BASOPHILS ABSOLUTE COUNT: 0 10*9/L (ref 0.0–0.1)
BASOPHILS RELATIVE PERCENT: 0.8 %
EOSINOPHILS ABSOLUTE COUNT: 0.1 10*9/L (ref 0.0–0.5)
EOSINOPHILS RELATIVE PERCENT: 1.8 %
HEMATOCRIT: 37.6 % (ref 34.0–44.0)
HEMOGLOBIN: 12.1 g/dL (ref 11.3–14.9)
LYMPHOCYTES ABSOLUTE COUNT: 1.5 10*9/L (ref 1.1–3.6)
LYMPHOCYTES RELATIVE PERCENT: 32.2 %
MEAN CORPUSCULAR HEMOGLOBIN CONC: 32.2 g/dL (ref 32.0–36.0)
MEAN CORPUSCULAR HEMOGLOBIN: 28.7 pg (ref 25.9–32.4)
MEAN CORPUSCULAR VOLUME: 89.3 fL (ref 77.6–95.7)
MEAN PLATELET VOLUME: 8.8 fL (ref 6.8–10.7)
MONOCYTES ABSOLUTE COUNT: 0.3 10*9/L (ref 0.3–0.8)
MONOCYTES RELATIVE PERCENT: 6.5 %
NEUTROPHILS ABSOLUTE COUNT: 2.7 10*9/L (ref 1.8–7.8)
NEUTROPHILS RELATIVE PERCENT: 58.7 %
PLATELET COUNT: 389 10*9/L (ref 150–450)
RED BLOOD CELL COUNT: 4.21 10*12/L (ref 3.95–5.13)
RED CELL DISTRIBUTION WIDTH: 14.5 % (ref 12.2–15.2)
WBC ADJUSTED: 4.6 10*9/L (ref 3.6–11.2)

## 2022-09-15 LAB — URINALYSIS WITH MICROSCOPY WITH CULTURE REFLEX
BACTERIA: NONE SEEN /HPF
BILIRUBIN UA: NEGATIVE
GLUCOSE UA: NEGATIVE
KETONES UA: NEGATIVE
LEUKOCYTE ESTERASE UA: NEGATIVE
NITRITE UA: NEGATIVE
PH UA: 7 (ref 5.0–9.0)
PROTEIN UA: NEGATIVE
RBC UA: 3 /HPF (ref 0–3)
SPECIFIC GRAVITY UA: 1.015 (ref 1.005–1.030)
SQUAMOUS EPITHELIAL: 3 /HPF (ref 0–5)
UROBILINOGEN UA: 0.2
WBC UA: 0 /HPF (ref 0–3)

## 2022-09-15 LAB — BASIC METABOLIC PANEL
ANION GAP: 6 mmol/L (ref 5–14)
BLOOD UREA NITROGEN: 14 mg/dL (ref 9–23)
BUN / CREAT RATIO: 18
CALCIUM: 9.5 mg/dL (ref 8.7–10.4)
CHLORIDE: 107 mmol/L (ref 98–107)
CO2: 30.5 mmol/L (ref 20.0–31.0)
CREATININE: 0.8 mg/dL
EGFR CKD-EPI (2021) FEMALE: 86 mL/min/{1.73_m2} (ref >=60)
GLUCOSE RANDOM: 106 mg/dL (ref 70–179)
POTASSIUM: 3.8 mmol/L (ref 3.4–4.8)
SODIUM: 143 mmol/L (ref 135–145)

## 2022-09-15 LAB — LYMPH MARKER LIMITED,FLOW
ABSOLUTE CD3 CNT: 1230 {cells}/uL (ref 915–3400)
ABSOLUTE CD4 CNT: 855 {cells}/uL (ref 510–2320)
ABSOLUTE CD8 CNT: 375 {cells}/uL (ref 180–1520)
CD3% (T CELLS): 82 % (ref 61–86)
CD4% (T HELPER): 57 % (ref 34–58)
CD4:CD8 RATIO: 2.3 (ref 0.9–4.8)
CD8% T SUPPRESR: 25 % (ref 12–38)

## 2022-09-15 LAB — HIV RNA, QUANTITATIVE, PCR
HIV RNA QNT RSLT: DETECTED — AB
HIV RNA: <20 {copies}/mL — ABNORMAL HIGH (ref <0)

## 2022-09-15 LAB — AST: AST (SGOT): 23 U/L (ref <=34)

## 2022-09-15 LAB — ALT: ALT (SGPT): 19 U/L (ref 10–49)

## 2022-09-15 MED ORDER — VENLAFAXINE ER 150 MG CAPSULE,EXTENDED RELEASE 24 HR
ORAL_CAPSULE | Freq: Every day | ORAL | 0 refills | 90.00000 days | Status: CP
Start: 2022-09-15 — End: 2022-09-15
  Filled 2022-10-06: qty 30, 30d supply, fill #0

## 2022-09-15 MED ORDER — GENVOYA 150 MG-150 MG-200 MG-10 MG TABLET
ORAL_TABLET | Freq: Every day | ORAL | 11 refills | 30.00000 days | Status: CP
Start: 2022-09-15 — End: 2022-09-15
  Filled 2022-10-06: qty 30, 30d supply, fill #0

## 2022-09-15 MED ORDER — OXYBUTYNIN CHLORIDE ER 10 MG TABLET,EXTENDED RELEASE 24 HR
ORAL_TABLET | Freq: Every day | ORAL | 0 refills | 30 days | Status: CP
Start: 2022-09-15 — End: ?

## 2022-09-15 MED ORDER — AMLODIPINE 5 MG TABLET
ORAL_TABLET | Freq: Every day | ORAL | 0 refills | 90 days | Status: CP
Start: 2022-09-15 — End: 2022-09-15
  Filled 2022-10-06: qty 15, 30d supply, fill #0

## 2022-09-15 MED ORDER — VALACYCLOVIR 1 GRAM TABLET
ORAL_TABLET | Freq: Every day | ORAL | 11 refills | 30.00000 days | Status: CP
Start: 2022-09-15 — End: 2022-09-15
  Filled 2022-10-06: qty 30, 30d supply, fill #0

## 2022-09-15 NOTE — Unmapped (Addendum)
COVID Education:  Make sure you perform good hand washing (lasting 20 seconds), continue to social distance and limit close personal contact (which may include new sexual partners or having multiple partners during this period).  Try to isolate at home but please find ways to keep in touch with those close to you, such as meeting up with them electronically or socially distanced, and the ability to go outdoors alone or separated from others  If you become ill with fever, respiratory illness, sudden loss of taste and smell, stomach issues, diarrhea, nausea, vomiting - contact clinic for further instructions.  You should go to the emergency department if you develop systems such as shortness of breath, confusion, lightheadedness when standing, high fever.   Here is some information about HIV and CoVid vaccines: [email or web address]  If you're interested in receiving the CoVid vaccine when you're eligible, here are some resources for you to check and make an appointment:  Your local health department   [email or web address] through [facility]  [email or web address]  [facility] (if you are an established patient with them)  [email or web address]    URGENT CARE  Please call ahead to speak with the nursing staff if you are in need of an urgent appointment.       MEDICATIONS  For refills please contact your pharmacy and ask them to electronically send or fax the request to the clinic.   Please bring all medications in original bottles to every appointment.    HMAP (formerly ADAP) or [employer] Eligibility (required even if you do not receive medication through [facility])  Please remember to renew your Juanell Fairly eligibility during renewal periods which occur twice a year: January-March and July-September.     The following are needed for each renewal:   - [facility] Identification (if you don't have one, then a bill with your name and address in [place])   - proof of income (award letter, W-2, or last three check stubs)   If you are unable to come in for renewal, let us know if we can mail, fax or e-mail paperwork to you.   HMAP Contact: [phone].     Lab info:  Your most recent CD4 T-cell counts and viral loads are below. Here are a few things to keep in mind when looking at your numbers:  Our goal is to get your virus to be undetectable and keep it undetectable. If the virus is undetectable you are much more likely to stay healthy.  We consider your viral load to be undetectable if it says <40 or if it says Not detected.  For most people, we're checking CD4 counts every other visit (once or twice a year, or sometimes even less).  It's normal for your CD4 count to be different from visit to visit.   You can help by taking your medications at about the same time, every single day. If you're having trouble with taking your medications, it's important to let us know.    Lab Results   Component Value Date    ACD4 560 10/30/2021    CD4 40 10/30/2021    HIVCP 29 (H) 03/02/2022    HIVRS Detected (A) 03/02/2022        Please note that your laboratory and other results may be visible to you in real time, possibly before they reach your provider. Please allow 48 hours for clinical interpretation of these results. Importantly, even if a result is flagged as abnormal, it may not be one that impacts  your health.    It was nice to have a visit with you today!  Follow-up information:        Provider today:  Varney Daily, FNP-BC      ID CLINIC address:   [facility]  [address]  [place], [place] [address]    Contact information:    The ID clinic phone number is [phone]   The ID clinic fax number is [phone]  For urgent issues on nights and weekends: Call the ID Physician on-call through the [facility] Operator at [phone].    Please sign up for My [facility] Chart - This is a great way to review your labs and track your appointments    Please try to arrive 30 minutes BEFORE your scheduled appointment time!  This will give you time to fill out any front desk paperwork needed for your visit, and allow you to be seen as close to your scheduled appointment time as possible.

## 2022-09-17 DIAGNOSIS — N3281 Overactive bladder: Principal | ICD-10-CM

## 2022-09-17 MED ORDER — OXYBUTYNIN CHLORIDE ER 10 MG TABLET,EXTENDED RELEASE 24 HR
ORAL_TABLET | Freq: Every day | ORAL | 0 refills | 30 days | Status: CP
Start: 2022-09-17 — End: ?

## 2022-09-17 NOTE — Unmapped (Addendum)
Stated the medication for overactive bladder was not at the [facility] on [address]. Prescription in chart has a pharmacy received date of 09/15/22 @ 10:52. Called [facility] and was told patient has [employer] and her [facility]. Relayed that message to Beverly. She [facility] [address] in [place].         ----- Message from Franki Cabot sent at 09/17/2022 10:40 AM EST -----  Incoming Call  Caller: Karen Strickland  Best callback number: [phone]  Reason for call: Patient needs a phone call regarding prescription that is not at her pharmacy        Thank you!

## 2022-10-05 NOTE — Unmapped (Signed)
[facility] Specialty Pharmacy Refill Coordination Note    Specialty Medication(s) to be Shipped:   Infectious Disease: Genvoya    Other medication(s) to be shipped: valACYclovir 1000 MG tablet, venlafaxine 150 MG 24 hr capsule ,amlodipine 5 MG tablet (NORVASC)      Lillia Corporal, DOB: 02-05-1966  Phone: [phone] (home)       All above HIPAA information was verified with patient.     Was a Nurse, learning disability used for this call? No    Completed refill call assessment today to schedule patient's medication shipment from the [facility] [phone]).  All relevant notes have been reviewed.     Specialty medication(s) and dose(s) confirmed: Regimen is correct and unchanged.   Changes to medications: Yamilett reports no changes at this time.  Changes to insurance: No  New side effects reported not previously addressed with a pharmacist or physician: None reported  Questions for the pharmacist: No    Confirmed patient received a Conservation officer, historic buildings and a Surveyor, mining with first shipment. The patient will receive a drug information handout for each medication shipped and additional FDA Medication Guides as required.       DISEASE/MEDICATION-SPECIFIC INFORMATION        N/A    SPECIALTY MEDICATION ADHERENCE     Medication Adherence    Patient reported X missed doses in the last month: 0  Specialty Medication: genvoya 150-150-200-10mg   Patient is on additional specialty medications: No                          Were doses missed due to medication being on hold? No    Genvoya 150-150-200-10 mg: 3 days of medicine on hand        REFERRAL TO PHARMACIST     Referral to the pharmacist: Not needed      [facility]     Shipping address confirmed in Epic.     Delivery Scheduled: Yes, Expected medication delivery date: 10/07/22.     Medication will be delivered via UPS to the prescription address in Epic WAM.    Willette Pa   [facility] Specialty Technician

## 2022-10-29 NOTE — Unmapped (Signed)
[facility] Specialty Pharmacy Refill Coordination Note    Specialty Medication(s) to be Shipped:   Infectious Disease: Genvoya    Other medication(s) to be shipped: amlodipine  Valacyclovir  Venlafaxine       Karen Strickland, DOB: 05-02-1966  Phone: [phone] (home)       All above HIPAA information was verified with patient.     Was a Nurse, learning disability used for this call? No    Completed refill call assessment today to schedule patient's medication shipment from the [facility] [phone]).  All relevant notes have been reviewed.     Specialty medication(s) and dose(s) confirmed: Regimen is correct and unchanged.   Changes to medications: Karen Strickland reports no changes at this time.  Changes to insurance: No  New side effects reported not previously addressed with a pharmacist or physician: None reported  Questions for the pharmacist: No    Confirmed patient received a Conservation officer, historic buildings and a Surveyor, mining with first shipment. The patient will receive a drug information handout for each medication shipped and additional FDA Medication Guides as required.       DISEASE/MEDICATION-SPECIFIC INFORMATION        N/A    SPECIALTY MEDICATION ADHERENCE     Medication Adherence    Patient reported X missed doses in the last month: 0  Specialty Medication: genvoya 150-150-200-10mg                                 Were doses missed due to medication being on hold? No    genvoya 150-150-200-10mg   : 5 days of medicine on hand       REFERRAL TO PHARMACIST     Referral to the pharmacist: Not needed      [facility]     Shipping address confirmed in Epic.     Delivery Scheduled: Yes, Expected medication delivery date: 2/13.     Medication will be delivered via UPS to the prescription address in Epic WAM.    Karen Strickland   [facility] Specialty Technician

## 2022-11-01 ENCOUNTER — Ambulatory Visit: Admit: 2022-11-01 | Payer: PRIVATE HEALTH INSURANCE

## 2022-11-01 DIAGNOSIS — B20 Human immunodeficiency virus [HIV] disease: Principal | ICD-10-CM

## 2022-11-01 NOTE — Unmapped (Signed)
Lillia Corporal 's genvoya shipment will be delayed as a result of a high copay. MAPs working on Marine scientist.    I have reached out to the patient  at [phone] and communicated the delay. We will call the patient back to reschedule the delivery upon resolution. We have not confirmed the new delivery date.

## 2022-11-02 MED FILL — GENVOYA 150 MG-150 MG-200 MG-10 MG TABLET: ORAL | 30 days supply | Qty: 30 | Fill #1

## 2022-11-02 MED FILL — VENLAFAXINE ER 150 MG CAPSULE,EXTENDED RELEASE 24 HR: ORAL | 30 days supply | Qty: 30 | Fill #1

## 2022-11-02 MED FILL — AMLODIPINE 5 MG TABLET: ORAL | 30 days supply | Qty: 15 | Fill #1

## 2022-11-02 MED FILL — VALACYCLOVIR 1 GRAM TABLET: ORAL | 30 days supply | Qty: 30 | Fill #1

## 2022-11-02 NOTE — Unmapped (Signed)
Karen Strickland 's genvoya shipment will be sent out  as a result of copay is now approved by patient/caregiver.      I have reached out to the patient  at [phone] and communicated the delivery change. We will reschedule the medication for the delivery date that the patient agreed upon.  We have confirmed the delivery date as 2/14, via ups.

## 2022-11-03 NOTE — Unmapped (Addendum)
Name: Karen Strickland  Date: 11/03/2022  Address: [address]  [place] [address]   [place] of Residence:  [place]  Phone: [phone]     Started assessment with patient options: over the phone     Is this the same address for mailing? Yes  If No, Mailing Address is:     Building surveyor - Individual     Tax Filing Status  I did not file taxes     Employment Status  Employed Full Time    Income  Salary/Wages    If no or low income, how are you meeting your basic needs?  Not Applicable    List Tax Household Members including relationship to you:   N/A    Someone in my household receives: Not Applicable (for household members)  Specify who: N/A    Medication Access/Barriers: None    Do you have a current diagnosis for Hepatitis C?  No results found for: HEPCAB, HCVR, HCVRNA, HCVIU, HCVRNAIU, HCVGENOTYPE    Have you used tobacco products four or more times per week in the last six months?  No    Federal Marketplace Eligibility Assessment  Patient has Marketplace coverage.     Patient given ACA education if they qualified based on answers to questions above.     MyChart  Do you have an active MyChart account? Yes     If MyChart is not set up, informed patient on how to set up MyChart N/A    Patient was informed of the following programs;   N/A    The following applications/handouts were given to patient:   N/A    The following forms were also started with the patient:   N/A    Juanell Fairly application status: Incomplete; patient needs to send proof of income (2 paystubs)    Patient is applying for [employer] on Charges Only     Additional Comments: sent email for proof of income.      Mickle Asper,  Benefits & Eligibility Coordinator  Time of Intervention: 5 minutes

## 2022-11-26 IMAGING — MR MR KNEE*R* W/O CM
6 series · 40 of 40 positions shown · non-contrast
Comparison: None.

CLINICAL DATA: Right knee pain for 3 months. Pain is worse on the
medial side. No known injury.

EXAM:
MRI OF THE RIGHT KNEE WITHOUT CONTRAST
TECHNIQUE: Multiplanar, multisequence MR imaging of the knee was performed. No
intravenous contrast was administered.

[Series 6: T2 fat-sat · axial · right · 4.0mm · 0.50mm/px · z∈[-65,+88]mm · 9 of 36 slices shown (1 of 3)]
[im 1/36]
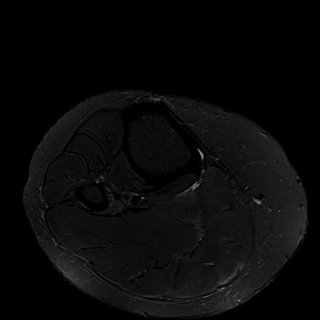
[im 5/36]
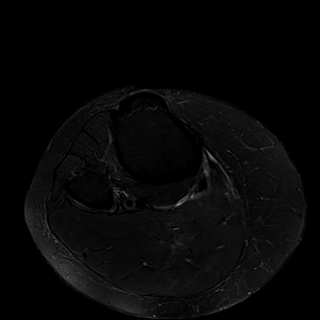
[im 9/36]
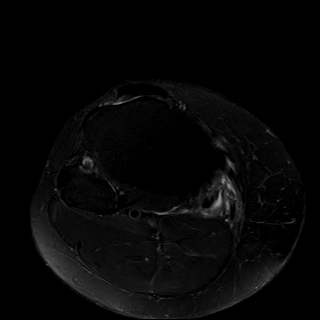
[im 14/36]
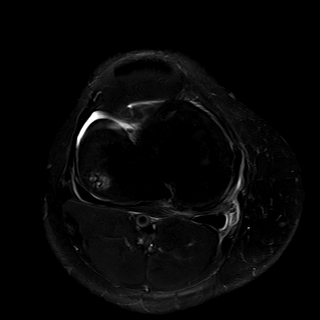
[im 18/36]
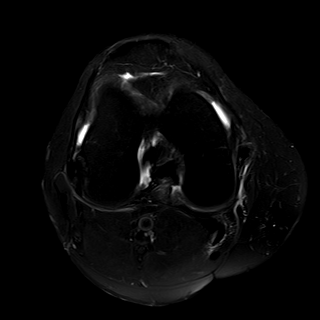
[im 22/36]
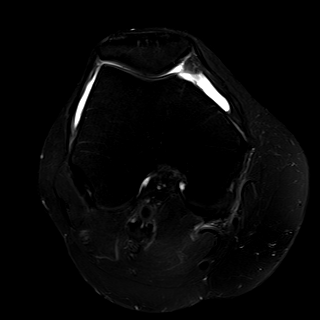
[im 27/36]
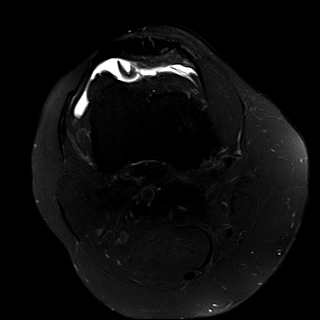
[im 31/36]
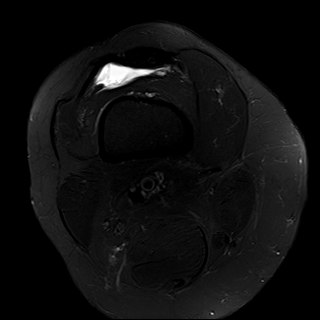
[im 36/36]
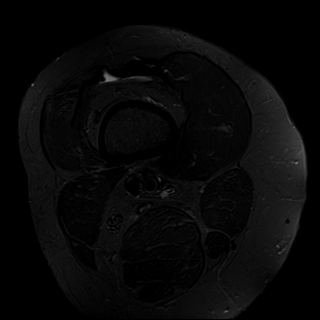

[Series 7: T2 fat-sat · coronal · right · 4.0mm · 0.47mm/px · 6 of 30 slices shown (2 of 3)]
[im 1/30]
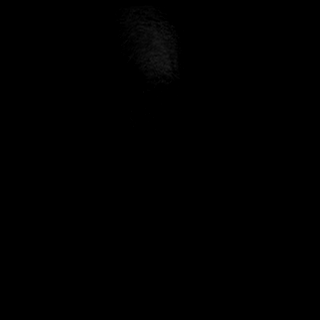
[im 6/30]
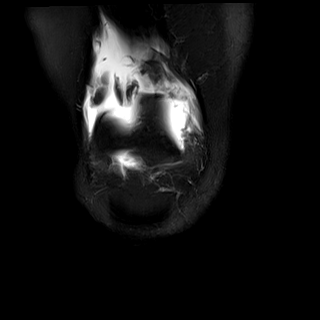
[im 12/30]
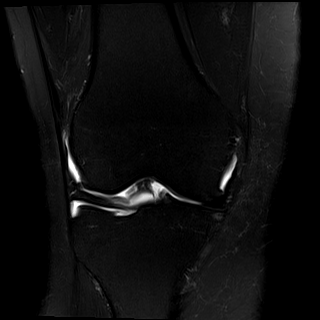
[im 18/30]
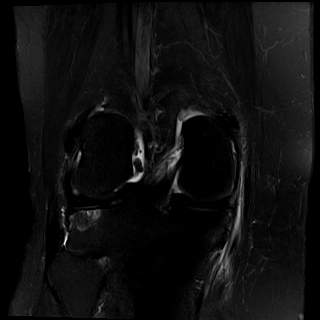
[im 24/30]
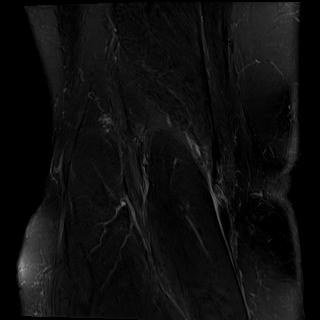
[im 30/30]
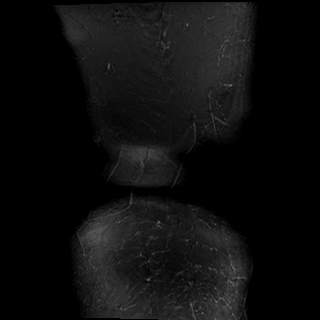

[Series 8: T1 · coronal · right · 4.0mm · 0.47mm/px · 6 of 30 slices shown]
[im 1/30]
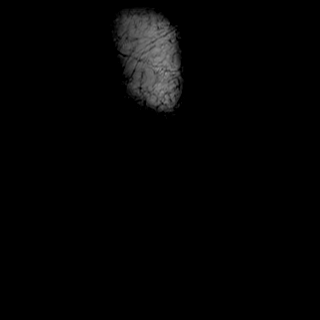
[im 6/30]
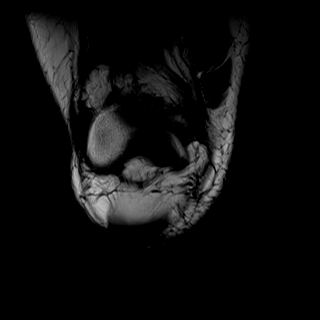
[im 12/30]
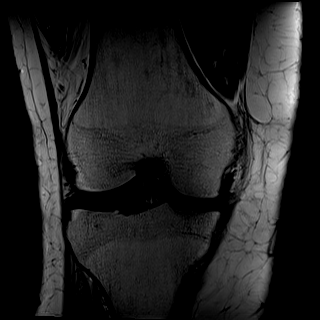
[im 18/30]
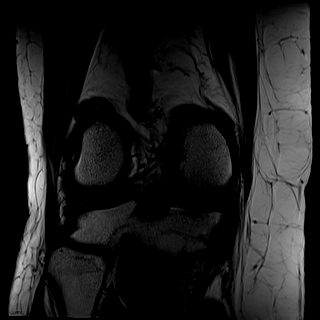
[im 24/30]
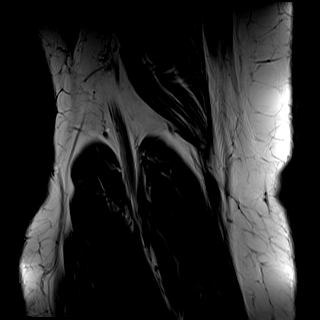
[im 30/30]
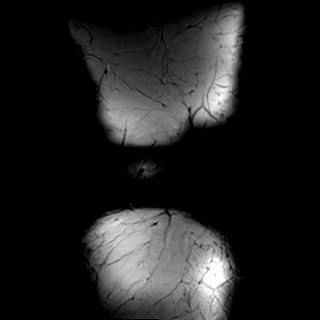

[Series 9: PD fat-sat · coronal · right · 3.0mm · 0.47mm/px · 7 of 32 slices shown (1 of 2)]
[im 1/32]
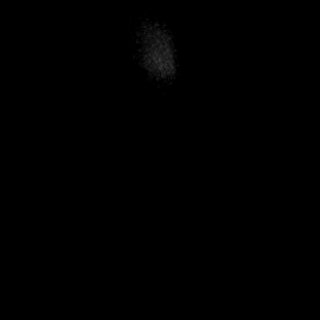
[im 6/32]
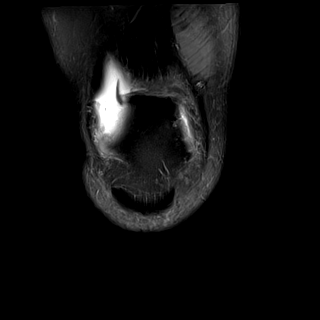
[im 11/32]
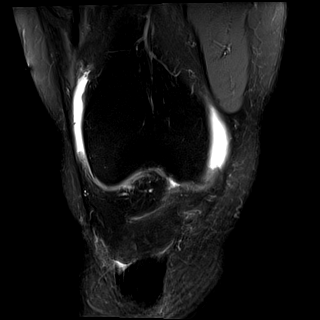
[im 16/32]
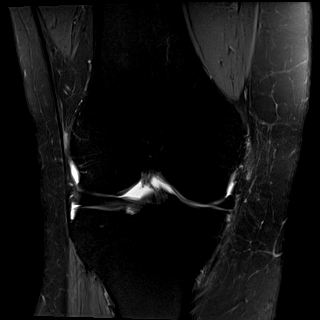
[im 21/32]
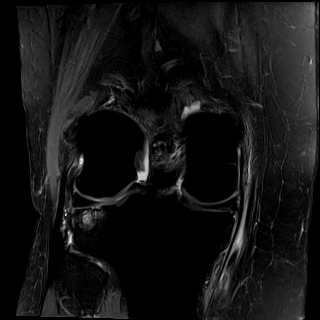
[im 26/32]
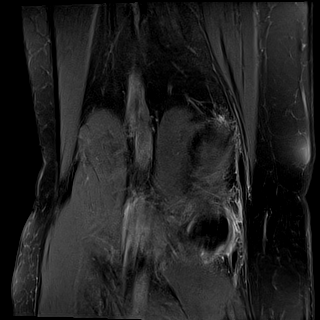
[im 32/32]
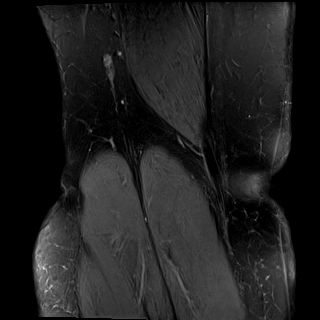

[Series 10: PD fat-sat · sagittal · right · 3.0mm · 0.47mm/px · 6 of 29 slices shown (2 of 2)]
[im 1/29]
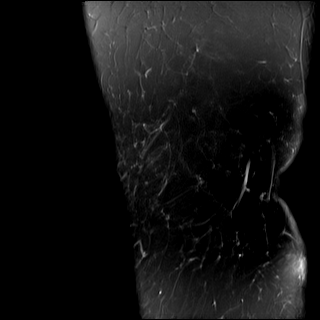
[im 6/29]
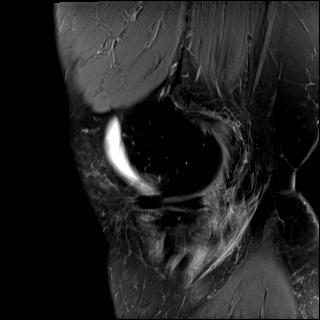
[im 12/29]
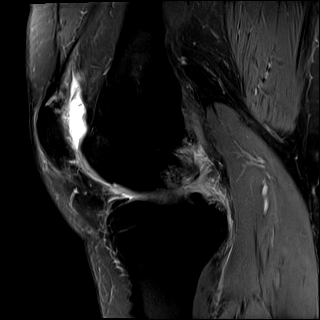
[im 17/29]
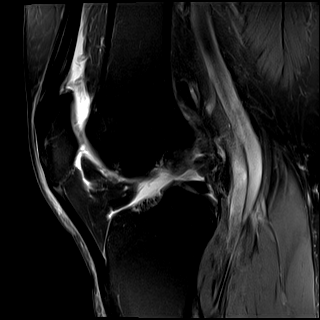
[im 23/29]
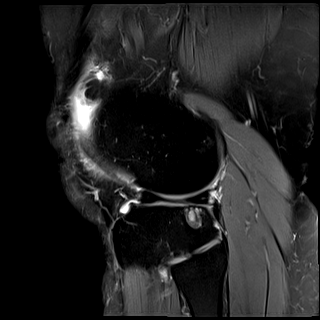
[im 29/29]
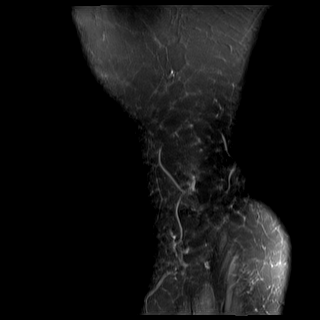

[Series 11: T2 fat-sat · sagittal · right · 3.0mm · 0.47mm/px · 6 of 29 slices shown (3 of 3)]
[im 1/29]
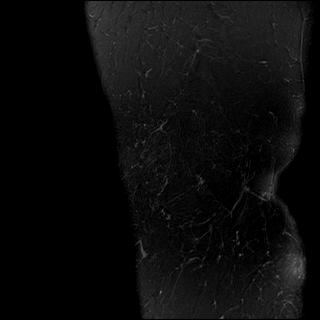
[im 6/29]
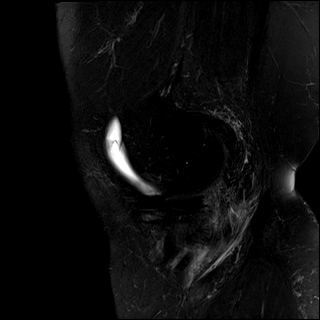
[im 12/29]
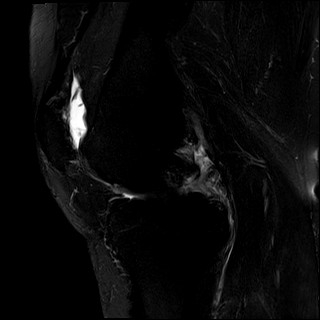
[im 17/29]
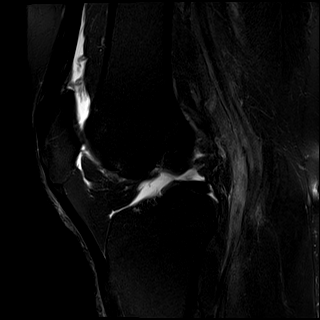
[im 23/29]
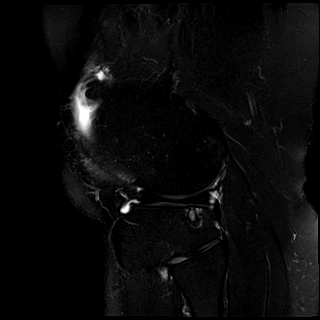
[im 29/29]
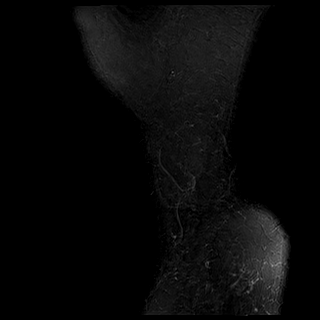

[40 of 40 positions shown; findings below may reference images not displayed]

FINDINGS: MENISCI

Medial meniscus: Focal blunting along the free edge of the body is
consistent with a radial tear. There is also a horizontal tear in
the posterior horn and posterior body. No displaced fragment.

Lateral meniscus:  Intact.

LIGAMENTS

Cruciates:  Intact.

Collaterals: Intact. The superior fibers of the medial collateral
ligament are thickened suggestive of remote sprain. There is no
signal abnormality within the ligament.

CARTILAGE

Patellofemoral: Cartilage of the patella is thinned and frayed
without focal defect.

Medial:  Thinned without focal defect.

Lateral:  Thinned without focal defect.

Joint:  Small joint effusion.

Popliteal Fossa: Small Baker's cyst. There is fluid about the distal
pes anserine tendons compatible with bursitis.

Extensor Mechanism:  Intact.

Bones: No fracture, stress change or worrisome lesion. Subchondral
cyst formation in the posterior aspect of the lateral tibia is
noted.

Other: None.
IMPRESSION: Horizontal tear in the posterior horn and posterior body of the
medial meniscus. There is also a focal radial tear along the free
edge of the body of the medial meniscus.

Mild-to-moderate tricompartmental osteoarthritis.

Remote MCL sprain without tear.

Pes anserine bursitis.

## 2022-11-30 NOTE — Unmapped (Signed)
[facility] Special[facility] Pharmacy Clinical Assessment & Refill Coordination Note    Karen Strickland, DOB: [birth year]  Phone: [phone] (home)     All above HIPAA information was verified with patient.     Was a Nurse, learning disability used for this call? No    Specialty Medication(s):   Infectious Disease: Genvoya     Current Outpatient Medications   Medication Sig Dispense Refill    amlodipine (NORVASC) 5 MG tablet Take 1/2 tablet (2.5 mg total) by mouth daily. 45 tablet 0    elvitegravir-cobicistat-emtricitabine-tenofovir (GENVOYA) 150-150-200-10 mg Tab tablet Take 1 tablet by mouth daily. 30 tablet 11    oxybutynin (DITROPAN-XL) 10 MG 24 hr tablet Take 1 tablet (10 mg total) by mouth daily. 30 tablet 0    valACYclovir (VALTREX) 1000 MG tablet Take 1 tablet (1,000 mg total) by mouth daily. 30 tablet 11    venlafaxine (EFFEXOR-XR) 150 MG 24 hr capsule Take 1 capsule (150 mg total) by mouth daily. 90 capsule 0     No current facility-administered medications for this visit.        Changes to medications: Karen Strickland reports no changes at this time.    No Known Allergies    Changes to allergies: No    SPECIALTY MEDICATION ADHERENCE     Genvoya     : 5 days of medicine on hand       Medication Adherence    Patient reported X missed doses in the last month: 0  Specialty Medication: Genvoya  Patient is on additional specialty medications: No  Any gaps in refill history greater than 2 weeks in the last 3 months: no  Demonstrates understanding of importance of adherence: yes  Informant: patient  Provider-estimated medication adherence level: good  Patient is at risk for Non-Adherence: No          Specialty medication(s) dose(s) confirmed: Regimen is correct and unchanged.     Are there any concerns with adherence? No    Adherence counseling provided? Not needed    CLINICAL MANAGEMENT AND INTERVENTION      Clinical Benefit Assessment:    Do you feel the medicine is effective or helping your condition? Yes    HIV ASSOCIATED LABS:     Lab Results   Component Value Date/Time    HIVRS Detected (A) 09/15/2022 09:31 AM    HIVRS Detected (A) 03/02/2022 01:44 PM    HIVRS Detected (A) 10/30/2021 01:43 PM    HIVCP <20 (H) 09/15/2022 09:31 AM    HIVCP 29 (H) 03/02/2022 01:44 PM    HIVCP 29,805 (H) 10/30/2021 01:43 PM    ACD4 855 09/15/2022 09:31 AM    ACD4 560 10/30/2021 01:43 PM    ACD4 1,008 04/24/2021 05:05 PM       Clinical Benefit counseling provided? Labs from 09/15/22 show evidence of clinical benefit    Adverse Effects Assessment:    Are you experiencing any side effects? No    Are you experiencing difficulty administering your medicine? No    Quality of Life Assessment:    How many days over the past month did your HIV  keep you from your normal activities? For example, brushing your teeth or getting up in the morning. 0    Have you discussed this with your provider? Not needed    Acute Infection Status:    Acute infections noted within Epic:  No active infections  Patient reported infection: None    Therapy Appropriateness:    Is  therapy appropriate and patient progressing towards therapeutic goals? Yes, therapy is appropriate and should be continued    DISEASE/MEDICATION-SPECIFIC INFORMATION      N/A    HIV: Not Applicable    PATIENT SPECIFIC NEEDS     Does the patient have any physical, cognitive, or cultural barriers? No    Is the patient high risk? No    Did the patient require a clinical intervention? No    Does the patient require physician intervention or other additional services (i.e., nutrition, smoking cessation, social work)? No    SOCIAL DETERMINANTS OF HEALTH     At the [facility], we have learned that life circumstances - like trouble affording food, housing, utilities, or transportation can affect the health of many of our patients.   That is why we wanted to ask: are you currently experiencing any life circumstances that are negatively impacting your health and/or quality of life? Patient declined to answer    Social Determinants of Health     Financial Resource Strain: Not on file   Internet Connectivity: Not on file   Food Insecurity: Food Insecurity Present (04/24/2021)    Hunger Vital Sign     Worried About Running Out of Food in the Last Year: Sometimes true     Ran Out of Food in the Last Year: Sometimes true   Tobacco Use: Low Risk  (09/15/2022)    Patient History     Smoking Tobacco Use: Never     Smokeless Tobacco Use: Never     Passive Exposure: Not on file   Housing/Utilities: Low Risk  (04/24/2021)    Housing/Utilities     Within the past 12 months, have you ever stayed: outside, in a car, in a tent, in an overnight shelter, or temporarily in someone else's home (i.e. couch-surfing)?: No     Are you worried about losing your housing?: No     Within the past 12 months, have you been unable to get utilities (heat, electricity) when it was really needed?: No   Alcohol Use: Not At Risk (04/24/2021)    Alcohol Use     How often do you have a drink containing alcohol?: Never     How many drinks containing alcohol do you have on a typical day when you are drinking?: Not on file     How often do you have 5 or more drinks on one occasion?: Not on file   Transportation Needs: No Transportation Needs (04/24/2021)    PRAPARE - Therapist, art (Medical): No     Lack of Transportation (Non-Medical): No   Substance Use: Not on file   Health Literacy: Not on file   Physical Activity: Not on file   Interpersonal Safety: Not on file   Stress: Not on file   Intimate Partner Violence: Unknown (12/23/2021)    Received from [facility]    HITS     Physically Hurt: Not on file     Insult or Talk Down To: Not on file     Threaten Physical Harm: Not on file     Scream or Curse: Not on file   Depression: Not at risk (04/20/2022)    Received from [facility]    PHQ-2     Patient Health Questionnaire-2 Score: 0   Social Connections: Unknown (01/26/2022)    Received from [employer]    Social Network     Social Network: Not on file  Would you be willing to receive help with any of the needs that you have identified today? Not applicable       SHIPPING     Specialty Medication(s) to be Shipped:   Infectious Disease: Genvoya    Other medication(s) to be shipped:   Amlodipine 5mg   Valacyclovir 1000mg   Venlafaxine 150mg      Changes to insurance: No    Patient was informed of new phone menu: Yes    Delivery Scheduled: Yes, Expected medication delivery date: 12/02/22.     Medication will be delivered via UPS to the confirmed prescription address in [facility].    The patient will receive a drug information handout for each medication shipped and additional FDA Medication Guides as required.  Verified that patient has previously received a Conservation officer, historic buildings and a Surveyor, mining.    The patient or caregiver noted above participated in the development of this care plan and knows that they can request review of or adjustments to the care plan at any time.      All of the patient's questions and concerns have been addressed.    Roderic Palau, PharmD   [facility] Specialty Pharmacist

## 2022-12-01 MED FILL — VENLAFAXINE ER 150 MG CAPSULE,EXTENDED RELEASE 24 HR: ORAL | 30 days supply | Qty: 30 | Fill #2

## 2022-12-01 MED FILL — GENVOYA 150 MG-150 MG-200 MG-10 MG TABLET: ORAL | 30 days supply | Qty: 30 | Fill #2

## 2022-12-01 MED FILL — VALACYCLOVIR 1 GRAM TABLET: ORAL | 30 days supply | Qty: 30 | Fill #2

## 2022-12-01 MED FILL — AMLODIPINE 5 MG TABLET: ORAL | 30 days supply | Qty: 15 | Fill #2

## 2022-12-29 DIAGNOSIS — F32A Depression, unspecified depression type: Principal | ICD-10-CM

## 2022-12-29 DIAGNOSIS — I10 Essential (primary) hypertension: Principal | ICD-10-CM

## 2022-12-29 MED ORDER — VENLAFAXINE ER 150 MG CAPSULE,EXTENDED RELEASE 24 HR
ORAL_CAPSULE | Freq: Every day | ORAL | 1 refills | 90 days
Start: 2022-12-29 — End: ?

## 2022-12-29 MED ORDER — AMLODIPINE 5 MG TABLET
ORAL_TABLET | Freq: Every day | ORAL | 1 refills | 90 days
Start: 2022-12-29 — End: ?

## 2022-12-29 NOTE — Unmapped (Signed)
[facility] Specialty Pharmacy Refill Coordination Note    Specialty Medication(s) to be Shipped:   Infectious Disease: Genvoya    Other medication(s) to be shipped:  valacyclovir , amlodipine and venlafaxine     Karen Strickland, DOB: 12-Aug-1966  Phone: [phone] (home)       All above HIPAA information was verified with patient.     Was a Nurse, learning disability used for this call? No    Completed refill call assessment today to schedule patient's medication shipment from the [facility] [phone]).  All relevant notes have been reviewed.     Specialty medication(s) and dose(s) confirmed: Regimen is correct and unchanged.   Changes to medications: Leathia reports no changes at this time.  Changes to insurance: No  New side effects reported not previously addressed with a pharmacist or physician: None reported  Questions for the pharmacist: No    Confirmed patient received a Conservation officer, historic buildings and a Surveyor, mining with first shipment. The patient will receive a drug information handout for each medication shipped and additional FDA Medication Guides as required.       DISEASE/MEDICATION-SPECIFIC INFORMATION        N/A    SPECIALTY MEDICATION ADHERENCE     Medication Adherence    Patient reported X missed doses in the last month: 0  Specialty Medication: genvoya 150-150-200-10mg   Patient is on additional specialty medications: No              Were doses missed due to medication being on hold? No    Genvoya 150-150-200-10 mg: 10 days of medicine on hand     REFERRAL TO PHARMACIST     Referral to the pharmacist: Not needed      [facility]     Shipping address confirmed in Epic.       Delivery Scheduled: Yes, Expected medication delivery date: 01/04/23.     Medication will be delivered via UPS to the prescription address in Epic WAM.    [name]   [facility] Specialty Technician

## 2022-12-30 NOTE — Unmapped (Signed)
Medication Requested: Effexor Norvasc      Future Appointments   Date Time Provider Department Center   03/17/2023  9:30 AM Varney Daily I, FNP [facility]     Per Provider Note: Hypertension  - chronic, stable - 121/87 today, Continue Amlodipine    Depression  - chronic, stable -  On Effexor     Standing order protocol requirements met?: No    Sent to: Provider for signing    Days Supply Given: TBD by provider  Number of Refills: TBD by provider

## 2022-12-31 MED ORDER — VENLAFAXINE ER 150 MG CAPSULE,EXTENDED RELEASE 24 HR
ORAL_CAPSULE | Freq: Every day | ORAL | 1 refills | 90 days
Start: 2022-12-31 — End: ?

## 2022-12-31 MED ORDER — AMLODIPINE 5 MG TABLET
ORAL_TABLET | Freq: Every day | ORAL | 1 refills | 90 days
Start: 2022-12-31 — End: ?

## 2023-01-01 NOTE — Unmapped (Signed)
Patient has established primary care with [facility] at [facility]. Forwarding refill request to her PCP.

## 2023-01-03 DIAGNOSIS — F32A Depression, unspecified depression type: Principal | ICD-10-CM

## 2023-01-03 DIAGNOSIS — I10 Essential (primary) hypertension: Principal | ICD-10-CM

## 2023-01-03 MED ORDER — VENLAFAXINE ER 150 MG CAPSULE,EXTENDED RELEASE 24 HR
ORAL_CAPSULE | Freq: Every day | ORAL | 0 refills | 90 days | Status: CP
Start: 2023-01-03 — End: ?
  Filled 2023-01-03: qty 30, 30d supply, fill #0

## 2023-01-03 MED ORDER — AMLODIPINE 5 MG TABLET
ORAL_TABLET | Freq: Every day | ORAL | 0 refills | 90 days | Status: CP
Start: 2023-01-03 — End: ?
  Filled 2023-01-03: qty 15, 30d supply, fill #0

## 2023-01-03 MED FILL — VALACYCLOVIR 1 GRAM TABLET: ORAL | 30 days supply | Qty: 30 | Fill #3

## 2023-01-03 MED FILL — GENVOYA 150 MG-150 MG-200 MG-10 MG TABLET: ORAL | 30 days supply | Qty: 30 | Fill #3

## 2023-01-10 ENCOUNTER — Ambulatory Visit: Admit: 2023-01-10 | Discharge: 2023-01-11 | Payer: PRIVATE HEALTH INSURANCE

## 2023-01-10 DIAGNOSIS — R140 Abdominal distension (gaseous): Principal | ICD-10-CM

## 2023-01-10 DIAGNOSIS — K219 Gastro-esophageal reflux disease without esophagitis: Principal | ICD-10-CM

## 2023-01-10 DIAGNOSIS — K5900 Constipation, unspecified: Principal | ICD-10-CM

## 2023-01-10 MED ORDER — SIMETHICONE 180 MG CAPSULE
ORAL_CAPSULE | Freq: Four times a day (QID) | ORAL | 0 refills | 23 days | Status: CP | PRN
Start: 2023-01-10 — End: ?

## 2023-01-10 MED ORDER — POLYETHYLENE GLYCOL 3350 17 GRAM ORAL POWDER PACKET
PACK | Freq: Every day | ORAL | 3 refills | 30 days | Status: CP
Start: 2023-01-10 — End: ?

## 2023-01-10 MED ORDER — PANTOPRAZOLE 40 MG TABLET,DELAYED RELEASE
ORAL_TABLET | Freq: Every day | ORAL | 0 refills | 30 days | Status: CP
Start: 2023-01-10 — End: 2023-02-09
  Filled 2023-01-10: qty 30, 30d supply, fill #0

## 2023-01-10 NOTE — Unmapped (Signed)
[facility]     Type of visit: face to face    Are you located in [place]? (for virtual visits only) N/A    Reason for visit: Follow up Patient stated nausea and bloat pain since last week    Questions / Concerns that need to be addressed:     Screening BP    Omron BPs (complete if screening BP has a systolic  > 130 or diastolic > 80)  BP#1 136/83   BP#2 126/75  BP#3 127/73    Average BP 130/77 P 90  (please note this as a comment in vitals)     PTHomeBP         HCDM reviewed and updated in Epic:    We are working to make sure all of our patients??? wishes are updated in Epic and part of that is documenting a Environmental health practitioner for each patient  A Health Care Decision Maker is someone you choose who can make health care decisions for you if you are not able - who would you most want to do this for you????  was updated.    HCDM (patient stated preference): Alston,Blanche - [phone]    BPAs completed:      COVID-19 Vaccine Summary  Which COVID-19 Vaccine was administered  Moderna  Type:  Dates Given:  09/15/2022                     Immunization History   Administered Date(s) Administered    COVID-19 VAC,BIVALENT,MODERNA(BLUE CAP) 03/02/2022    COVID-19 VACC,MRNA(BOOSTER)OR(6-29YR)MODERNA 02/11/2021    COVID-19 VACCINE,MRNA(MODERNA)(PF) 02/13/2020, 03/12/2020    Covid-19 Vac, (69yr+) (Spikevax) Monovalent Xbb.1.5 Moder  09/15/2022    DTaP, Unspecified Formulation 03/09/1966, 04/08/1966, 05/10/1966, 05/17/1968    Hepatitis B vaccine, pediatric/adolescent dosage, 05/17/2003, 06/18/2003, 11/22/2003    INFLUENZA TIV (TRI) 67MO+ W/ PRESERV (IM) 09/27/2011, 06/07/2012, 07/20/2012, 06/08/2013, 06/03/2015, 08/06/2019    Influenza Vaccine Quad (IIV4 W/PRESERV) 67MO+ 09/27/2011, 06/07/2012, 06/08/2013, 06/03/2015, 08/06/2019    Influenza Vaccine Quad(IM)6 MO-Adult(PF) 10/30/2021, 09/15/2022    Influenza Virus Vaccine, unspecified formulation 09/27/2011, 06/07/2012, 07/20/2012, 06/08/2013, 06/03/2015    Measles 04/12/1969    Pneumococcal Conjugate 20-valent 10/30/2021       __________________________________________________________________________________________    SCREENINGS COMPLETED IN FLOWSHEETS    HARK Screening       AUDIT       PHQ2       PHQ9          P4 Suicidality Screener                GAD7       COPD Assessment       Falls Risk

## 2023-01-10 NOTE — Unmapped (Signed)
-   Start pantoprazole 40mg  once daily (anti-acid medication)  - Start taking miralax once daily, if you do not have more regular bowel movements in 3-5 days you can increase to 2-3 times daily  - Can also try taking gasX (simethicone) every 6 hours as needed for bloating  - Avoid spicy and greasy foods, do not eat within 3 hours of bed time, eat sitting upright and avoid laying down within 1 hour of eating  - If things worsen despite this please call for sooner appointment  - Will schedule or follow up in 1-2 months to re-evaluate further testing as needed depending on symptoms

## 2023-01-10 NOTE — Unmapped (Signed)
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 Gastroesophageal reflux disease, unspecified whether esophagitis present    2. Constipation, unspecified constipation type    3. Bloating      GERD, Bloating, Constipation  Presenting with 1 week of symptoms consistent with GERD/reflux, exacerbated by gas and constipation. No red flag symptoms, dysphagia, or odynophagia. Will start with PPI, simethicone and miralax x1 month. Also discussed lifestyle modifications (avoiding trigger foods, sitting upright with meals). If symptoms worsen or dont improve would consider EGD with biopsies given hx of HIV and oral HSV.   - pantoprazole 40mg  daily x1 month  - PRN simethicone  - miralax daily, titrate up to 1 BM per day     No follow-ups on file.    Staffed with Dr. Hulan Fess, discussed    __________________________________________________________    HPI:    Has had bloating and stomach pain x1 week, generalized pain not in any location  Tried honey and vinegar   Burning sensation and bad taste in the back of his throat   No odynophagia or dysphagia   Worse with greasy foods  Also constipated, not having daily BMs  Generalized malaise but no vomiting, fever, chills, diarrhea, melena/hematochezia    __________________________________________________________    Medications:  Reviewed in EPIC  __________________________________________________________    Physical Exam:   Vital Signs:  Vitals:    01/10/23 1024   BP: 130/77   BP Site: L Arm   BP Position: Sitting   BP Cuff Size: Small   Pulse: 90   Resp: 18   Temp: 35.9 ??C (96.7 ??F)   TempSrc: Temporal   Weight: (!) 121.1 kg (267 lb)   Height: 172.7 cm (5' 7.99)          PTHomeBP    The patient???s Average Home Blood Pressure during the last two weeks is :   / based on  readings    Gen: Well appearing, NAD  CV: RRR, no murmurs  Pulm: CTA bilaterally, no crackles or wheezes  Abd: Soft, NTND, normal BS.   Ext: No edema      PHQ-9 Score:     GAD-7 Score:       Medication adherence and barriers to the treatment plan have been addressed. Opportunities to optimize healthy behaviors have been discussed. Patient / caregiver voiced understanding.

## 2023-01-15 NOTE — Unmapped (Signed)
Addended by: Talbert Forest D on: 01/14/2023 10:09 PM     Modules accepted: Level of Service

## 2023-02-04 DIAGNOSIS — R140 Abdominal distension (gaseous): Principal | ICD-10-CM

## 2023-02-04 DIAGNOSIS — K219 Gastro-esophageal reflux disease without esophagitis: Principal | ICD-10-CM

## 2023-02-04 DIAGNOSIS — K5900 Constipation, unspecified: Principal | ICD-10-CM

## 2023-02-04 MED ORDER — PANTOPRAZOLE 40 MG TABLET,DELAYED RELEASE
ORAL_TABLET | Freq: Every day | ORAL | 0 refills | 30 days | Status: CP
Start: 2023-02-04 — End: 2023-03-06
  Filled 2023-02-09: qty 30, 30d supply, fill #0

## 2023-02-04 NOTE — Unmapped (Signed)
[facility] Refill Coordination Note    Specialty Medication(s) to be Shipped:   Infectious Disease: Genvoya    Other medication(s) to be shipped:  venlafaxine, valacyclovir, amlodipine, pantoprazole     Karen Strickland, DOB: 10-Oct-1965  Phone: [phone] (home)       All above HIPAA information was verified with patient.     Was a Nurse, learning disability used for this call? No    Completed refill call assessment today to schedule patient's medication shipment from the [facility] [phone]).  All relevant notes have been reviewed.     Specialty medication(s) and dose(s) confirmed: Regimen is correct and unchanged.   Changes to medications: Karen Strickland reports no changes at this time.  Changes to insurance: No  New side effects reported not previously addressed with a pharmacist or physician: None reported  Questions for the pharmacist: No    Confirmed patient received a Conservation officer, historic buildings and a Surveyor, mining with first shipment. The patient will receive a drug information handout for each medication shipped and additional FDA Medication Guides as required.       DISEASE/MEDICATION-SPECIFIC INFORMATION        N/A    SPECIALTY MEDICATION ADHERENCE     Medication Adherence    Patient reported X missed doses in the last month: 0  Specialty Medication: GENVOYA 150-150-200-10 mg Tab tablet (elvitegravir-cobicistat-emtricitabine-tenofovir)  Patient is on additional specialty medications: No  Patient is on more than two specialty medications: No  Any gaps in refill history greater than 2 weeks in the last 3 months: no  Demonstrates understanding of importance of adherence: yes  Informant: patient  Confirmed plan for next specialty medication refill: delivery by pharmacy  Refills needed for supportive medications: not needed          Refill Coordination    Has the Patients' Contact Information Changed: No  Is the Shipping Address Different: No         Were doses missed due to medication being on hold? No    GENVOYA 150-150-200-10   mg: 5 days of medicine on hand     REFERRAL TO PHARMACIST     Referral to the pharmacist: Not needed      [facility]     Shipping address confirmed in Epic.       Delivery Scheduled: Yes, Expected medication delivery date: 02/10/2023.     Medication will be delivered via UPS to the prescription address in Epic WAM.    Karen Strickland   [facility] Specialty Technician

## 2023-02-08 DIAGNOSIS — B20 Human immunodeficiency virus [HIV] disease: Principal | ICD-10-CM

## 2023-02-09 MED FILL — AMLODIPINE 5 MG TABLET: ORAL | 30 days supply | Qty: 15 | Fill #1

## 2023-02-09 MED FILL — VENLAFAXINE ER 150 MG CAPSULE,EXTENDED RELEASE 24 HR: ORAL | 30 days supply | Qty: 30 | Fill #1

## 2023-02-09 MED FILL — GENVOYA 150 MG-150 MG-200 MG-10 MG TABLET: ORAL | 30 days supply | Qty: 30 | Fill #4

## 2023-02-09 MED FILL — VALACYCLOVIR 1 GRAM TABLET: ORAL | 30 days supply | Qty: 30 | Fill #4

## 2023-03-08 NOTE — Unmapped (Signed)
[facility] Refill Coordination Note    Specialty Medication(s) to be Shipped:   Infectious Disease: Genvoya    Other medication(s) to be shipped:  amlodipine, valacyclovir, venlafaxine     Karen Strickland, DOB: Apr 27, 1966  Phone: [phone] (home)       All above HIPAA information was verified with patient.     Was a Nurse, learning disability used for this call? No    Completed refill call assessment today to schedule patient's medication shipment from the [facility] [phone]).  All relevant notes have been reviewed.     Specialty medication(s) and dose(s) confirmed: Regimen is correct and unchanged.   Changes to medications: [name] reports no changes at this time.  Changes to insurance: No  New side effects reported not previously addressed with a pharmacist or physician: None reported  Questions for the pharmacist: No    Confirmed patient received a Conservation officer, historic buildings and a Surveyor, mining with first shipment. The patient will receive a drug information handout for each medication shipped and additional FDA Medication Guides as required.       DISEASE/MEDICATION-SPECIFIC INFORMATION        N/A    SPECIALTY MEDICATION ADHERENCE     Medication Adherence    Patient reported X missed doses in the last month: 0  Specialty Medication: GENVOYA 150-150-200-10 mg Tab tablet (elvitegravir-cobicistat-emtricitabine-tenofovir)  Patient is on additional specialty medications: No              Were doses missed due to medication being on hold? No    Genvoya 150-150-200-10  : 4 days of medicine on hand       REFERRAL TO PHARMACIST     Referral to the pharmacist: Not needed      [facility]     Shipping address confirmed in Epic.       Delivery Scheduled: Yes, Expected medication delivery date: 03/10/2023.     Medication will be delivered via UPS to the prescription address in Epic WAM.    Thad Ranger, PharmD   [facility] Specialty Pharmacist

## 2023-03-09 MED FILL — VENLAFAXINE ER 150 MG CAPSULE,EXTENDED RELEASE 24 HR: ORAL | 30 days supply | Qty: 30 | Fill #2

## 2023-03-09 MED FILL — AMLODIPINE 5 MG TABLET: ORAL | 30 days supply | Qty: 15 | Fill #2

## 2023-03-09 MED FILL — GENVOYA 150 MG-150 MG-200 MG-10 MG TABLET: ORAL | 30 days supply | Qty: 30 | Fill #5

## 2023-03-09 MED FILL — VALACYCLOVIR 1 GRAM TABLET: ORAL | 30 days supply | Qty: 30 | Fill #5

## 2023-03-28 ENCOUNTER — Ambulatory Visit: Admit: 2023-03-28 | Payer: PRIVATE HEALTH INSURANCE | Attending: Family | Primary: Family

## 2023-03-28 NOTE — Unmapped (Unsigned)
INFECTIOUS DISEASES CLINIC  [address]  [place], [place]  [address]  P [phone]  F [phone]     Primary care provider: Julieanne Cotton, PA      Assessment/Plan:      HIV (dx'd 2004)  - chronic, stable  Health Department came to her home and told she needed testing.  Participated in study that started her on Edurant(RPV) and Truvada(FTC/TDF)-equivalent to Complera  Started on Odefsy which is covered on current insurance plan in 01/2021. (Patient's insurance did not cover for Complera. Odefsy is equivalent, but has TAF rather than TDF. Patient amenable to starting this.)    Overall doing well. Current regimen: Genvoya (EVG/c/FTC/TAF)   Misses doses of ARVs never     Med access through insurance  CD4 count  see below  Discussed ARV adherence and taking ARVs with food    Lab Results   Component Value Date    ACD4 855 09/15/2022    CD4 57 09/15/2022    HIVRS Detected (A) 09/15/2022    HIVCP <20 (H) 09/15/2022     CD4, HIV RNA, and safety labs (full return panel)  Continue current therapy. Started on Genvoya on 11/19/21  Discussed importance of ARV adherence  Would like to start Cabenuva. Discussed benefits/risks of treatment with injectable modality.   Patient lives in [place] now and is willing to drive to [facility] every 2 months for injections.   Has new insurance for 09/20/2022 and will notify us of the new policy, she does not know as of yet. Stressed importance of letting us know as I do not want her to have any interruption in ART.    Cabenuva Discussion  During the visit today, I discussed with the patient the risks and benefits of Cabenuva treatment and the schedule of injections. she have verbalized understanding of this discussion and would like to pursue treatment every two months.  Current ART regimen: Genvoya  I confirm that the patient fulfills the eligibility requirements below:  HIV positive: yes  Suppressed viral load for at least 3 months: No  Last viral load on pending from 6/13  Prior tolerance to INSTI and NNRTI: Yes  No prior virologic failures (no prior INSTI or NNRTI resistance):  Yes. Current Genotype from 02/2021  Do not have active hepatitis B virus infection, unless also receiving an oral HBV active regimen:  No. She is Hepatitis B immune  Are not pregnant or planning to become pregnant:  Yes  Agrees to be seen for monthly or bimonthly injections:  Yes  Patient education includes:  Importance of consistently attending injection appointments and follow up appointments  Having reliable transportation for appointments   Having reliable way to communicate with clinic and for clinic staff to be able to contact patient if need be  Willingness to receive one moderate volume injection in each gluteal each treatment visit  Discussed any upcoming travel or consistent travel (as for work, etc.)  Discussed any anticipated changes to insurance/coverage plan.  Agrees to sign texting agreement and enroll in MyChart in order to facilitate communication and appointment reminders.  Opting for oral lead-in?  No  Has been on LAI treatment in the past?  No      Urinary frequency with history of overactive bladder (last addressed 04/2021)  Has not been taking Ditropan XL.  Symptoms started back about 6 days ago. Thought it was a UTI  Obtain UA with culture today.  If UA negative, restart Ditropan.  If +  UTI, will treat with Macrobid 100mg  BID x 7 days.  UA negative - Sending script for Ditropan XL to local [facility] in [place] at patient's request. - Patient aware and will pick up medication.      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  Patient also has oral lesions, denies any genital lesions.  On suppression. RX sent for Valtrex 1 gram daily.      History of CoVid infection (02/2019) - acute, self-limited  Symptoms included: Fever, body aches. Denies loss of taste or smell, coughing, GI complaints  Symptoms resolved.  CoVid BV booster today. Chronic conditions managed by PCP, Arby Barrette, PA-C:  Patient has not been able to have appointment with PCP and running out of medications. I have sent in 3 months supply of medications to bridge until her next appointment with PCP. She states she will call to make appointment.  Hypertension  - chronic, stable - 121/87 today, Continue Amlodipine   Depression  - chronic, stable -  On Effexor   BMI 40.61 - chronic - Working on diet and exercise. Would like a tummy tuck in the future once she loses weight. Will need to keep in mind, weight neutral ART regimen if needing to change ART      Sexual health & secondary prevention  - chronic, stable  Not in relationship.  LSE end of July 2022  Parts of body used during sex include: mouth and vagina. Does not have anal sex. Gives and receives oral sex. Receptive partner for vaginal sex.   In past 6 months has had no sex and has not had add'l STI screening.  She  uses condoms  She does routinely discuss HIV status with partner(s).  Have not discussed interest in having children.    Lab Results   Component Value Date    RPR Nonreactive 03/02/2022    RPR Nonreactive 10/30/2021     GC/CT NAATs - patient declined screening  RPR - repeat 12 months after prior, due 02/2023      Health maintenance  - chronic, stable  PCP: Julieanne Cotton, PA-C    Oral health  She does  have a dentist. Last dental exam 09/2020.    Eye health  She does  use corrective lenses. Last eye exam 08/2020. Early glaucoma    Metabolic conditions  Wt Readings from Last 5 Encounters:   01/10/23 (!) 121.1 kg (267 lb)   09/15/22 (!) 121.1 kg (267 lb)   03/02/22 (!) 115.7 kg (255 lb)   03/02/22 (!) 116 kg (255 lb 12.8 oz)   10/30/21 (!) 117 kg (258 lb)     Lab Results   Component Value Date    CREATININE 0.80 09/15/2022    GLUCOSEU Negative 09/15/2022    GLU 106 09/15/2022    A1C 5.8 (H) 10/30/2021    ALT 19 09/15/2022    ALT 27 03/02/2022    ALT 20 10/30/2021     # Kidney health - creatinine today  # Bone health - FRAX score major osteoporotic 2.1%/Hip fracture 0.1%% on this date: 02/13/21  # Diabetes assessment - random glucose today  # NAFLD assessment - monitor over time    Communicable diseases  Lab Results   Component Value Date    HEPAIGG Reactive (A) 04/24/2021    HEPBSAB Reactive (A) 04/24/2021     # TB screening - no longer needed; negative IGRA, low risk 01/04/2019  # Hepatitis screening -  as noted:  (07/21/20) HepB sAg NR, HepC Ab NR, (04/24/21) HepB sAb REACTIVE, HepB cAb NR, HepA REACTIVE Hep A/B IMMUNE  # MMR screening -  01/04/19 Measles immune    Cancer screening  No results found for: PSASCRN, PSA, PAP, FINALDX  # Cervical -  Managed by GYN  # Breast -  Managed by GYN    # Anorectal -  Needs anal pap , declines today  # Colorectal -  Defer to PCP  # Liver - no screening indicated  # Lung - screening not indicated    Cardiovascular disease  Lab Results   Component Value Date    CHOL 141 04/24/2021    HDL 54 04/24/2021    LDL 61 04/24/2021    NONHDL 87 04/24/2021    TRIG 161 04/24/2021     # The 10-year ASCVD risk score (Arnett DK, et al., 2019) is: 3.1%  - is not taking aspirin   - is not taking statin  - BP control good  - never smoker  # AAA screening - no indication for screening    Immunization History   Administered Date(s) Administered    COVID-19 VAC,BIVALENT,MODERNA(BLUE CAP) 03/02/2022    COVID-19 VACC,MRNA(BOOSTER)OR(6-9YR)MODERNA 02/11/2021    COVID-19 VACCINE,MRNA(MODERNA)(PF) 02/13/2020, 03/12/2020    Covid-19 Vac, (91yr+) (Spikevax) Monovalent Xbb.1.5 Moder  09/15/2022    DTaP, Unspecified Formulation 03/09/1966, 04/08/1966, 05/10/1966, 05/17/1968    Hepatitis B vaccine, pediatric/adolescent dosage, 05/17/2003, 06/18/2003, 11/22/2003    INFLUENZA TIV (TRI) 77MO+ W/ PRESERV (IM) 09/27/2011, 06/07/2012, 07/20/2012, 06/08/2013, 06/03/2015, 08/06/2019    Influenza Vaccine Quad (IIV4 W/PRESERV) 77MO+ 09/27/2011, 06/07/2012, 06/08/2013, 06/03/2015, 08/06/2019    Influenza Vaccine Quad(IM)6 MO-Adult(PF) 10/30/2021, 09/15/2022    Influenza Virus Vaccine, unspecified formulation 09/27/2011, 06/07/2012, 07/20/2012, 06/08/2013, 06/03/2015    Measles 04/12/1969    Pneumococcal Conjugate 20-valent 10/30/2021     Immunizations today - COVID BV Moderna and flu vaccine  Needs Shingles, Menveo      I personally spent 40 minutes face-to-face and non-face-to-face in the care of this patient, which includes all pre, intra, and post visit time on the date of service.  All documented time was specific to the E/M visit and does not include any procedures that may have been performed.        Disposition  Next appointment: 5-6 months      To do @ next RTC  Anal pap - next visit      Varney Daily, FNP-BC  [facility] at [facility]  [address] [place], [place] [address]    Phone: [phone]   Fax: [phone]           Subjective      Chief Complaint   Routine HIV follow up    HPI  In addition to details in A&P above:  Denies any fever, chills, nausea, vomiting, rash, urinary complaints, diarrhea, constipation.  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  Has been under a lot stress  Has been with boyfreind who is chronically ill and has cancer - Stage 4 lung and renal cancer, COPD, DM  No sex x 1year.  Has Daughter and 2 grandsons living with her, living in 1 bedroom apartment very stressful  Would like to be referred for therapy services.    Interim History 03/02/22:  Working with  dietician (local), stress eating., emotional eater.  May be interested in Weight management clinic if she can have virtual visits  Just had a breakup this week or last week. Dealing with it well though it has made her lose her faith in men.  Daughter and 2 grandchildren continues to be a source of stress.  Has been working as a Midwife for [employer]  Has been trying to get away from her kids but they keep following her.  Waiting on getting a therapist till she moves back to [place]. She does not want to see anyone before that.  Decreased sex drive so not interested in sex but wants companionship, hard to find someone who shares this sentiment.  Has early glaucoma, plans to see eye doctor.    Interim History 09/15/22:  Has urine odor, frequent urination x 6 days, lower back pain x 1 day  No N/V, fever, chills, no dysuria  In [place] now, since September 12 living with oldest daughter  In better mental status. Had lived with her youngest daughter in [place], couldn't get along. She is now more at peace and is in a less stressful environment.  Didn't like living in [place].  Wants to lose weight and get tummy tuck down the road.       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    Social History     Tobacco Use    Smoking status: Never    Smokeless tobacco: Never   Substance Use Topics    Alcohol use: Not Currently     Comment: rarely, once a year    Drug use: Never       Review of Systems  As per HPI. All others negative.      Medications and Allergies  She has a current medication list which includes the following prescription(s): amlodipine, genvoya, oxybutynin, polyethylene glycol, simethicone, valacyclovir, and venlafaxine.    Allergies: Patient has no known allergies.      Family History  Her family history includes Asthma in her maternal aunt and maternal grandmother; COPD in her mother; Cancer in her maternal aunt, maternal grandmother, and son; Depression in her mother; Diabetes in her brother and father; Heart disease in her maternal aunt and mother; Hypertension in her brother, father, and mother; Kidney disease in her paternal aunt and paternal uncle.              Objective:      There were no vitals taken for this visit.  Wt Readings from Last 3 Encounters:   01/10/23 (!) 121.1 kg (267 lb)   09/15/22 (!) 121.1 kg (267 lb)   03/02/22 (!) 115.7 kg (255 lb)       Const looks well and attentive, alert, appropriate   Eyes sclerae anicteric, noninjected OU   ENT no thrush, leukoplakia or oral lesions   Lymph no cervical or supraclavicular LAD   CV RRR. No murmurs. No rub or gallop. S1/S2.   Lungs CTAB ant/post, normal work of breathing   GI Soft, no organomegaly. NTND. NABS.   GU deferred No CVA tenderness   Rectal deferred   Skin no petechiae, ecchymoses or obvious rashes on clothed exam   MSK no joint tenderness and normal  ROM throughout   Neuro grossly intact   Psych Appropriate affect. Eye contact good. Linear thoughts. Fluent speech.     Laboratory Data  Reviewed in Epic today, using Synopsis and Chart Review filters.    Lab Results   Component Value Date    CREATININE 0.80 09/15/2022    CHOL 141 04/24/2021    HDL 54 04/24/2021    LDL 61 04/24/2021    NONHDL 87 04/24/2021    TRIG 109 04/24/2021    A1C 5.8 (H) 10/30/2021

## 2023-04-07 DIAGNOSIS — I10 Essential (primary) hypertension: Principal | ICD-10-CM

## 2023-04-07 DIAGNOSIS — R140 Abdominal distension (gaseous): Principal | ICD-10-CM

## 2023-04-07 DIAGNOSIS — F32A Depression, unspecified depression type: Principal | ICD-10-CM

## 2023-04-07 DIAGNOSIS — K5900 Constipation, unspecified: Principal | ICD-10-CM

## 2023-04-07 DIAGNOSIS — K219 Gastro-esophageal reflux disease without esophagitis: Principal | ICD-10-CM

## 2023-04-07 MED ORDER — AMLODIPINE 5 MG TABLET
ORAL_TABLET | Freq: Every day | ORAL | 0 refills | 90 days | Status: CN
Start: 2023-04-07 — End: ?

## 2023-04-07 MED ORDER — PANTOPRAZOLE 40 MG TABLET,DELAYED RELEASE
ORAL_TABLET | Freq: Every day | ORAL | 0 refills | 30 days | Status: CN
Start: 2023-04-07 — End: 2023-05-07

## 2023-04-07 MED ORDER — VENLAFAXINE ER 150 MG CAPSULE,EXTENDED RELEASE 24 HR
ORAL_CAPSULE | Freq: Every day | ORAL | 0 refills | 90 days
Start: 2023-04-07 — End: ?

## 2023-04-07 NOTE — Unmapped (Signed)
[facility] Refill Coordination Note    Specialty Medication(s) to be Shipped:   Infectious Disease: Genvoya    Other medication(s) to be shipped:  amlodipine, valacyclovir, venlafaxine, and pantoprazole     Karen Strickland, DOB: 12/06/1965  Phone: [phone] (home)       All above HIPAA information was verified with patient.     Was a Nurse, learning disability used for this call? No    Completed refill call assessment today to schedule patient's medication shipment from the [facility] [phone]).  All relevant notes have been reviewed.     Specialty medication(s) and dose(s) confirmed: Regimen is correct and unchanged.   Changes to medications: Adely reports no changes at this time.  Changes to insurance: No  New side effects reported not previously addressed with a pharmacist or physician: None reported  Questions for the pharmacist: No    Confirmed patient received a Conservation officer, historic buildings and a Surveyor, mining with first shipment. The patient will receive a drug information handout for each medication shipped and additional FDA Medication Guides as required.       DISEASE/MEDICATION-SPECIFIC INFORMATION        N/A    SPECIALTY MEDICATION ADHERENCE     Medication Adherence    Patient reported X missed doses in the last month: 0  Specialty Medication: GENVOYA 150-150-200-10 mg Tab tablet (elvitegravir-cobicistat-emtricitabine-tenofovir)  Patient is on additional specialty medications: No  Patient is on more than two specialty medications: No  Any gaps in refill history greater than 2 weeks in the last 3 months: no  Demonstrates understanding of importance of adherence: yes  Informant: patient  Confirmed plan for next specialty medication refill: delivery by pharmacy  Refills needed for supportive medications: not needed          Refill Coordination    Has the Patients' Contact Information Changed: No  Is the Shipping Address Different: No         Were doses missed due to medication being on hold? No    GENVOYA 150-150-200-10   mg: 5 days of medicine on hand       REFERRAL TO PHARMACIST     Referral to the pharmacist: Not needed      [facility]     Shipping address confirmed in Epic.       Delivery Scheduled: Yes, Expected medication delivery date: 04/12/2023.     Medication will be delivered via UPS to the prescription address in Epic WAM.    [name]   [facility] Specialty Technician

## 2023-04-08 NOTE — Unmapped (Signed)
Routing to her PCP for refills, pt is not under my care I prescribed this at an acute care visit

## 2023-04-11 DIAGNOSIS — I10 Essential (primary) hypertension: Principal | ICD-10-CM

## 2023-04-11 DIAGNOSIS — K5900 Constipation, unspecified: Principal | ICD-10-CM

## 2023-04-11 DIAGNOSIS — F32A Depression, unspecified depression type: Principal | ICD-10-CM

## 2023-04-11 DIAGNOSIS — K219 Gastro-esophageal reflux disease without esophagitis: Principal | ICD-10-CM

## 2023-04-11 DIAGNOSIS — R140 Abdominal distension (gaseous): Principal | ICD-10-CM

## 2023-04-11 MED ORDER — VENLAFAXINE ER 150 MG CAPSULE,EXTENDED RELEASE 24 HR
ORAL_CAPSULE | Freq: Every day | ORAL | 0 refills | 30 days | Status: CP
Start: 2023-04-11 — End: ?
  Filled 2023-04-12: qty 30, 30d supply, fill #0

## 2023-04-11 MED ORDER — PANTOPRAZOLE 40 MG TABLET,DELAYED RELEASE
ORAL_TABLET | Freq: Every day | ORAL | 0 refills | 30 days | Status: CP
Start: 2023-04-11 — End: 2024-04-10
  Filled 2023-04-12: qty 30, 30d supply, fill #0

## 2023-04-11 MED ORDER — AMLODIPINE 5 MG TABLET
ORAL | 0 refills | 30 days | Status: CP
Start: 2023-04-11 — End: ?
  Filled 2023-04-12: qty 15, 30d supply, fill #0

## 2023-04-11 NOTE — Unmapped (Signed)
Medication Requested: Amlodipine 5 mg tab & venlafaxine XR 150 mg cap    Last visit: 09/15/2022    No future appointments.  Per Provider Note:   Hypertension - chronic, stable - 121/87 today, Continue Amlodipine   Depression - chronic, stable - On Effexor     Standing order protocol requirements met?: Yes    Sent to: Pharmacy per protocol    Days Supply Given: 30 days  Number of Refills: 0

## 2023-04-11 NOTE — Unmapped (Signed)
Addended by: Salli Quarry B on: 04/11/2023 10:33 AM     Modules accepted: Orders

## 2023-04-12 MED FILL — GENVOYA 150 MG-150 MG-200 MG-10 MG TABLET: ORAL | 30 days supply | Qty: 30 | Fill #6

## 2023-04-12 MED FILL — VALACYCLOVIR 1 GRAM TABLET: ORAL | 30 days supply | Qty: 30 | Fill #6

## 2023-05-03 ENCOUNTER — Ambulatory Visit: Admit: 2023-05-03 | Discharge: 2023-05-03 | Payer: PRIVATE HEALTH INSURANCE | Attending: Family | Primary: Family

## 2023-05-03 DIAGNOSIS — B20 Human immunodeficiency virus [HIV] disease: Principal | ICD-10-CM

## 2023-05-03 DIAGNOSIS — Z6841 Body Mass Index (BMI) 40.0 and over, adult: Principal | ICD-10-CM

## 2023-05-03 DIAGNOSIS — F32A Anxiety and depression: Principal | ICD-10-CM

## 2023-05-03 DIAGNOSIS — Z79899 Other long term (current) drug therapy: Principal | ICD-10-CM

## 2023-05-03 DIAGNOSIS — Z9189 Other specified personal risk factors, not elsewhere classified: Principal | ICD-10-CM

## 2023-05-03 DIAGNOSIS — Z5181 Encounter for therapeutic drug level monitoring: Principal | ICD-10-CM

## 2023-05-03 DIAGNOSIS — A6000 Herpesviral infection of urogenital system, unspecified: Principal | ICD-10-CM

## 2023-05-03 DIAGNOSIS — I10 Essential (primary) hypertension: Principal | ICD-10-CM

## 2023-05-03 DIAGNOSIS — Z113 Encounter for screening for infections with a predominantly sexual mode of transmission: Principal | ICD-10-CM

## 2023-05-03 DIAGNOSIS — F419 Anxiety disorder, unspecified: Principal | ICD-10-CM

## 2023-05-03 DIAGNOSIS — Z13220 Encounter for screening for lipoid disorders: Principal | ICD-10-CM

## 2023-05-03 DIAGNOSIS — F4321 Adjustment disorder with depressed mood: Principal | ICD-10-CM

## 2023-05-03 LAB — CBC W/ AUTO DIFF
BASOPHILS ABSOLUTE COUNT: 0 10*9/L (ref 0.0–0.1)
BASOPHILS RELATIVE PERCENT: 0.7 %
EOSINOPHILS ABSOLUTE COUNT: 0.1 10*9/L (ref 0.0–0.5)
EOSINOPHILS RELATIVE PERCENT: 1 %
HEMATOCRIT: 38.3 % (ref 34.0–44.0)
HEMOGLOBIN: 12.3 g/dL (ref 11.3–14.9)
LYMPHOCYTES ABSOLUTE COUNT: 1.8 10*9/L (ref 1.1–3.6)
LYMPHOCYTES RELATIVE PERCENT: 27.4 %
MEAN CORPUSCULAR HEMOGLOBIN CONC: 32 g/dL (ref 32.0–36.0)
MEAN CORPUSCULAR HEMOGLOBIN: 28.9 pg (ref 25.9–32.4)
MEAN CORPUSCULAR VOLUME: 90.1 fL (ref 77.6–95.7)
MEAN PLATELET VOLUME: 8.2 fL (ref 6.8–10.7)
MONOCYTES ABSOLUTE COUNT: 0.4 10*9/L (ref 0.3–0.8)
MONOCYTES RELATIVE PERCENT: 6.1 %
NEUTROPHILS ABSOLUTE COUNT: 4.2 10*9/L (ref 1.8–7.8)
NEUTROPHILS RELATIVE PERCENT: 64.8 %
PLATELET COUNT: 404 10*9/L (ref 150–450)
RED BLOOD CELL COUNT: 4.25 10*12/L (ref 3.95–5.13)
RED CELL DISTRIBUTION WIDTH: 14.5 % (ref 12.2–15.2)
WBC ADJUSTED: 6.5 10*9/L (ref 3.6–11.2)

## 2023-05-03 LAB — LIPID PANEL
CHOLESTEROL/HDL RATIO SCREEN: 2.6 (ref 1.0–4.5)
CHOLESTEROL: 146 mg/dL (ref <=200)
HDL CHOLESTEROL: 57 mg/dL (ref 40–60)
LDL CHOLESTEROL CALCULATED: 57 mg/dL (ref 40–99)
NON-HDL CHOLESTEROL: 89 mg/dL (ref 70–130)
TRIGLYCERIDES: 159 mg/dL — ABNORMAL HIGH (ref 0–150)
VLDL CHOLESTEROL CAL: 31.8 mg/dL (ref 11–40)

## 2023-05-03 LAB — HEMOGLOBIN A1C
ESTIMATED AVERAGE GLUCOSE: 123 mg/dL
HEMOGLOBIN A1C: 5.9 % — ABNORMAL HIGH (ref 4.8–5.6)

## 2023-05-03 LAB — BASIC METABOLIC PANEL
ANION GAP: 8 mmol/L (ref 5–14)
BLOOD UREA NITROGEN: 15 mg/dL (ref 9–23)
BUN / CREAT RATIO: 14
CALCIUM: 10 mg/dL (ref 8.7–10.4)
CHLORIDE: 103 mmol/L (ref 98–107)
CO2: 29.9 mmol/L (ref 20.0–31.0)
CREATININE: 1.05 mg/dL — ABNORMAL HIGH
EGFR CKD-EPI (2021) FEMALE: 61 mL/min/{1.73_m2} (ref >=60)
GLUCOSE RANDOM: 115 mg/dL (ref 70–179)
POTASSIUM: 3.2 mmol/L — ABNORMAL LOW (ref 3.4–4.8)
SODIUM: 141 mmol/L (ref 135–145)

## 2023-05-03 LAB — AST: AST (SGOT): 22 U/L (ref <=34)

## 2023-05-03 LAB — BILIRUBIN, TOTAL: BILIRUBIN TOTAL: 0.3 mg/dL (ref 0.3–1.2)

## 2023-05-03 LAB — ALT: ALT (SGPT): 24 U/L (ref 10–49)

## 2023-05-03 MED ORDER — VALACYCLOVIR 1 GRAM TABLET
ORAL_TABLET | Freq: Every day | ORAL | 3 refills | 90 days | Status: CP
Start: 2023-05-03 — End: ?
  Filled 2023-05-19: qty 30, 30d supply, fill #0

## 2023-05-03 MED ORDER — GENVOYA 150 MG-150 MG-200 MG-10 MG TABLET
ORAL_TABLET | Freq: Every day | ORAL | 3 refills | 90 days | Status: CP
Start: 2023-05-03 — End: ?
  Filled 2023-05-19: qty 30, 30d supply, fill #0

## 2023-05-03 NOTE — Unmapped (Signed)
PROMIS Tablet Screening  Completed Date: 05/03/2023     SW reviewed self-administered screening.    Patient had a PHQ-9 score of 18  indicating Moderately Severe depression.   Pt endorses SI. See provider note and/or social work note/P4 Screener for additional information. SW cannot assess for PRO flags at this time due to lack of clinical supervisor. SW manager asked pts provider to assess for SI.  Pt scored 0 on AUDIT/AUDIT-C indicating Not-at-risk alcohol consumption  Pt  denies substance use in past 3 months.  Pt denies concerns for IPV.    Pt seen by provider for regular ID visit.  Pt was seen in person by Social Work during this visit.          Pattricia Boss Kaheem Halleck, [employer]

## 2023-05-03 NOTE — Unmapped (Signed)
Name: Karen Strickland  Date: 05/03/2023  Address: [address]  [place] [address]   [place] of Residence:  [place]  Phone: [phone]     Started assessment with patient options: in clinic     Is this the same address for mailing? Yes  If No, Mailing Address is:     Psychiatric nurse and Private - Individual     Marital Status  Single    Tax Press photographer Status  I did not file taxes     Employment Status  Employed Full Time    Income  Salary/Wages    If no or low income, how are you meeting your basic needs?  Not Applicable    List Tax Household Members including relationship to you:   N/A    Someone in my household receives: Not Applicable (for household members)  Specify who: N/A    Medication Access/Barriers: None, needs to fill with umr insurane after cancelling aetna oon,    Do you have a current diagnosis for Hepatitis C?  No results found for: HEPCAB, HCVR, HCVRNA, HCVIU, HCVRNAIU, HCVGENOTYPE    Have you used tobacco products four or more times per week in the last six months?  No    Teacher, adult education  Patient has affordable insurance through [employer], [place], and or Employment and is not eligible.    Patient given ACA education if they qualified based on answers to questions above.     MyChart  Do you have an active MyChart account? Yes     If MyChart is not set up, informed patient on how to set up MyChart N/A    Patient was informed of the following programs;   N/A    The following applications/handouts were given to patient:   N/A    The following forms were also started with the patient:   N/A    Medicaid:  N/A      Juanell Fairly application status: Complete    Patient is applying for [employer] on Charges Only     Additional Comments:       Mickle Asper,  Benefits & Eligibility Coordinator  Time of Intervention: 5 minutes

## 2023-05-03 NOTE — Unmapped (Signed)
Referral Services Note     Duration of Intervention: 15 minutes    TYPE OF CONTACT: MyChart    ASSESSMENT:  SW sent therapist information for Aetna and UMR via MyChart per provder request.     INTERVENTION:  SW sent the following in-network therapists in pts area:      Barbera Setters   [place], [place] [phone] [address]   [place], [place] [address](528) [phone]       Gilford Rile [place], [place] [phone]         PLAN:  Pt will follow up with resources provided.         Pattricia Boss Reason Helzer, [employer]

## 2023-05-03 NOTE — Unmapped (Signed)
INFECTIOUS DISEASES CLINIC  [address]  [place], [place]  [address]  P [phone]  F [phone]     Primary care provider: Julieanne Cotton, PA      Assessment/Plan:      HIV (dx'd 2004)  - chronic, stable  Health Department came to her home and told she needed testing.  Participated in study that started her on Edurant(RPV) and Truvada(FTC/TDF)-equivalent to Complera  Started on Odefsy which is covered on current insurance plan in 01/2021. (Patient's insurance did not cover for Complera. Odefsy is equivalent, but has TAF rather than TDF. Patient amenable to starting this.)    Overall doing well. Current regimen: Genvoya (EVG/c/FTC/TAF)   Misses doses of ARVs never     Med access through insurance  CD4 count  see below  Discussed ARV adherence and taking ARVs with food    Lab Results   Component Value Date    ACD4 855 09/15/2022    CD4 57 09/15/2022    HIVRS Detected (A) 09/15/2022    HIVCP <20 (H) 09/15/2022     CD4, HIV RNA, and safety labs (full return panel)  Continue current therapy. Started on Genvoya on 11/19/21  Discussed importance of ARV adherence  Previously discussed Cabenuva with patient and she is willing to come to clinic for injections but patient recently has canceled multiple appointments with me and has exacerbated depression and anxiety that could be a barrier to consistent attendance of appointments.   Plan is to have patient establish with psychiatry and therapy and once this is more stable, will revisit Cabenuva start. Patient agrees with plan.  Once she is a good candidate for Cabenuva, will need monitoring with PHQ9.      Mental Health - chronic, exacerbated  PHQ-9 19, P4 - minimal  Exacerbated depression and anxiety.  See HPI  SW sent resources to patient for therapy and psychiatry  Urgent referral sent to [facility] (non-ID)  Assisted patient in finding potential therapist, she will call her asap.  Informed patient of 23 resources if she is in crisis - she agrees to call or text them if needed.  Denies any SI.      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  Patient also has oral lesions, denies any genital lesions.  On suppression. RX sent for Valtrex 1 gram daily. - refills provided      History of CoVid infection (02/2019) - acute, self-limited  Symptoms included: Fever, body aches. Denies loss of taste or smell, coughing, GI complaints  Symptoms resolved.      Chronic conditions managed by PCP, Arby Barrette, PA-C:  Patient has not been able to have appointment with PCP and running out of medications. I have sent in 3 months supply of medications to bridge until her next appointment with PCP. She states she will call to make appointment.  Hypertension  - chronic, stable - 121/87 today, Continue Amlodipine   Depression  - chronic, stable -  On Effexor   BMI 40.61 - chronic - Working on diet and exercise. Would like a tummy tuck in the future once she loses weight. Will need to keep in mind, weight neutral ART regimen if needing to change ART      Sexual health & secondary prevention  - chronic, stable  Not in relationship.  LSE end of July 2022 - no sex since last visit  Parts of body used during sex include:  mouth and vagina. Does not have anal sex. Gives and receives oral sex. Receptive partner for vaginal sex.   In past 6 months has had no sex and has not had add'l STI screening.  She  uses condoms  She does routinely discuss HIV status with partner(s).  Have not discussed interest in having children.    Lab Results   Component Value Date    RPR Nonreactive 03/02/2022    RPR Nonreactive 10/30/2021     GC/CT NAATs - patient declined screening  RPR - for screening obtained today      Health maintenance  - chronic, stable  PCP: Julieanne Cotton, PA-C   Patient needs appointment. I stressed importance of following up with PCP.    Oral health  She does  have a dentist. Last dental exam 09/2020.    Eye health  She does  use corrective lenses. Last eye exam 08/2020. Early glaucoma    Metabolic conditions  Wt Readings from Last 5 Encounters:   05/03/23 (!) 123.4 kg (272 lb)   01/10/23 (!) 121.1 kg (267 lb)   09/15/22 (!) 121.1 kg (267 lb)   03/02/22 (!) 115.7 kg (255 lb)   03/02/22 (!) 116 kg (255 lb 12.8 oz)     Lab Results   Component Value Date    CREATININE 1.05 (H) 05/03/2023    GLUCOSEU Negative 09/15/2022    GLU 115 05/03/2023    A1C 5.9 (H) 05/03/2023    ALT 24 05/03/2023    ALT 19 09/15/2022    ALT 27 03/02/2022     # Kidney health - creatinine today  # Bone health - FRAX score major osteoporotic 2.1%/Hip fracture 0.1%% on this date: 02/13/21  # Diabetes assessment - random glucose today  # NAFLD assessment - monitor over time    Communicable diseases  Lab Results   Component Value Date    HEPAIGG Reactive (A) 04/24/2021    HEPBSAB Reactive (A) 04/24/2021     # TB screening - no longer needed; negative IGRA, low risk 01/04/2019  # Hepatitis screening -  as noted:  (07/21/20) HepB sAg NR, HepC Ab NR, (04/24/21) HepB sAb REACTIVE, HepB cAb NR, HepA REACTIVE Hep A/B IMMUNE  # MMR screening -  01/04/19 Measles immune    Cancer screening  No results found for: PSASCRN, PSA, PAP, FINALDX  # Cervical -  Managed by GYN  # Breast -  Managed by GYN    # Anorectal -  Needs anal pap , declines today  # Colorectal -  Defer to PCP  # Liver - no screening indicated  # Lung - screening not indicated    Cardiovascular disease  Lab Results   Component Value Date    CHOL 146 05/03/2023    HDL 57 05/03/2023    LDL 57 05/03/2023    NONHDL 89 05/03/2023    TRIG 161 (H) 05/03/2023     # The 10-year ASCVD risk score (Arnett DK, et al., 2019) is: 2.6%  - is not taking aspirin   - is not taking statin  - BP control good  - never smoker  # AAA screening - no indication for screening    Immunization History   Administered Date(s) Administered    COVID-19 VAC,BIVALENT,MODERNA(BLUE CAP) 03/02/2022    COVID-19 VACC,MRNA(BOOSTER)OR(6-30YR)MODERNA 02/11/2021    COVID-19 VACCINE,MRNA(MODERNA)(PF) 02/13/2020, 03/12/2020    Covid-19 Vac, (5yr+) (Spikevax) Monovalent Xbb.1.5 Moder  09/15/2022    DTaP, Unspecified Formulation 03/09/1966, 04/08/1966, 05/10/1966, 05/17/1968  Hepatitis B vaccine, pediatric/adolescent dosage, 05/17/2003, 06/18/2003, 11/22/2003    INFLUENZA TIV (TRI) 476MO+ W/ PRESERV (IM) 09/27/2011, 06/07/2012, 07/20/2012, 06/08/2013, 06/03/2015, 08/06/2019    Influenza Vaccine Quad (IIV4 W/PRESERV) 476MO+ 09/27/2011, 06/07/2012, 06/08/2013, 06/03/2015, 08/06/2019    Influenza Vaccine Quad(IM)6 MO-Adult(PF) 10/30/2021, 09/15/2022    Influenza Virus Vaccine, unspecified formulation 09/27/2011, 06/07/2012, 07/20/2012, 06/08/2013, 06/03/2015    Measles 04/12/1969    Pneumococcal Conjugate 20-valent 10/30/2021     Immunizations today - no immunizations needed today but will need updated CoVid and flu vaccine at next visit.  Needs Shingles, Menveo      I personally spent 60 minutes face-to-face and non-face-to-face in the care of this patient, which includes all pre, intra, and post visit time on the date of service.  All documented time was specific to the E/M visit and does not include any procedures that may have been performed.        Disposition  Next appointment: 3 months      To do @ next RTC  Anal pap - next visit  Flu and CoVid  Established with Psych and Therapy?      Varney Daily, FNP-BC  [facility] at [facility]  [address] [place], [place] [address]    Phone: [phone]   Fax: [phone]           Subjective      Chief Complaint   Routine HIV follow up    HPI  In addition to details in A&P above:  Denies any fever, chills, nausea, vomiting, rash, urinary complaints, diarrhea, constipation.  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  Has been under a lot stress  Has been with boyfreind who is chronically ill and has cancer - Stage 4 lung and renal cancer, COPD, DM  No sex x 1year.  Has Daughter and 2 grandsons living with her, living in 1 bedroom apartment very stressful  Would like to be referred for therapy services.    Interim History 03/02/22:  Working with dietician (local), stress eating., emotional eater.  May be interested in Weight management clinic if she can have virtual visits  Just had a breakup this week or last week. Dealing with it well though it has made her lose her faith in men.  Daughter and 2 grandchildren continues to be a source of stress.  Has been working as a Midwife for [employer]  Has been trying to get away from her kids but they keep following her.  Waiting on getting a therapist till she moves back to [place]. She does not want to see anyone before that.  Decreased sex drive so not interested in sex but wants companionship, hard to find someone who shares this sentiment.  Has early glaucoma, plans to see eye doctor.    Interim History 09/15/22:  Has urine odor, frequent urination x 6 days, lower back pain x 1 day  No N/V, fever, chills, no dysuria  In [place] now, since September 12 living with oldest daughter  In better mental status. Had lived with her youngest daughter in [place], couldn't get along. She is now more at peace and is in a less stressful environment.  Didn't like living in [place].  Wants to lose weight and get tummy tuck down the road.     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 Would like to stay there at least 5-6 years in order for her to retire.  Depression and anxiety are exacerbated for the following reasons:  Work is stressful, dealing with colleagues and her boss, politics in the workplace  Lives with her daughter and grandkids. She is seeing their behavior and feels that she failed with them in some way  Feels her age and wants to find a partner (hasn't been dating)  Has unresolved trauma from childhood and grief.  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 All others negative.      Medications and Allergies  She has a current medication list which includes the following prescription(s): amlodipine, oxybutynin, pantoprazole, polyethylene glycol, simethicone, venlafaxine, genvoya, and valacyclovir.    Allergies: Patient has no known allergies.      Family History  Her family history includes Asthma in her maternal aunt and maternal grandmother; COPD in her mother; Cancer in her maternal aunt, maternal grandmother, and son; Depression in her mother; Diabetes in her brother and father; Heart disease in her maternal aunt and mother; Hypertension in her brother, father, and mother; Kidney disease in her paternal aunt and paternal uncle.              Objective:      BP 118/87 (BP Site: L Arm, BP Position: Sitting, BP Cuff Size: X-Large)  - Pulse 89  - Temp 36.1 ??C (97 ??F) (Temporal)  - Ht 172.7 cm (5' 7.99)  - Wt (!) 123.4 kg (272 lb)  - BMI 41.37 kg/m??   Wt Readings from Last 3 Encounters:   05/03/23 (!) 123.4 kg (272 lb)   01/10/23 (!) 121.1 kg (267 lb)   09/15/22 (!) 121.1 kg (267 lb)       Const looks well and attentive, alert, appropriate   Eyes sclerae anicteric, noninjected OU   ENT no thrush, leukoplakia or oral lesions   Lymph no cervical or supraclavicular LAD   CV RRR. No murmurs. No rub or gallop. S1/S2.   Lungs CTAB ant/post, normal work of breathing   GI Soft, no organomegaly. NTND. NABS.   GU deferred No CVA tenderness   Rectal deferred   Skin no petechiae, ecchymoses or obvious rashes on clothed exam   MSK no joint tenderness and normal ROM throughout   Neuro grossly intact   Psych Appropriate affect. Eye contact good. Linear thoughts. Fluent speech.     Laboratory Data  Reviewed in Epic today, using Synopsis and Chart Review filters.    Lab Results   Component Value Date    CREATININE 1.05 (H) 05/03/2023    CHOL 146 05/03/2023    HDL 57 05/03/2023    LDL 57 05/03/2023    NONHDL 89 05/03/2023    TRIG 109 (H) 05/03/2023    A1C  5.9 (H) 05/03/2023

## 2023-05-03 NOTE — Unmapped (Addendum)
COVID Education:  Make sure you perform good hand washing (lasting 20 seconds), continue to social distance and limit close personal contact (which may include new sexual partners or having multiple partners during this period).  Try to isolate at home but please find ways to keep in touch with those close to you, such as meeting up with them electronically or socially distanced, and the ability to go outdoors alone or separated from others  If you become ill with fever, respiratory illness, sudden loss of taste and smell, stomach issues, diarrhea, nausea, vomiting - contact clinic for further instructions.  You should go to the emergency department if you develop systems such as shortness of breath, confusion, lightheadedness when standing, high fever.   Here is some information about HIV and CoVid vaccines: [email or web address]  If you're interested in receiving the CoVid vaccine when you're eligible, here are some resources for you to check and make an appointment:  Your local health department   [email or web address] through [facility]  [email or web address]  [facility] (if you are an established patient with them)  [email or web address]    URGENT CARE  Please call ahead to speak with the nursing staff if you are in need of an urgent appointment.       MEDICATIONS  For refills please contact your pharmacy and ask them to electronically send or fax the request to the clinic.   Please bring all medications in original bottles to every appointment.    HMAP (formerly ADAP) or [employer] Eligibility (required even if you do not receive medication through [facility])  Please remember to renew your Juanell Fairly eligibility during renewal periods which occur twice a year: January-March and July-September.     The following are needed for each renewal:   - [facility] Identification (if you don't have one, then a bill with your name and address in [place])   - proof of income (award letter, W-2, or last three check stubs)   If you are unable to come in for renewal, let us know if we can mail, fax or e-mail paperwork to you.   HMAP Contact: [phone].     Lab info:  Your most recent CD4 T-cell counts and viral loads are below. Here are a few things to keep in mind when looking at your numbers:  Our goal is to get your virus to be undetectable and keep it undetectable. If the virus is undetectable you are much more likely to stay healthy.  We consider your viral load to be undetectable if it says <40 or if it says Not detected.  For most people, we're checking CD4 counts every other visit (once or twice a year, or sometimes even less).  It's normal for your CD4 count to be different from visit to visit.   You can help by taking your medications at about the same time, every single day. If you're having trouble with taking your medications, it's important to let us know.    Lab Results   Component Value Date    ACD4 855 09/15/2022    CD4 57 09/15/2022    HIVCP <20 (H) 09/15/2022    HIVRS Detected (A) 09/15/2022        Please note that your laboratory and other results may be visible to you in real time, possibly before they reach your provider. Please allow 48 hours for clinical interpretation of these results. Importantly, even if a result is flagged as abnormal, it may not be one that impacts  your health.    It was nice to have a visit with you today!  Follow-up information:  Bishop Limbo, Licensed Professional Counselor, [place], [place], [address] - Psychology Today   [phone] Online Therapy - Online Therapy [phone] - Online Therapy Counseling [phone] ([email or web address])       Provider today:  Varney Daily, FNP-BC      ID CLINIC address:   [facility] at [facility]  [address]  [place], [place] [address]    Contact information:    The ID clinic phone number is [phone]   The ID clinic fax number is [phone]  For urgent issues on nights and weekends: Call the ID Physician on-call through the [facility] Operator at [phone].    Please sign up for My [facility] Chart - This is a great way to review your labs and track your appointments    Please try to arrive 30 minutes BEFORE your scheduled appointment time!  This will give you time to fill out any front desk paperwork needed for your visit, and allow you to be seen as close to your scheduled appointment time as possible.

## 2023-05-04 LAB — SYPHILIS SCREEN: SYPHILIS RPR SCREEN: NONREACTIVE

## 2023-05-04 LAB — LYMPH MARKER LIMITED,FLOW
ABSOLUTE CD3 CNT: 1530 {cells}/uL (ref 915–3400)
ABSOLUTE CD4 CNT: 1116 {cells}/uL (ref 510–2320)
ABSOLUTE CD8 CNT: 414 {cells}/uL (ref 180–1520)
CD3% (T CELLS): 85 % (ref 61–86)
CD4% (T HELPER): 62 % — ABNORMAL HIGH (ref 34–58)
CD4:CD8 RATIO: 2.7 (ref 0.9–4.8)
CD8% T SUPPRESR: 23 % (ref 12–38)

## 2023-05-04 LAB — HIV RNA, QUANTITATIVE, PCR
HIV RNA QNT RSLT: DETECTED — AB
HIV RNA: <20 {copies}/mL — ABNORMAL HIGH (ref <0)

## 2023-05-05 NOTE — Unmapped (Signed)
Patient completed [employer] application. Benefits Counselor calculated income provided by patient. Patient is ineligible to receive RW B & C services except for Caps on Charges. Patients' income is over 300% [employer]. Expires: 09/19/2024    RW Eligibility Form informing patient about RW services and Caps on charges was sent to patient via MyChart Message          Mickle Asper,  Benefits & Eligibility Coordinator  Time of Intervention: 2 minutes

## 2023-05-16 ENCOUNTER — Institutional Professional Consult (permissible substitution): Admit: 2023-05-16 | Payer: PRIVATE HEALTH INSURANCE | Attending: Registered" | Primary: Registered"

## 2023-05-16 NOTE — Unmapped (Signed)
Nutrition Goals:  Start to prepare breakfast, snack and lunch meals to bring to workplace:  Breakfast:1 piece of whole-grain toast with 1-2 tbsp of peanut butter and either 1 greek yogurt or protein shake (premier protein, glucerna) OR cottage cheese with sliced strawberries  Snack: boiled eggs, apples, bananas, nuts with dried fruit  Lunch: salad with grilled chicken or whole-grain bread with [place], cheese, lettuce and tomato or burrito bowl with beans, whole-grain rice and lean protein  Reduce sugar-sweetened beverages (juice, soda) and replace with water or crystal-light flavored packets.  3. Practice myplate guidelines:  1/2 plate non-starchy vegetables  1/4 plate lean protein  1/4 plate starch

## 2023-05-16 NOTE — Unmapped (Signed)
[facility]  Adult Medical Nutrition Therapy - Monitoring Assessment  Date: 05/16/2023    Patient Name:             Karen Strickland    Date of Birth / Age / Birth Gender:            02/20/1966 / 57 y.o. / Female    Referring MD or Clinic:             Varney Daily I, FNP    Reason for Referral:   General, healthful diet order yes: weight management and weight reduction    Interpreter:            No      NUTRITION ASSESSMENT     New developments since last nutrition appointment on 04/01/2021:  57 y.o. year old female seeking nutritional counseling for weight management, weight reduction.    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    Pt has gained 17 lbs in the past 14 months which she attributes to excessive calorie intake, undesirable food choices due to living situation (pt is living between her 2  children) and work-related barriers. Pt notes that she is experiencing emotional eating episodes triggered by stress, depression due to not having a place of her own to prepare nutrient-dense meals.     Today, RD and pt discussed:  -protein sources, protein amounts  -dietary fiber sources  -reduction of sugar-sweetened beverages  -using my plate guidelines  -plant-based diet    DIET: For breakfast, pt will have a sausage sandwich or McDonalds meal. For lunch, pt will have a hot dog or chicken with chips and ranch sauce. For dinner, pt will have chicken, rice and gravy, corn and macaroni/cheese or nachos with ground beef. Beverages include: juice, soda, water.    PHYSICAL ACTIVITY: None reported.    FOOD INSECURITY: Pt screens positive. Pt does not receive SNAP benefits nor accesses a food pantry. RD will send pt food pantries in [place] area to access.      Physical Findings Data:               BP Today: n/a (telephone/video visit encounter)    Anthropometric Data:   Weight:  272 lbs clinic measure    BMI:   41.37 kg/m2      Weight Assessment:     Wt Readings from Last 5 Encounters:   05/03/23 (!) 123.4 kg (272 lb)   01/10/23 (!) 121.1 kg (267 lb)   09/15/22 (!) 121.1 kg (267 lb)   03/02/22 (!) 115.7 kg (255 lb)   03/02/22 (!) 116 kg (255 lb 12.8 oz)        Usual Daily Food Choices:    24-Hour Recall or Usual Intake:  Time Intake   Breakfast Sausage sandwich or McDonalds   Snack (AM)    Lunch Chicken breast with chips and ranch sauce   Or  Hot dog   Snack (PM)    Dinner [employer] with ground beef, cheese, sauce or   Baked chicken, rice and gravy, corn and macaroni cheese   Snack (HS)        Behavioral Factors:  Overeating: Lacks portion control with meals and snacks.   Emotional Eating: She noted eating to deal with feelings other than hunger including stress.  Grazing:  No issues reported.  Fast Eating:  No issues reported.   Nighttime Eating:  None reported.     Factors Affecting Food Intake:  Knowledge and Beliefs: She has limited success implementing nutrition recommendations.  Stress/Anxiety: Patient with medical history of depression., Patient noted stress related to living situation.   Sleep Patterns:  No issues noted at today's visit.   Food Safety and Access: She noted limited finances and resources.   Other: n/a    Food Intolerances/Dietary Restrictions:  No known food allergies or food intolerances.     Other GI Issues:  Denied issues    Hunger and Satiety:  Denied issues.       Allergies:  has No Known Allergies.    Food Insecurity:  I'm going to read you two statements that people have made about their food situation. For each statement, please tell me whether the statement was often true, sometimes true, or never true for your household in the last month.      1. ???We worried whether our food would run out before we got money to buy more.???   Sometimes True     2. ???The food that we bought just didn't last, and we didn't have money to get more.  Never True       Relevant Labs:  All relevant labs were reviewed and discussed.    Relevant Medications/Supplements:  Reviewed current medications. Reviewed nutritionally relevant medications and supplements.  Current Outpatient Medications   Medication Sig Dispense Refill    amlodipine (NORVASC) 5 MG tablet Take 1/2 tablet (2.5 mg total) by mouth daily. Please make appt for further refills. 15 tablet 0    elvitegravir-cobicistat-emtricitabine-tenofovir (GENVOYA) 150-150-200-10 mg Tab tablet Take 1 tablet by mouth daily. 90 tablet 3    oxybutynin (DITROPAN-XL) 10 MG 24 hr tablet Take 1 tablet (10 mg total) by mouth daily. 30 tablet 0    pantoprazole (PROTONIX) 40 MG tablet Take 1 tablet (40 mg total) by mouth daily. Needs clinic visit for additional refills 30 tablet 0    polyethylene glycol (MIRALAX) 17 gram packet Take 17 g by mouth daily. 30 packet 3    simethicone (GAS-X) 180 mg capsule Take 1 capsule (180 mg total) by mouth every six (6) hours as needed for flatulence. 90 capsule 0    valACYclovir (VALTREX) 1000 MG tablet Take 1 tablet (1,000 mg total) by mouth daily. 90 tablet 3    venlafaxine (EFFEXOR-XR) 150 MG 24 hr capsule Take 1 capsule (150 mg total) by mouth daily. 30 capsule 0     No current facility-administered medications for this visit.       Physical Activity:  Physical activity level is sedentary with little to no exercise.       Usual Daily Physical Activity Factors:   >/=1.0 to <1.4 (Sedentary)  Method for estimating physical activity factor: 2020-2025 DRI Guidelines      Estimated Adherence:   Client self-reported adherence score 8              Method for estimating adherence: Adult Meducation Readiness Ruler      NUTRITION DIAGNOSIS:   Excessive  energy intake   Inadequate  intake of grains  Inadequate  intake of fruits  Inadequate  intake of vegetables  Inadequate  intake of milk/milk products  Inadequate  intake of meat, poultry, fish, eggs, beans, nut products    Method for estimating nutritional adequacy: 2020-2025 DRI Guidelines     Clinical Diagnosis:   Overweight/obesity related to excessive calorie  intake, undesirable food choices as evidenced by most recent BMI of 41.37 kg/m2 and weight gain of 17 lbs in 14 months.         NUTRITION INTERVENTIONS:      Nutrition Counseling   Nutrition Education   Care Coordination:Collaboration with other provider(s): ID clinic provider.    Nutrition Goals:  Start to prepare breakfast, snack and lunch meals to bring to workplace:  Breakfast:1 piece of whole-grain toast with 1-2 tbsp of peanut butter and either 1 greek yogurt or protein shake (premier protein, glucerna) OR cottage cheese with sliced strawberries  Snack: boiled eggs, apples, bananas, nuts with dried fruit  Lunch: salad with grilled chicken or whole-grain bread with [place], cheese, lettuce and tomato or burrito bowl with beans, whole-grain rice and lean protein  Reduce sugar-sweetened beverages (juice, soda) and replace with water or crystal-light flavored packets.  3. Practice myplate guidelines:  1/2 plate non-starchy vegetables  1/4 plate lean protein  1/4 plate starch    Patient Education:  Individualized nutrition counseling and education provided on:   My Plate Guidelines  Lean Protein Sources  Sugar-Sweetened Beverages     Materials Provided:  Patient instructions: nutrition-related goals  Handouts supporting dietary recommendations (sent via email/Epic/mail): n/a  Additional Materials:   Written nutrition tips       Client agreed to follow interventions:  Yes         NUTRITION MONITORING AND EVALUATION:  Follow-up appointment: telephone/video  in 4 weeks (September 23rd at 12pm)      Goals for next appointment:   1.Start to prepare breakfast, snack and lunch meals to bring to workplace:  Breakfast:1 piece of whole-grain toast with 1-2 tbsp of peanut butter and either 1 greek yogurt or protein shake (premier protein, glucerna) OR cottage cheese with sliced strawberries  Snack: boiled eggs, apples, bananas, nuts with dried fruit  Lunch: salad with grilled chicken or whole-grain bread with [place], cheese, lettuce and tomato or burrito bowl with beans, whole-grain rice and lean protein  2.Reduce sugar-sweetened beverages (juice, soda) and replace with water or crystal-light flavored packets.  3. Practice myplate guidelines:  1/2 plate non-starchy vegetables  1/4 plate lean protein  1/4 plate starch                                         Number of visits with RD: 2 visits      Length of visit was:  29.31 minutes        The patient reports they are physically located in [place] and is currently: at home. I conducted a phone visit.  I spent 29.31 minutes on the phone call with the patient on the date of service .        Signed:  Unk Pinto  05/16/2023 12:41 PM

## 2023-05-17 DIAGNOSIS — I10 Essential (primary) hypertension: Principal | ICD-10-CM

## 2023-05-17 DIAGNOSIS — F32A Depression, unspecified depression type: Principal | ICD-10-CM

## 2023-05-17 MED ORDER — VENLAFAXINE ER 150 MG CAPSULE,EXTENDED RELEASE 24 HR
ORAL_CAPSULE | Freq: Every day | ORAL | 0 refills | 30 days | Status: CP
Start: 2023-05-17 — End: ?
  Filled 2023-05-19: qty 30, 30d supply, fill #0

## 2023-05-17 MED ORDER — AMLODIPINE 5 MG TABLET
ORAL | 0 refills | 30 days | Status: CP
Start: 2023-05-17 — End: ?
  Filled 2023-05-19: qty 15, 30d supply, fill #0

## 2023-05-17 MED ORDER — PANTOPRAZOLE 40 MG TABLET,DELAYED RELEASE
ORAL_TABLET | Freq: Every day | ORAL | 0 refills | 30 days | Status: CP
Start: 2023-05-17 — End: 2024-05-16
  Filled 2023-05-27: qty 30, 30d supply, fill #0

## 2023-05-17 NOTE — Unmapped (Signed)
[facility] Refill Coordination Note    Specialty Medication(s) to be Shipped:   Infectious Disease: Genvoya    Other medication(s) to be shipped:  amlodipine, pantoprazole, venlafaxine, valacyclovir     Karen Strickland, DOB: 03-03-1966  Phone: [phone] (home)       All above HIPAA information was verified with patient.     Was a Nurse, learning disability used for this call? No    Completed refill call assessment today to schedule patient's medication shipment from the [facility] [phone]).  All relevant notes have been reviewed.     Specialty medication(s) and dose(s) confirmed: Regimen is correct and unchanged.   Changes to medications: [name] reports no changes at this time.  Changes to insurance: No  New side effects reported not previously addressed with a pharmacist or physician: None reported  Questions for the pharmacist: No    Confirmed patient received a Conservation officer, historic buildings and a Surveyor, mining with first shipment. The patient will receive a drug information handout for each medication shipped and additional FDA Medication Guides as required.       DISEASE/MEDICATION-SPECIFIC INFORMATION        N/A    SPECIALTY MEDICATION ADHERENCE     Medication Adherence    Patient reported X missed doses in the last month: 0  Specialty Medication: GENVOYA 150-150-200-10 mg Tab tablet (elvitegravir-cobicistat-emtricitabine-tenofovir)  Patient is on additional specialty medications: No  Patient is on more than two specialty medications: No  Any gaps in refill history greater than 2 weeks in the last 3 months: no  Demonstrates understanding of importance of adherence: yes  Informant: patient  Confirmed plan for next specialty medication refill: delivery by pharmacy  Refills needed for supportive medications: not needed          Refill Coordination    Has the Patients' Contact Information Changed: No  Is the Shipping Address Different: No         Were doses missed due to medication being on hold? No    GENVOYA 150-150-200-10   mg: 4 days of medicine on hand       REFERRAL TO PHARMACIST     Referral to the pharmacist: Not needed      [facility]     Shipping address confirmed in Epic.       Delivery Scheduled: Yes, Expected medication delivery date: 05/20/2023.     Medication will be delivered via UPS to the prescription address in Epic WAM.    Karen Strickland   [facility] Specialty Technician

## 2023-05-17 NOTE — Unmapped (Signed)
Medication Requested: Amlodipine 5 mg tab & venlafaxine XR 150 mg cap    Last visit: 05/03/2023    Future Appointments   Date Time Provider Department Center   06/13/2023 12:00 PM Darlys Gales [facility]   08/09/2023  3:00 PM Varney Daily I, FNP [facility]     Per Provider Note:   Chronic conditions managed by PCP, Arby Barrette, PA-C:  Patient has not been able to have appointment with PCP and running out of medications. I have sent in 3 months supply of medications to bridge until her next appointment with PCP. She states she will call to make appointment.  Hypertension  - chronic, stable - 121/87 today, Continue Amlodipine   Depression  - chronic, stable -  On Effexor   Standing order protocol requirements met?: No    Sent to: Provider for signing    Days Supply Given: TBD  Number of Refills: TBD

## 2023-05-26 DIAGNOSIS — K219 Gastro-esophageal reflux disease without esophagitis: Principal | ICD-10-CM

## 2023-06-13 ENCOUNTER — Ambulatory Visit: Admit: 2023-06-13 | Payer: PRIVATE HEALTH INSURANCE | Attending: Registered" | Primary: Registered"

## 2023-06-14 DIAGNOSIS — F32A Depression, unspecified depression type: Principal | ICD-10-CM

## 2023-06-14 DIAGNOSIS — I10 Essential (primary) hypertension: Principal | ICD-10-CM

## 2023-06-14 MED ORDER — VENLAFAXINE ER 150 MG CAPSULE,EXTENDED RELEASE 24 HR
ORAL_CAPSULE | Freq: Every day | ORAL | 0 refills | 30 days
Start: 2023-06-14 — End: ?

## 2023-06-14 MED ORDER — PANTOPRAZOLE 40 MG TABLET,DELAYED RELEASE
ORAL_TABLET | Freq: Every day | ORAL | 0 refills | 30 days
Start: 2023-06-14 — End: 2024-06-13

## 2023-06-14 MED ORDER — AMLODIPINE 5 MG TABLET
ORAL_TABLET | Freq: Every day | ORAL | 0 refills | 30 days
Start: 2023-06-14 — End: ?

## 2023-06-14 NOTE — Unmapped (Signed)
[facility] Refill Coordination Note    Specialty Medication(s) to be Shipped:   Infectious Disease: Genvoya    Other medication(s) to be shipped:  amlodipine, valacyclovir, venlafaxine, and pantoprazole     Karen Strickland, DOB: 01/23/1966  Phone: [phone] (home)       All above HIPAA information was verified with patient.     Was a Nurse, learning disability used for this call? No    Completed refill call assessment today to schedule patient's medication shipment from the [facility] [phone]).  All relevant notes have been reviewed.     Specialty medication(s) and dose(s) confirmed: Regimen is correct and unchanged.   Changes to medications: [name] reports no changes at this time.  Changes to insurance: No  New side effects reported not previously addressed with a pharmacist or physician: None reported  Questions for the pharmacist: No    Confirmed patient received a Conservation officer, historic buildings and a Surveyor, mining with first shipment. The patient will receive a drug information handout for each medication shipped and additional FDA Medication Guides as required.       DISEASE/MEDICATION-SPECIFIC INFORMATION        N/A    SPECIALTY MEDICATION ADHERENCE     Medication Adherence    Patient reported X missed doses in the last month: 0  Specialty Medication: GENVOYA 150-150-200-10 mg  Patient is on additional specialty medications: No  Patient is on more than two specialty medications: No  Any gaps in refill history greater than 2 weeks in the last 3 months: no  Demonstrates understanding of importance of adherence: yes                Were doses missed due to medication being on hold? No    GENVOYA 150-150-200-10   mg: 7  days of medicine on hand       REFERRAL TO PHARMACIST     Referral to the pharmacist: Not needed      [facility]     Shipping address confirmed in Epic.       Delivery Scheduled: Yes, Expected medication delivery date: 06/21/2023.     Medication will be delivered via UPS to the prescription address in Epic WAM.    Ricci Barker   [facility] Specialty Technician

## 2023-06-15 MED ORDER — PANTOPRAZOLE 40 MG TABLET,DELAYED RELEASE
ORAL_TABLET | Freq: Every day | ORAL | 2 refills | 90 days | Status: CP
Start: 2023-06-15 — End: 2024-06-14
  Filled 2023-06-20: qty 30, 30d supply, fill #0

## 2023-06-16 MED ORDER — VENLAFAXINE ER 150 MG CAPSULE,EXTENDED RELEASE 24 HR
ORAL_CAPSULE | Freq: Every day | ORAL | 0 refills | 30 days | Status: CP
Start: 2023-06-16 — End: ?
  Filled 2023-06-20: qty 30, 30d supply, fill #0

## 2023-06-16 MED ORDER — AMLODIPINE 5 MG TABLET
ORAL | 0 refills | 30 days | Status: CP
Start: 2023-06-16 — End: ?
  Filled 2023-06-20: qty 15, 30d supply, fill #0

## 2023-06-20 MED FILL — VALACYCLOVIR 1 GRAM TABLET: ORAL | 30 days supply | Qty: 30 | Fill #1

## 2023-06-20 MED FILL — GENVOYA 150 MG-150 MG-200 MG-10 MG TABLET: ORAL | 30 days supply | Qty: 30 | Fill #1

## 2023-06-20 NOTE — Unmapped (Signed)
Patient needs to reschedule mised appt with Donnald Garre

## 2023-07-20 DIAGNOSIS — F32A Depression, unspecified depression type: Principal | ICD-10-CM

## 2023-07-20 DIAGNOSIS — I10 Essential (primary) hypertension: Principal | ICD-10-CM

## 2023-07-20 MED ORDER — AMLODIPINE 5 MG TABLET
ORAL_TABLET | Freq: Every day | ORAL | 0 refills | 30.00000 days
Start: 2023-07-20 — End: ?

## 2023-07-20 MED ORDER — VENLAFAXINE ER 150 MG CAPSULE,EXTENDED RELEASE 24 HR
ORAL_CAPSULE | Freq: Every day | ORAL | 0 refills | 30 days
Start: 2023-07-20 — End: ?

## 2023-07-20 NOTE — Unmapped (Signed)
[facility] Delivery Pharm[facility] Refill Coordination Note    Specialty Medication(s) to be Shipped:   Infectious Disease: Genvoya    Other medication(s) to be shipped:  valacyclovir , pantoprazole , amlodipine and venlafaxine     Karen Strickland, DOB: [birth year]  Phone: [phone] (home)       All above HIPAA information was verified with patient.     Was a Nurse, learning disability used for this call? No    Completed refill call assessment today to schedule patient's medication shipment from the [facility]  [phone]).  All relevant notes have been reviewed.     Specialty medication(s) and dose(s) confirmed: Regimen is correct and unchanged.   Changes to medications: [name] reports no changes at this time.  Changes to insurance: No  New side effects reported not previously addressed with a pharmacist or physician: None reported  Questions for the pharmacist: No    Confirmed patient received a Conservation officer, historic buildings and a Surveyor, mining with first shipment. The patient will receive a drug information handout for each medication shipped and additional FDA Medication Guides as required.       DISEASE/MEDICATION-SPECIFIC INFORMATION        N/A    SPECIALTY MEDICATION ADHERENCE     Medication Adherence    Patient reported X missed doses in the last month: 0  Specialty Medication: GENVOYA 150-150-200-10 mg Tab tablet (elvitegravir-cobicistat-emtricitabine-tenofovir)  Patient is on additional specialty medications: No              Were doses missed due to medication being on hold? No    Genvoya 150-150-200-10  mg : 5 days of medicine on hand       REFERRAL TO PHARMACIST     Referral to the pharmacist: Not needed      [facility]     Shipping address confirmed in Epic.       Delivery Scheduled: Yes, Expected medication delivery date: 07/22/23.     Medication will be delivered via UPS to the prescription address in Epic WAM.    [name]   [facility] Specialty Technician

## 2023-07-21 DIAGNOSIS — I10 Essential (primary) hypertension: Principal | ICD-10-CM

## 2023-07-21 DIAGNOSIS — F32A Depression, unspecified depression type: Principal | ICD-10-CM

## 2023-07-21 MED FILL — PANTOPRAZOLE 40 MG TABLET,DELAYED RELEASE: ORAL | 30 days supply | Qty: 30 | Fill #1

## 2023-07-21 MED FILL — GENVOYA 150 MG-150 MG-200 MG-10 MG TABLET: ORAL | 30 days supply | Qty: 30 | Fill #2

## 2023-07-21 MED FILL — VALACYCLOVIR 1 GRAM TABLET: ORAL | 30 days supply | Qty: 30 | Fill #2

## 2023-07-22 MED ORDER — VENLAFAXINE ER 150 MG CAPSULE,EXTENDED RELEASE 24 HR
ORAL_CAPSULE | Freq: Every day | ORAL | 0 refills | 30 days | Status: CP
Start: 2023-07-22 — End: ?
  Filled 2023-07-22: qty 30, 30d supply, fill #0

## 2023-07-22 MED ORDER — AMLODIPINE 5 MG TABLET
ORAL_TABLET | Freq: Every day | ORAL | 0 refills | 30 days | Status: CP
Start: 2023-07-22 — End: ?
  Filled 2023-07-22: qty 15, 30d supply, fill #0

## 2023-08-11 DIAGNOSIS — I10 Essential (primary) hypertension: Principal | ICD-10-CM

## 2023-08-11 DIAGNOSIS — F32A Depression, unspecified depression type: Principal | ICD-10-CM

## 2023-08-11 MED ORDER — VENLAFAXINE ER 150 MG CAPSULE,EXTENDED RELEASE 24 HR
ORAL_CAPSULE | Freq: Every day | ORAL | 0 refills | 30 days | Status: CP
Start: 2023-08-11 — End: ?
  Filled 2023-08-16: qty 30, 30d supply, fill #0

## 2023-08-11 MED ORDER — AMLODIPINE 5 MG TABLET
ORAL_TABLET | Freq: Every day | ORAL | 0 refills | 30 days | Status: CP
Start: 2023-08-11 — End: ?
  Filled 2023-08-16: qty 15, 30d supply, fill #0

## 2023-08-11 NOTE — Unmapped (Signed)
[facility] Special[facility] and Home Delivery Pharmacy Refill Coordination Note    Specialty Medication(s) to be Shipped:   Infectious Disease: Genvoya    Other medication(s) to be shipped:  amlodipine, venlafaxine, pantoprazole, valacyclovir     Karen Strickland, DOB: 31-Dec-1965  Phone: [phone] (home)       All above HIPAA information was verified with patient.     Was a Nurse, learning disability used for this call? No    Completed refill call assessment today to schedule patient's medication shipment from the [facility]  [phone]).  All relevant notes have been reviewed.     Specialty medication(s) and dose(s) confirmed: Regimen is correct and unchanged.   Changes to medications: Sanaiah reports no changes at this time.  Changes to insurance: No  New side effects reported not previously addressed with a pharmacist or physician: None reported  Questions for the pharmacist: No    Confirmed patient received a Conservation officer, historic buildings and a Surveyor, mining with first shipment. The patient will receive a drug information handout for each medication shipped and additional FDA Medication Guides as required.       DISEASE/MEDICATION-SPECIFIC INFORMATION        N/A    SPECIALTY MEDICATION ADHERENCE     Medication Adherence    Patient reported X missed doses in the last month: 0  Specialty Medication: GENVOYA 150-150-200-10 mg Tab tablet (elvitegravir-cobicistat-emtricitabine-tenofovir)  Patient is on additional specialty medications: No  Patient is on more than two specialty medications: No  Any gaps in refill history greater than 2 weeks in the last 3 months: no  Demonstrates understanding of importance of adherence: yes  Informant: patient  Confirmed plan for next specialty medication refill: delivery by pharmacy  Refills needed for supportive medications: not needed          Refill Coordination    Has the Patients' Contact Information Changed: No  Is the Shipping Address Different: No         Were doses missed due to medication being on hold? No    GENVOYA 150-150-200-10   mg: 7 days of medicine on hand       REFERRAL TO PHARMACIST     Referral to the pharmacist: Not needed      [facility]     Shipping address confirmed in Epic.       Delivery Scheduled: Yes, Expected medication delivery date: 08/17/23.     Medication will be delivered via UPS to the prescription address in Epic WAM.    [name]   [facility] Delivery Pharm[facility]  Specialty Technician

## 2023-08-16 MED FILL — VALACYCLOVIR 1 GRAM TABLET: ORAL | 30 days supply | Qty: 30 | Fill #3

## 2023-08-16 MED FILL — PANTOPRAZOLE 40 MG TABLET,DELAYED RELEASE: ORAL | 30 days supply | Qty: 30 | Fill #2

## 2023-08-16 MED FILL — GENVOYA 150 MG-150 MG-200 MG-10 MG TABLET: ORAL | 30 days supply | Qty: 30 | Fill #3

## 2023-08-22 NOTE — Unmapped (Unsigned)
[facility]  [address]  [place], [place]  [address]  P [phone]  F [phone]     Primary care provider: Julieanne Cotton, PA      Assessment/Plan:      HIV (dx'd 2004)  - chronic, stable  Health Department came to her home and told she needed testing.  Participated in study that started her on Edurant(RPV) and Truvada(FTC/TDF)-equivalent to Complera  Started on Odefsy which is covered on current insurance plan in 01/2021. (Patient's insurance did not cover for Complera. Odefsy is equivalent, but has TAF rather than TDF. Patient amenable to starting this.)    Overall doing well. Current regimen: Genvoya (EVG/c/FTC/TAF)   Misses doses of ARVs never     Med access through insurance  CD4 count  see below  Discussed ARV adherence and taking ARVs with food    Lab Results   Component Value Date    ACD4 1,116 05/03/2023    CD4 62 (H) 05/03/2023    HIVRS Detected (A) 05/03/2023    HIVCP <20 (H) 05/03/2023     CD4, HIV RNA, and safety labs (full return panel)  Continue current therapy. Started on Genvoya on 11/19/21  Discussed importance of ARV adherence  Previously discussed Cabenuva with patient and she is willing to come to clinic for injections but patient recently has canceled multiple appointments with me and has exacerbated depression and anxiety that could be a barrier to consistent attendance of appointments.   Plan is to have patient establish with psychiatry and therapy and once this is more stable, will revisit Cabenuva start. Patient agrees with plan.  Once she is a good candidate for Cabenuva, will need monitoring with PHQ9.      Mental Health - chronic, exacerbated  PHQ-9 19, P4 - minimal  Exacerbated depression and anxiety.  See HPI  SW sent resources to patient for therapy and psychiatry  Urgent referral sent to [facility] (non-ID)  Assisted patient in finding potential therapist, she will call her asap.  Informed patient of 82 resources if she is in crisis - she agrees to call or text them if needed.  Denies any SI.      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  Patient also has oral lesions, denies any genital lesions.  On suppression. RX sent for Valtrex 1 gram daily. - refills provided      History of CoVid infection (02/2019) - acute, self-limited  Symptoms included: Fever, body aches. Denies loss of taste or smell, coughing, GI complaints  Symptoms resolved.      Chronic conditions managed by PCP, Arby Barrette, PA-C:  Patient has not been able to have appointment with PCP and running out of medications. I have sent in 3 months supply of medications to bridge until her next appointment with PCP. She states she will call to make appointment.  Hypertension  - chronic, stable - 121/87 today, Continue Amlodipine   Depression  - chronic, stable -  On Effexor   BMI 40.61 - chronic - Working on diet and exercise. Would like a tummy tuck in the future once she loses weight. Will need to keep in mind, weight neutral ART regimen if needing to change ART      Sexual health & secondary prevention  - chronic, stable  Not in relationship.  LSE end of July 2022 - no sex since last visit  Parts of body used during sex  include: mouth and vagina. Does not have anal sex. Gives and receives oral sex. Receptive partner for vaginal sex.   In past 6 months has had no sex and has not had add'l STI screening.  She  uses condoms  She does routinely discuss HIV status with partner(s).  Have not discussed interest in having children.    Lab Results   Component Value Date    RPR Nonreactive 05/03/2023    RPR Nonreactive 03/02/2022     GC/CT NAATs - patient declined screening  RPR - for screening obtained today      Health maintenance  - chronic, stable  PCP: Julieanne Cotton, PA-C   Patient needs appointment. I stressed importance of following up with PCP.    Oral health  She does  have a dentist. Last dental exam 09/2020.    Eye health  She does  use corrective lenses. Last eye exam 08/2020. Early glaucoma    Metabolic conditions  Wt Readings from Last 5 Encounters:   05/03/23 (!) 123.4 kg (272 lb)   01/10/23 (!) 121.1 kg (267 lb)   09/15/22 (!) 121.1 kg (267 lb)   03/02/22 (!) 115.7 kg (255 lb)   03/02/22 (!) 116 kg (255 lb 12.8 oz)     Lab Results   Component Value Date    CREATININE 1.05 (H) 05/03/2023    GLUCOSEU Negative 09/15/2022    GLU 115 05/03/2023    A1C 5.9 (H) 05/03/2023    ALT 24 05/03/2023    ALT 19 09/15/2022    ALT 27 03/02/2022     # Kidney health - creatinine today  # Bone health - FRAX score major osteoporotic 2.1%/Hip fracture 0.1%% on this date: 02/13/21  # Diabetes assessment - random glucose today  # NAFLD assessment - monitor over time    Communicable diseases  Lab Results   Component Value Date    HEPAIGG Reactive (A) 04/24/2021    HEPBSAB Reactive (A) 04/24/2021     # TB screening - no longer needed; negative IGRA, low risk 01/04/2019  # Hepatitis screening -  as noted:  (07/21/20) HepB sAg NR, HepC Ab NR, (04/24/21) HepB sAb REACTIVE, HepB cAb NR, HepA REACTIVE Hep A/B IMMUNE  # MMR screening -  01/04/19 Measles immune    Cancer screening  No results found for: PSASCRN, PSA, PAP, FINALDX  # Cervical -  Managed by GYN  # Breast -  Managed by GYN    # Anorectal -  Needs anal pap , declines today  # Colorectal -  Defer to PCP  # Liver - no screening indicated  # Lung - screening not indicated    Cardiovascular disease  Lab Results   Component Value Date    CHOL 146 05/03/2023    HDL 57 05/03/2023    LDL 57 05/03/2023    NONHDL 89 05/03/2023    TRIG 098 (H) 05/03/2023     # The 10-year ASCVD risk score (Arnett DK, et al., 2019) is: 2.6%  - is not taking aspirin   - is not taking statin  - BP control good  - never smoker  # AAA screening - no indication for screening    Immunization History   Administered Date(s) Administered    COVID-19 VAC,BIVALENT,MODERNA(BLUE CAP) 03/02/2022    COVID-19 VACC,MRNA(BOOSTER)OR(6-41YR)MODERNA 02/11/2021 COVID-19 VACCINE,MRNA(MODERNA)(PF) 02/13/2020, 03/12/2020    Covid-19 Vac, (98yr+) (Spikevax) Monovalent Moderna 09/15/2022    DTaP, Unspecified Formulation 03/09/1966, 04/08/1966, 05/10/1966, 05/17/1968    Hepatitis  B vaccine, pediatric/adolescent dosage, 05/17/2003, 06/18/2003, 11/22/2003    INFLUENZA TIV (TRI) 84MO+ W/ PRESERV (IM) 09/27/2011, 06/07/2012, 07/20/2012, 06/08/2013, 06/03/2015, 08/06/2019    Influenza Vaccine Quad (IIV4 W/PRESERV) 84MO+ 09/27/2011, 06/07/2012, 06/08/2013, 06/03/2015, 08/06/2019    Influenza Vaccine Quad(IM)6 MO-Adult(PF) 10/30/2021, 09/15/2022    Influenza Virus Vaccine, unspecified formulation 09/27/2011, 06/07/2012, 07/20/2012, 06/08/2013, 06/03/2015    Measles 04/12/1969    Pneumococcal Conjugate 20-valent 10/30/2021     Immunizations today - no immunizations needed today but will need updated CoVid and flu vaccine at next visit.  Needs Shingles, Menveo      I personally spent 60 minutes face-to-face and non-face-to-face in the care of this patient, which includes all pre, intra, and post visit time on the date of service.  All documented time was specific to the E/M visit and does not include any procedures that may have been performed.        Disposition  Next appointment: 3 months      To do @ next RTC  Anal pap - next visit  Flu and CoVid  Established with Psych and Therapy?      Varney Daily, FNP-BC  [facility]  [address], [place], [place] [address]    Phone: [phone]   Fax: [phone]           Subjective      Chief Complaint   Routine HIV follow up    HPI  In addition to details in A&P above:  Denies any fever, chills, nausea, vomiting, rash, urinary complaints, diarrhea, constipation.  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  Has been under a lot stress  Has been with boyfreind who is chronically ill and has cancer - Stage 4 lung and renal cancer, COPD, DM  No sex x 1year.  Has Daughter and 2 grandsons living with her, living in 1 bedroom apartment very stressful  Would like to be referred for therapy services.    Interim History 03/02/22:  Working with dietician (local), stress eating., emotional eater.  May be interested in Weight management clinic if she can have virtual visits  Just had a breakup this week or last week. Dealing with it well though it has made her lose her faith in men.  Daughter and 2 grandchildren continues to be a source of stress.  Has been working as a Midwife for [employer]  Has been trying to get away from her kids but they keep following her.  Waiting on getting a therapist till she moves back to [place]. She does not want to see anyone before that.  Decreased sex drive so not interested in sex but wants companionship, hard to find someone who shares this sentiment.  Has early glaucoma, plans to see eye doctor.    Interim History 09/15/22:  Has urine odor, frequent urination x 6 days, lower back pain x 1 day  No N/V, fever, chills, no dysuria  In [place] now, since September 12 living with oldest daughter  In better mental status. Had lived with her youngest daughter in [place], couldn't get along. She is now more at peace and is in a less stressful environment.  Didn't like living in [place].  Wants to lose weight and get tummy tuck down the road.     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 Would like to stay there at least 5-6 years in order for her to retire.  Depression and anxiety are exacerbated for the following reasons:  Work is stressful, dealing with colleagues and her boss, politics in the workplace  Lives with her daughter and grandkids. She is seeing their behavior and feels that she failed with them in some way  Feels her age and wants to find a partner (hasn't been dating)  Has unresolved trauma from childhood and grief.  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 All others negative.      Medications and Allergies  She has a current medication list which includes the following prescription(s): amlodipine, genvoya, oxybutynin, pantoprazole, polyethylene glycol, simethicone, valacyclovir, and venlafaxine.    Allergies: Patient has no known allergies.      Family History  Her family history includes Asthma in her maternal aunt and maternal grandmother; COPD in her mother; Cancer in her maternal aunt, maternal grandmother, and son; Depression in her mother; Diabetes in her brother and father; Heart disease in her maternal aunt and mother; Hypertension in her brother, father, and mother; Kidney disease in her paternal aunt and paternal uncle.              Objective:      There were no vitals taken for this visit.  Wt Readings from Last 3 Encounters:   05/03/23 (!) 123.4 kg (272 lb)   01/10/23 (!) 121.1 kg (267 lb)   09/15/22 (!) 121.1 kg (267 lb)       Const looks well and attentive, alert, appropriate   Eyes sclerae anicteric, noninjected OU   ENT no thrush, leukoplakia or oral lesions   Lymph no cervical or supraclavicular LAD   CV RRR. No murmurs. No rub or gallop. S1/S2.   Lungs CTAB ant/post, normal work of breathing   GI Soft, no organomegaly. NTND. NABS.   GU deferred No CVA tenderness   Rectal deferred   Skin no petechiae, ecchymoses or obvious rashes on clothed exam   MSK no joint tenderness and normal ROM throughout   Neuro grossly intact   Psych Appropriate affect. Eye contact good. Linear thoughts. Fluent speech.     Laboratory Data  Reviewed in Epic today, using Synopsis and Chart Review filters.    Lab Results   Component Value Date    CREATININE 1.05 (H) 05/03/2023    CHOL 146 05/03/2023    HDL 57 05/03/2023    LDL 57 05/03/2023    NONHDL 89 05/03/2023    TRIG 469 (H) 05/03/2023    A1C 5.9 (H) 05/03/2023

## 2023-08-30 ENCOUNTER — Ambulatory Visit: Admit: 2023-08-30 | Discharge: 2023-08-31 | Payer: PRIVATE HEALTH INSURANCE | Attending: Family | Primary: Family

## 2023-08-30 DIAGNOSIS — Z79899 Other long term (current) drug therapy: Principal | ICD-10-CM

## 2023-08-30 DIAGNOSIS — Z113 Encounter for screening for infections with a predominantly sexual mode of transmission: Principal | ICD-10-CM

## 2023-08-30 DIAGNOSIS — B20 Human immunodeficiency virus [HIV] disease: Principal | ICD-10-CM

## 2023-08-30 DIAGNOSIS — Z23 Encounter for immunization: Principal | ICD-10-CM

## 2023-08-30 DIAGNOSIS — Z5181 Encounter for therapeutic drug level monitoring: Principal | ICD-10-CM

## 2023-08-30 DIAGNOSIS — Z9189 Other specified personal risk factors, not elsewhere classified: Principal | ICD-10-CM

## 2023-08-30 LAB — CBC W/ AUTO DIFF
BASOPHILS ABSOLUTE COUNT: 0 10*9/L (ref 0.0–0.1)
BASOPHILS RELATIVE PERCENT: 0.5 %
EOSINOPHILS ABSOLUTE COUNT: 0.1 10*9/L (ref 0.0–0.5)
EOSINOPHILS RELATIVE PERCENT: 0.7 %
HEMATOCRIT: 38.7 % (ref 34.0–44.0)
HEMOGLOBIN: 12.7 g/dL (ref 11.3–14.9)
LYMPHOCYTES ABSOLUTE COUNT: 2 10*9/L (ref 1.1–3.6)
LYMPHOCYTES RELATIVE PERCENT: 22.6 %
MEAN CORPUSCULAR HEMOGLOBIN CONC: 32.7 g/dL (ref 32.0–36.0)
MEAN CORPUSCULAR HEMOGLOBIN: 29.1 pg (ref 25.9–32.4)
MEAN CORPUSCULAR VOLUME: 88.9 fL (ref 77.6–95.7)
MEAN PLATELET VOLUME: 7.8 fL (ref 6.8–10.7)
MONOCYTES ABSOLUTE COUNT: 0.5 10*9/L (ref 0.3–0.8)
MONOCYTES RELATIVE PERCENT: 5.7 %
NEUTROPHILS ABSOLUTE COUNT: 6.3 10*9/L (ref 1.8–7.8)
NEUTROPHILS RELATIVE PERCENT: 70.5 %
PLATELET COUNT: 436 10*9/L (ref 150–450)
RED BLOOD CELL COUNT: 4.35 10*12/L (ref 3.95–5.13)
RED CELL DISTRIBUTION WIDTH: 15.1 % (ref 12.2–15.2)
WBC ADJUSTED: 8.9 10*9/L (ref 3.6–11.2)

## 2023-08-30 LAB — BASIC METABOLIC PANEL
ANION GAP: 12 mmol/L (ref 5–14)
BLOOD UREA NITROGEN: 20 mg/dL (ref 9–23)
BUN / CREAT RATIO: 18
CALCIUM: 10.1 mg/dL (ref 8.7–10.4)
CHLORIDE: 100 mmol/L (ref 98–107)
CO2: 28.4 mmol/L (ref 20.0–31.0)
CREATININE: 1.11 mg/dL — ABNORMAL HIGH (ref 0.55–1.02)
EGFR CKD-EPI (2021) FEMALE: 57 mL/min/{1.73_m2} — ABNORMAL LOW (ref >=60)
GLUCOSE RANDOM: 107 mg/dL (ref 70–179)
POTASSIUM: 3.1 mmol/L — ABNORMAL LOW (ref 3.4–4.8)
SODIUM: 140 mmol/L (ref 135–145)

## 2023-08-30 LAB — BILIRUBIN, TOTAL: BILIRUBIN TOTAL: 0.2 mg/dL — ABNORMAL LOW (ref 0.3–1.2)

## 2023-08-30 LAB — ALT: ALT (SGPT): 32 U/L (ref 10–49)

## 2023-08-30 LAB — AST: AST (SGOT): 23 U/L (ref <=34)

## 2023-08-30 MED ORDER — GENVOYA 150 MG-150 MG-200 MG-10 MG TABLET
ORAL_TABLET | Freq: Every day | ORAL | 3 refills | 90.00 days | Status: CP
Start: 2023-08-30 — End: ?
  Filled 2023-09-23: qty 30, 30d supply, fill #0

## 2023-08-30 NOTE — Unmapped (Signed)
INFECTIOUS DISEASES CLINIC  [address]  [place], [place]  [address]  P [phone]  F [phone]     Primary care provider: Julieanne Cotton, PA    Assessment/Plan:      Patient was present in waiting room at time of appointment but was not checked in all the way and was not discovered until patient was 30 minutes late for her appointment.    HIV (dx'd 2004)  - chronic, stable  Health Department came to her home and told she needed testing.  Participated in study that started her on Edurant(RPV) and Truvada(FTC/TDF)-equivalent to Complera  Started on Odefsy which is covered on current insurance plan in 01/2021. (Patient's insurance did not cover for Complera. Odefsy is equivalent, but has TAF rather than TDF. Patient amenable to starting this.)    Overall doing well. Current regimen: Genvoya (EVG/c/FTC/TAF)   Misses doses of ARVs never     Med access through insurance  CD4 count  see below  Discussed ARV adherence and taking ARVs with food    Lab Results   Component Value Date    ACD4 1,116 05/03/2023    CD4 62 (H) 05/03/2023    HIVRS Detected (A) 08/30/2023    HIVCP <20 (H) 08/30/2023     HIV RNA and safety labs (brief return panel)  Continue current therapy. Started on Genvoya on 11/19/21  Discussed importance of ARV adherence  Previously discussed Cabenuva with patient and she is willing to come to clinic for injections but patient recently has canceled multiple appointments with me and has exacerbated depression and anxiety that could be a barrier to consistent attendance of appointments.   Plan is to have patient establish with psychiatry and therapy and once this is more stable, referral had been placed but was not able to establish as her insurance appeared to be out of network. Discussed this with patient who reports she now has [employer] through employer but never canceled [employer] which is showing up as primary insurance. Patient will cancel this as soon as possible, phone number given to healthcare.gov - [phone]   Once she is a good candidate for Cabenuva, will need monitoring with PHQ9.      Mental Health - chronic, exacerbated  PHQ-9 19, P4 - minimal  Mood better.   Continues to have stress from family and work but patient is managing better.  Has new relationship x 2 months  SW sent resources to patient for therapy and psychiatry  Urgent referral sent to [facility] (non-ID) - not processed because of insurance being OON.  Patient would like to still establish with therapist and psychiatrist to address her past trauma. Plans to get her insurance in order and will put in referral to [facility] again once that is done.      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  Patient also has oral lesions, denies any genital lesions.  On suppression. RX sent for Valtrex 1 gram daily. - refills provided today      History of CoVid infection (02/2019) - acute, self-limited  Symptoms included: Fever, body aches. Denies loss of taste or smell, coughing, GI complaints  Symptoms resolved.      Chronic conditions managed by PCP, Arby Barrette, PA-C:  Patient has not been able to have appointment with PCP and running out of medications. I have sent in 3 months supply of medications to bridge until her next appointment  with PCP. She states she will call to make appointment.  Hypertension  - chronic, stable - 121/87 today, Continue Amlodipine   Depression  - chronic, stable -  On Effexor   Obesity - BMI 40.61 - chronic - Working on diet and exercise. Would like a tummy tuck in the future once she loses weight. Will need to keep in mind, weight neutral ART regimen if needing to change ART      Sexual health & secondary prevention  - chronic, stable  Not in relationship.  LSE since last visit, new partner  Parts of body used during sex include: mouth and vagina. Does not have anal sex. Gives and receives oral sex. Receptive partner for vaginal sex. In past 6 months has had no sex and has not had add'l STI screening.  She  uses condoms  She does routinely discuss HIV status with partner(s).  Have not discussed interest in having children.    Lab Results   Component Value Date    RPR Nonreactive 08/30/2023    RPR Nonreactive 05/03/2023    CTNAA Negative 08/30/2023    GCNAA Negative 08/30/2023    SPECSOURCE Patient-collected Vaginal Swab 08/30/2023     GC/CT NAATs - obtained today from all exposed anatomical site(s)  RPR - for screening obtained today      Health maintenance  - chronic, stable  PCP: Julieanne Cotton, PA-C   Patient needs appointment. I stressed importance of following up with PCP.    Oral health  She does  have a dentist. Last dental exam 09/2020.    Eye health  She does  use corrective lenses. Last eye exam 08/2020. Early glaucoma    Metabolic conditions  Wt Readings from Last 5 Encounters:   08/30/23 (!) 122.1 kg (269 lb 3.2 oz)   05/03/23 (!) 123.4 kg (272 lb)   01/10/23 (!) 121.1 kg (267 lb)   09/15/22 (!) 121.1 kg (267 lb)   03/02/22 (!) 115.7 kg (255 lb)     Lab Results   Component Value Date    CREATININE 1.11 (H) 08/30/2023    GLUCOSEU Negative 09/15/2022    GLU 107 08/30/2023    A1C 5.9 (H) 05/03/2023    ALT 32 08/30/2023    ALT 24 05/03/2023    ALT 19 09/15/2022     # Kidney health - creatinine today - UA at next visit  # Bone health - DEXA needs DEXA  # Diabetes assessment - random glucose today  # NAFLD assessment - monitor over time    Communicable diseases  Lab Results   Component Value Date    HEPAIGG Reactive (A) 04/24/2021    HEPBSAB Reactive (A) 04/24/2021     # TB screening - no longer needed; negative IGRA, low risk 01/04/2019  # Hepatitis screening -  as noted:  (07/21/20) HepB sAg NR, HepC Ab NR, (04/24/21) HepB sAb REACTIVE, HepB cAb NR, HepA REACTIVE Hep A/B IMMUNE  # MMR screening -  01/04/19 Measles immune    Cancer screening  No results found for: PSASCRN, PSA, PAP, FINALDX  # Cervical -  Managed by GYN  # Breast -  Managed by GYN    # Anorectal -  Needs anal pap , declines today  # Colorectal -  Defer to PCP  # Liver - no screening indicated  # Lung - screening not indicated    Cardiovascular disease  Lab Results   Component Value Date    CHOL 146 05/03/2023  HDL 57 05/03/2023    LDL 57 05/03/2023    NONHDL 89 05/03/2023    TRIG 086 (H) 05/03/2023     # The 10-year ASCVD risk score (Arnett DK, et al., 2019) is: 3%  - is not taking aspirin   - is not taking statin  - BP control good  - never smoker  # AAA screening - no indication for screening    Immunization History   Administered Date(s) Administered    COVID-19 VAC,BIVALENT,MODERNA(BLUE CAP) 03/02/2022    COVID-19 VACC,MRNA(BOOSTER)OR(6-54YR)MODERNA 02/11/2021    COVID-19 VACCINE,MRNA(MODERNA)(PF) 02/13/2020, 03/12/2020    Covid-19 Vac, (97yr+) (Spikevax) Monovalent Moderna 09/15/2022    DTaP, Unspecified Formulation 03/09/1966, 04/08/1966, 05/10/1966, 05/17/1968    Hepatitis B vaccine, pediatric/adolescent dosage, 05/17/2003, 06/18/2003, 11/22/2003    INFLUENZA TIV (TRI) 50MO+ W/ PRESERV (IM) 09/27/2011, 06/07/2012, 07/20/2012, 06/08/2013, 06/03/2015, 08/06/2019    INFLUENZA VACCINE IIV3(IM)(PF)6 MOS UP 08/30/2023    Influenza Vaccine Quad (IIV4 W/PRESERV) 50MO+ 09/27/2011, 06/07/2012, 06/08/2013, 06/03/2015, 08/06/2019    Influenza Vaccine Quad(IM)6 MO-Adult(PF) 06/03/2015, 08/06/2019, 06/25/2020, 10/30/2021, 09/15/2022    Influenza Virus Vaccine, unspecified formulation 09/27/2011, 06/07/2012, 07/20/2012, 06/08/2013, 06/03/2015    Measles 04/12/1969    PNEUMOCOCCAL POLYSACCHARIDE 23-VALENT 07/20/2005, 01/08/2011    PPD Test 11/17/2004, 01/20/2006, 01/08/2011, 02/04/2012    Pneumococcal Conjugate 13-Valent 10/27/2009, 02/04/2012    Pneumococcal Conjugate 20-valent 10/30/2021    Pneumococcal vaccine, Unspecified Formulation 04/02/2003    Polio Virus Vaccine, Unspecified Formulation 03/09/1966, 05/10/1966, 05/17/1968    Rubella 12/26/1980 TD(TDVAX),ADSORBED,2LF(IM)(PF) 04/02/2003    TD, ADSORBED, 5LF TETANUS TOXOID, PF(TENIVAC/DECAVAC) 09/20/2002    TdaP 10/27/2008, 01/08/2011    Tetanus 09/08/1982    Zoster Vaccine, Unspecified Formulation 08/06/2019, 10/08/2019     Immunizations today - influenza today but will get coVid vaccine at local [facility]   Needs Shingles, Menveo      I personally spent 30 minutes face-to-face and non-face-to-face in the care of this patient, which includes all pre, intra, and post visit time on the date of service.  All documented time was specific to the E/M visit and does not include any procedures that may have been performed.        Disposition  Next appointment: 3 months      To do @ next RTC  Anal pap - next visit  Flu and CoVid  Established with Psych and Therapy?  UA  Hep C Ab      Varney Daily, FNP-BC  [facility] Infect[facility] at [facility]  [address] [place], [place] [address]    Phone: [phone]   Fax: [phone]           Subjective      Chief Complaint   Routine HIV follow up    HPI  In addition to details in A&P above:  Denies any fever, chills, nausea, vomiting, rash, urinary complaints, diarrhea, constipation.  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  Has been under a lot stress  Has been with boyfreind who is chronically ill and has cancer - Stage 4 lung and renal cancer, COPD, DM  No sex x 1year.  Has Daughter and 2 grandsons living with her, living in 1 bedroom apartment very stressful  Would like to be referred for therapy services.    Interim History 03/02/22:  Working with dietician (local), stress eating., emotional  eater.  May be interested in Weight management clinic if she can have virtual visits  Just had a breakup this week or last week. Dealing with it well though it has made her lose her faith in men.  Daughter and 2 grandchildren continues to be a source of stress.  Has been working as a Midwife for [employer]  Has been trying to get away from her kids but they keep following her.  Waiting on getting a therapist till she moves back to [place]. She does not want to see anyone before that.  Decreased sex drive so not interested in sex but wants companionship, hard to find someone who shares this sentiment.  Has early glaucoma, plans to see eye doctor.    Interim History 09/15/22:  Has urine odor, frequent urination x 6 days, lower back pain x 1 day  No N/V, fever, chills, no dysuria  In [place] now, since September 12 living with oldest daughter  In better mental status. Had lived with her youngest daughter in [place], couldn't get along. She is now more at peace and is in a less stressful environment.  Didn't like living in [place].  Wants to lose weight and get tummy tuck down the road.     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 Would like to stay there at least 5-6 years in order for her to retire.  Depression and anxiety are exacerbated for the following reasons:  Work is stressful, dealing with colleagues and her boss, politics in the workplace  Lives with her daughter and grandkids. She is seeing their behavior and feels that she failed with them in some way  Feels her age and wants to find a partner (hasn't been dating)  Has unresolved trauma from childhood and grief.  Off mon tues wed for now    08/30/23:  Has new partner x 2 months. They met online. He's from [place]. Things have been going well. She has not gotten answers regarding partner's STI/HIV status. She will have this conversation with him.  Continued but stable stress from work and family. Not speaking with middle daughter who she lives with but on good terms with her oldest daughter.  Would like to start Cabenuva. Will touchbase in 3 months.  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 All others negative.      Medications and Allergies  She has a current medication list which includes the following prescription(s): amlodipine, genvoya, oxybutynin, pantoprazole, polyethylene glycol, simethicone, venlafaxine, and valacyclovir.    Allergies: Patient has no known allergies.      Family History  Her family history includes Asthma in her maternal aunt and maternal grandmother; COPD in her mother; Cancer in her maternal  aunt, maternal grandmother, and son; Depression in her mother; Diabetes in her brother and father; Heart disease in her maternal aunt and mother; Hypertension in her brother, father, and mother; Kidney disease in her paternal aunt and paternal uncle.              Objective:      BP 129/91 (BP Site: L Arm, BP Position: Sitting, BP Cuff Size: Large)  - Pulse 94  - Temp 36.1 ??C (97 ??F) (Temporal)  - Ht 172.7 cm (5' 8)  - Wt (!) 122.1 kg (269 lb 3.2 oz)  - BMI 40.93 kg/m??   Wt Readings from Last 3 Encounters:   08/30/23 (!) 122.1 kg (269 lb 3.2 oz)   05/03/23 (!) 123.4 kg (272 lb)   01/10/23 (!) 121.1 kg (267 lb)       Const looks well and attentive, alert, appropriate   Eyes sclerae anicteric, noninjected OU   ENT no thrush, leukoplakia or oral lesions   Lymph no cervical or supraclavicular LAD   CV RRR. No murmurs. No rub or gallop. S1/S2.   Lungs CTAB ant/post, normal work of breathing   GI Soft, no organomegaly. NTND. NABS.   GU deferred No CVA tenderness   Rectal deferred   Skin no petechiae, ecchymoses or obvious rashes on clothed exam   MSK no joint tenderness and normal ROM throughout   Neuro grossly intact   Psych Appropriate affect. Eye contact good. Linear thoughts. Fluent speech.     Laboratory Data  Reviewed in Epic today, using Synopsis and Chart Review filters.    Lab Results   Component Value Date    CREATININE 1.11 (H) 08/30/2023    CHOL 146 05/03/2023    HDL 57 05/03/2023    LDL 57 05/03/2023    NONHDL 89 05/03/2023    TRIG 295 (H) 05/03/2023    A1C 5.9 (H) 05/03/2023

## 2023-08-31 LAB — SYPHILIS SCREEN: SYPHILIS RPR SCREEN: NONREACTIVE

## 2023-08-31 LAB — HIV RNA, QUANTITATIVE, PCR
HIV RNA QNT RSLT: DETECTED — AB
HIV RNA: <20 {copies}/mL — ABNORMAL HIGH (ref <0)

## 2023-08-31 MED ORDER — VALACYCLOVIR 1 GRAM TABLET
ORAL_TABLET | Freq: Every day | ORAL | 3 refills | 90.00 days | Status: CP
Start: 2023-08-31 — End: 2023-08-31
  Filled 2023-09-23: qty 90, 90d supply, fill #0

## 2023-08-31 NOTE — Unmapped (Signed)
COVID Education:  Make sure you perform good hand washing (lasting 20 seconds), continue to social distance and limit close personal contact (which may include new sexual partners or having multiple partners during this period).  Try to isolate at home but please find ways to keep in touch with those close to you, such as meeting up with them electronically or socially distanced, and the ability to go outdoors alone or separated from others  If you become ill with fever, respiratory illness, sudden loss of taste and smell, stomach issues, diarrhea, nausea, vomiting - contact clinic for further instructions.  You should go to the emergency department if you develop systems such as shortness of breath, confusion, lightheadedness when standing, high fever.   Here is some information about HIV and CoVid vaccines: [email or web address]  If you're interested in receiving the CoVid vaccine when you're eligible, here are some resources for you to check and make an appointment:  Your local health department   [email or web address] through [facility]  [email or web address]  [facility] (if you are an established patient with them)  [email or web address]    URGENT CARE  Please call ahead to speak with the nursing staff if you are in need of an urgent appointment.       MEDICATIONS  For refills please contact your pharmacy and ask them to electronically send or fax the request to the clinic.   Please bring all medications in original bottles to every appointment.    HMAP (formerly ADAP) or [employer] Eligibility (required even if you do not receive medication through [facility]  Please remember to renew your Juanell Fairly eligibility during renewal periods which occur twice a year: January-March and July-September.     The following are needed for each renewal:   - [facility] Identification (if you don't have one, then a bill with your name and address in [place])   - proof of income (award letter, W-2, or last three check stubs)   If you are unable to come in for renewal, let us know if we can mail, fax or e-mail paperwork to you.   HMAP Contact: [phone].     Lab info:  Your most recent CD4 T-cell counts and viral loads are below. Here are a few things to keep in mind when looking at your numbers:  Our goal is to get your virus to be undetectable and keep it undetectable. If the virus is undetectable you are much more likely to stay healthy.  We consider your viral load to be undetectable if it says <40 or if it says Not detected.  For most people, we're checking CD4 counts every other visit (once or twice a year, or sometimes even less).  It's normal for your CD4 count to be different from visit to visit.   You can help by taking your medications at about the same time, every single day. If you're having trouble with taking your medications, it's important to let us know.    Lab Results   Component Value Date    ACD4 1,116 05/03/2023    CD4 62 (H) 05/03/2023    HIVCP <20 (H) 08/30/2023    HIVRS Detected (A) 08/30/2023        Please note that your laboratory and other results may be visible to you in real time, possibly before they reach your provider. Please allow 48 hours for clinical interpretation of these results. Importantly, even if a result is flagged as abnormal, it may not be one that  impacts your health.    It was nice to have a visit with you today!  Follow-up information:        Provider today:  Varney Daily, FNP-BC      ID CLINIC address:   [facility] Infectious Diseases [facility] at [facility]  [address]  [place], [place] [address]    Contact information:    The ID clinic phone number is [phone]   The ID clinic fax number is [phone]  For urgent issues on nights and weekends: Call the ID Physician on-call through the [facility] Operator at [phone].    Please sign up for My [facility] Chart - This is a great way to review your labs and track your appointments    Please try to arrive 30 minutes BEFORE your scheduled appointment time!  This will give you time to fill out any front desk paperwork needed for your visit, and allow you to be seen as close to your scheduled appointment time as possible.

## 2023-09-07 ENCOUNTER — Emergency Department (HOSPITAL_BASED_OUTPATIENT_CLINIC_OR_DEPARTMENT_OTHER)
Admission: EM | Admit: 2023-09-07 | Discharge: 2023-09-07 | Disposition: A | Discharge status: Home / Self Care | Payer: Commercial Managed Care - PPO | Attending: Emergency Medicine | Admitting: Emergency Medicine

## 2023-09-07 ENCOUNTER — Other Ambulatory Visit: Payer: Self-pay

## 2023-09-07 ENCOUNTER — Other Ambulatory Visit (HOSPITAL_BASED_OUTPATIENT_CLINIC_OR_DEPARTMENT_OTHER): Payer: Self-pay

## 2023-09-07 ENCOUNTER — Encounter (HOSPITAL_BASED_OUTPATIENT_CLINIC_OR_DEPARTMENT_OTHER): Payer: Self-pay | Admitting: Emergency Medicine

## 2023-09-07 DIAGNOSIS — M545 Low back pain, unspecified: Secondary | ICD-10-CM | POA: Diagnosis present

## 2023-09-07 DIAGNOSIS — M5442 Lumbago with sciatica, left side: Secondary | ICD-10-CM

## 2023-09-07 DIAGNOSIS — N3000 Acute cystitis without hematuria: Secondary | ICD-10-CM | POA: Diagnosis not present

## 2023-09-07 DIAGNOSIS — I10 Essential (primary) hypertension: Secondary | ICD-10-CM | POA: Insufficient documentation

## 2023-09-07 LAB — URINALYSIS, ROUTINE W REFLEX MICROSCOPIC
Bacteria, UA: NONE SEEN
Bilirubin Urine: NEGATIVE
Glucose, UA: NEGATIVE mg/dL
Ketones, ur: NEGATIVE mg/dL
Nitrite: NEGATIVE
Specific Gravity, Urine: 1.022 (ref 1.005–1.030)
pH: 6 (ref 5.0–8.0)

## 2023-09-07 MED ORDER — KETOROLAC TROMETHAMINE 15 MG/ML IJ SOLN
15.0000 mg | Freq: Once | INTRAMUSCULAR | Status: AC
  Administered 2023-09-07: 15 mg via INTRAMUSCULAR
  Filled 2023-09-07: qty 1

## 2023-09-07 MED ORDER — CEFADROXIL 500 MG PO CAPS
500.0000 mg | ORAL_CAPSULE | Freq: Two times a day (BID) | ORAL | 0 refills | Status: AC
  Filled 2023-09-07: qty 14, 7d supply, fill #0

## 2023-09-07 MED ORDER — METHOCARBAMOL 500 MG PO TABS
500.0000 mg | ORAL_TABLET | Freq: Two times a day (BID) | ORAL | 0 refills | Status: DC
  Filled 2023-09-07: qty 20, 10d supply, fill #0

## 2023-09-07 NOTE — Discharge Instructions (Signed)

## 2023-09-07 NOTE — ED Triage Notes (Signed)
Pt c/o LT lower back pain radiating into LLE x 1 week. Denies dysuria, endorses urinary freq.

## 2023-09-07 NOTE — ED Provider Notes (Signed)
[place] EMERGENCY DEPARTMENT AT [facility] Provider Note   CSN: [id number] Arrival date & time: 09/07/23  0941     History  Chief Complaint  Patient presents with   Back Pain    Kelli Stout is a 57 y.o. female with past medical history significant for hypertension, anxiety, UTI, previous diagnosis of lumbar radiculopathy who presents concern for left lower back pain radiating into the left lower extremity for around 1 week.  She denies any dysuria but does endorse some urinary frequency.  She reports that she has had issues with her sciatic nerve before.  She reports that she has tried nothing for pain other than 1 dose of her daughters ibuprofen 800 tablets at home.  She reports she did not have significant relief with that but has only tried it once.  She denies any history of cancer, chronic corticosteroid use, history of IV drug use.   Back Pain      Home Medications Prior to Admission medications   Medication Sig Start Date End Date Taking? Authorizing Provider  cefadroxil (DURICEF) 500 MG capsule Take 1 capsule (500 mg total) by mouth 2 (two) times daily. 09/07/23  Yes Nimah Uphoff H, PA-C  methocarbamol (ROBAXIN) 500 MG tablet Take 1 tablet (500 mg total) by mouth 2 (two) times daily. 09/07/23  Yes Cheryl Stabenow H, PA-C  estrogens, conjugated, (PREMARIN) 0.3 MG tablet TAKE 1 TABLET BY MOUTH DAILY Patient not taking: Reported on 07/25/2020 05/24/19   [provider]  famotidine (PEPCID) 40 MG tablet Take 40 mg by mouth daily. Patient not taking: Reported on 07/25/2020 09/28/19   [provider]  LORazepam (ATIVAN) 0.5 MG tablet Take 1 tablet (0.5 mg total) by mouth every 6 (six) hours as needed for anxiety. 07/25/20   Burchette, Elberta Fortis, MD  meloxicam (MOBIC) 7.5 MG tablet Take 1 tablet (7.5 mg total) by mouth 2 (two) times daily as needed for pain. 12/16/20   Tarry Kos, MD  pantoprazole (PROTONIX) 40 MG tablet Take 40 mg by  mouth daily. Patient not taking: Reported on 07/25/2020 09/28/19   [provider]  rilpivirine (ENDURANT) 25 MG TABS tablet Take 25 mg by mouth daily.    [provider]  solifenacin (VESICARE) 5 MG tablet Take 1 tablet (5 mg total) by mouth daily. 04/26/17   Burchette, Elberta Fortis, MD  triamterene-hydrochlorothiazide (MAXZIDE-25) 37.5-25 MG tablet TAKE 1/2 TABLET BY MOUTH DAILY 09/23/20   Burchette, Elberta Fortis, MD  TRUVADA 200-300 MG per tablet Take 1 tablet by mouth daily. Per Dr Corine Shelter 05/18/12   [provider]  valACYclovir (VALTREX) 500 MG tablet Take 500 mg by mouth daily.    [provider]  venlafaxine XR (EFFEXOR-XR) 150 MG 24 hr capsule TAKE ONE CAPSULE BY MOUTH DAILY 03/08/13   Burchette, Elberta Fortis, MD      Allergies    Patient has no known allergies.    Review of Systems   Review of Systems  Musculoskeletal:  Positive for back pain.  All other systems reviewed and are negative.   Physical Exam Updated Vital Signs BP 138/85 (BP Location: Right Arm)   Pulse 94   Temp 97.8 F (36.6 C)   Resp 16   SpO2 99%  Physical Exam Vitals and nursing note reviewed.  Constitutional:      General: She is not in acute distress.    Appearance: Normal appearance.  HENT:     Head: Normocephalic and atraumatic.  Eyes:  General:        Right eye: No discharge.        Left eye: No discharge.  Cardiovascular:     Rate and Rhythm: Normal rate and regular rhythm.     Heart sounds: No murmur heard.    No friction rub. No gallop.  Pulmonary:     Effort: Pulmonary effort is normal.     Breath sounds: Normal breath sounds.  Abdominal:     General: Bowel sounds are normal.     Palpations: Abdomen is soft.  Musculoskeletal:     Comments: Some tenderness palpation in the left lumbar paraspinous muscle distribution, intact strength 5/5 of bilateral lower extremities.  Positive straight leg raise on left.  Skin:    General: Skin is warm and dry.     Capillary  Refill: Capillary refill takes less than 2 seconds.  Neurological:     Mental Status: She is alert and oriented to person, place, and time.  Psychiatric:        Mood and Affect: Mood normal.        Behavior: Behavior normal.     ED Results / Procedures / Treatments   Labs (all labs ordered are listed, but only abnormal results are displayed) Labs Reviewed  URINALYSIS, ROUTINE W REFLEX MICROSCOPIC - Abnormal; Notable for the following components:      Result Value   Hgb urine dipstick TRACE (*)    Protein, ur TRACE (*)    Leukocytes,Ua MODERATE (*)    Non Squamous Epithelial 0-5 (*)    All other components within normal limits    EKG None  Radiology No results found.  Procedures Procedures    Medications Ordered in ED Medications  ketorolac (TORADOL) 15 MG/ML injection 15 mg (15 mg Intramuscular Given 09/07/23 1116)    ED Course/ Medical Decision Making/ A&P                                 Medical Decision Making Amount and/or Complexity of Data Reviewed Labs: ordered.  Risk Prescription drug management.   Patient with back pain.  My emergent differential diagnosis includes slipped disc, compression fracture, spondylolisthesis, less clinical concern for epidural abscess or osteomyelitis based on patient history.  Overall with high clinical suspicion for lumbar sprain, sciatica based on clinical presentation, risk factors.  No neurological deficits. Patient is ambulatory. No warning symptoms of back pain including: fecal incontinence, urinary retention or overflow incontinence, night sweats, waking from sleep with back pain, unexplained fevers or weight loss, h/o cancer, IVDU, recent trauma.  Overall low clinical concern for cauda equina, epidural abscess, or other serious cause of back pain.  Given this work-up, evaluation, physical exam I do not believe that radiographic imaging is indicated at this time.  I dependently interpreted UA which has moderate leukocytes, no  bacteria noted, there are few squamous cells so may be more of a contaminant sample but patient has endorsed some urinary frequency so I think would be reasonable to cover for urinary tract infection.  I discussed waiting versus treating presumptively with the patient and she prefers to treat at this time.  I think this is reasonable.  Will discharge with cefadroxil.  Conservative measures such as rest, ice/heat, ibuprofen, Tylenol, and  prescription for Robaxin indicated with orthopedic follow-up if no improvement with conservative management.  Extensive return precautions given, patient discharged in stable condition at this time.  Final Clinical  Impression(s) / ED Diagnoses Final diagnoses:  Acute left-sided low back pain with left-sided sciatica  Acute cystitis without hematuria    Rx / DC Orders ED Discharge Orders          Ordered    cefadroxil (DURICEF) 500 MG capsule  2 times daily        09/07/23 1145    methocarbamol (ROBAXIN) 500 MG tablet  2 times daily        09/07/23 1145              Ronasia Isola, [place], PA-C 09/07/23 1145    Linwood Dibbles, MD 09/07/23 1452

## 2023-09-15 DIAGNOSIS — I10 Essential (primary) hypertension: Principal | ICD-10-CM

## 2023-09-15 DIAGNOSIS — F32A Depression, unspecified depression type: Principal | ICD-10-CM

## 2023-09-15 MED ORDER — VENLAFAXINE ER 150 MG CAPSULE,EXTENDED RELEASE 24 HR
ORAL_CAPSULE | Freq: Every day | ORAL | 0 refills | 30.00 days
Start: 2023-09-15 — End: ?

## 2023-09-15 MED ORDER — AMLODIPINE 5 MG TABLET
ORAL_TABLET | Freq: Every day | ORAL | 0 refills | 30.00 days
Start: 2023-09-15 — End: ?

## 2023-09-16 MED ORDER — AMLODIPINE 5 MG TABLET
ORAL_TABLET | Freq: Every day | ORAL | 0 refills | 30 days | Status: CP
Start: 2023-09-16 — End: ?
  Filled 2023-09-23: qty 15, 30d supply, fill #0

## 2023-09-16 MED ORDER — VENLAFAXINE ER 150 MG CAPSULE,EXTENDED RELEASE 24 HR
ORAL_CAPSULE | Freq: Every day | ORAL | 0 refills | 30.00 days | Status: CP
Start: 2023-09-16 — End: ?
  Filled 2023-09-23: qty 30, 30d supply, fill #0

## 2023-09-16 NOTE — Unmapped (Signed)
[facility] Specialty and Home Delivery Pharmacy Refill Coordination Note    Specialty Medication(s) to be Shipped:   Infectious Disease: Genvoya    Other medication(s) to be shipped:  amlodipine, venlafaxine, valacyclovir, pantoprazole     Karen Strickland, DOB: 03/19/1966  Phone: [phone] (home)       All above HIPAA information was verified with patient.     Was a Nurse, learning disability used for this call? No    Completed refill call assessment today to schedule patient's medication shipment from the [facility] and [facility] Delivery Pharm[facility]  [phone]).  All relevant notes have been reviewed.     Specialty medication(s) and dose(s) confirmed: Regimen is correct and unchanged.   Changes to medications: Karen Strickland reports no changes at this time.  Changes to insurance: No  New side effects reported not previously addressed with a pharmacist or physician: None reported  Questions for the pharmacist: No    Confirmed patient received a Conservation officer, historic buildings and a Surveyor, mining with first shipment. The patient will receive a drug information handout for each medication shipped and additional FDA Medication Guides as required.       DISEASE/MEDICATION-SPECIFIC INFORMATION        N/A    SPECIALTY MEDICATION ADHERENCE     Medication Adherence    Patient reported X missed doses in the last month: 0  Specialty Medication: GENVOYA 150-150-200-10 mg Tab tablet (elvitegravir-cobicistat-emtricitabine-tenofovir)  Patient is on additional specialty medications: No  Patient is on more than two specialty medications: No  Any gaps in refill history greater than 2 weeks in the last 3 months: no  Demonstrates understanding of importance of adherence: yes  Informant: patient  Confirmed plan for next specialty medication refill: delivery by pharmacy  Refills needed for supportive medications: not needed          Refill Coordination    Has the Patients' Contact Information Changed: No  Is the Shipping Address Different: No         Were doses missed due to medication being on hold? No    GENVOYA 150-150-200-10   mg: 7 days of medicine on hand     REFERRAL TO PHARMACIST     Referral to the pharmacist: Not needed      [facility]     Shipping address confirmed in Epic.       Delivery Scheduled: Yes, Expected medication delivery date: 09/23/23.     Medication will be delivered via UPS to the prescription address in Epic WAM.    Karen Strickland   [facility] Special[facility] and Home Delivery Pharmacy  Specialty Technician

## 2023-09-23 MED FILL — PANTOPRAZOLE 40 MG TABLET,DELAYED RELEASE: ORAL | 30 days supply | Qty: 30 | Fill #3

## 2023-10-17 ENCOUNTER — Ambulatory Visit: Payer: Commercial Managed Care - PPO | Admitting: Podiatry

## 2023-10-25 DIAGNOSIS — F32A Depression, unspecified depression type: Principal | ICD-10-CM

## 2023-10-25 DIAGNOSIS — I10 Essential (primary) hypertension: Principal | ICD-10-CM

## 2023-10-25 MED ORDER — AMLODIPINE 5 MG TABLET
ORAL_TABLET | Freq: Every day | ORAL | 3 refills | 90.00 days
Start: 2023-10-25 — End: ?

## 2023-10-25 MED ORDER — VENLAFAXINE ER 150 MG CAPSULE,EXTENDED RELEASE 24 HR
ORAL_CAPSULE | Freq: Every day | ORAL | 3 refills | 90.00 days
Start: 2023-10-25 — End: ?

## 2023-10-25 NOTE — Unmapped (Signed)
[facility] Specialty and Home Delivery Pharmacy Clinical Assessment & Refill Coordination Note    Karen Strickland, DOB: 1966/04/28  Phone: [phone] (home)     All above HIPAA information was verified with patient.     Was a Nurse, learning disability used for this call? No    Specialty Medication(s):   Infectious Disease: Genvoya     Current Outpatient Medications   Medication Sig Dispense Refill    amlodipine (NORVASC) 5 MG tablet Take 0.5 tablets (2.5 mg total) by mouth daily. 15 tablet 0    elvitegravir-cobicistat-emtricitabine-tenofovir (GENVOYA) 150-150-200-10 mg Tab tablet Take 1 tablet by mouth daily. 90 tablet 3    oxybutynin (DITROPAN-XL) 10 MG 24 hr tablet Take 1 tablet (10 mg total) by mouth daily. 30 tablet 0    pantoprazole (PROTONIX) 40 MG tablet Take 1 tablet (40 mg total) by mouth daily. Needs clinic visit for additional refills 90 tablet 2    polyethylene glycol (MIRALAX) 17 gram packet Take 17 g by mouth daily. 30 packet 3    simethicone (GAS-X) 180 mg capsule Take 1 capsule (180 mg total) by mouth every six (6) hours as needed for flatulence. 90 capsule 0    valACYclovir (VALTREX) 1000 MG tablet Take 1 tablet (1,000 mg total) by mouth daily. 90 tablet 3    venlafaxine (EFFEXOR-XR) 150 MG 24 hr capsule Take 1 capsule (150 mg total) by mouth daily. 30 capsule 0     No current facility-administered medications for this visit.        Changes to medications: Karen Strickland reports no changes at this time.    Medication list has been reviewed and updated in Epic: Yes    No Known Allergies    Changes to allergies: No    Allergies have been reviewed and updated in Epic: Yes    SPECIALTY MEDICATION ADHERENCE     Genvoya 150-150-200-10 mg: 5 days of medicine on hand       Medication Adherence    Patient reported X missed doses in the last month: 0  Specialty Medication: Genovya 150-150-200-10mg  QD  Patient is on additional specialty medications: No  Informant: patient          Specialty medication(s) dose(s) confirmed: Regimen is correct and unchanged.     Are there any concerns with adherence? No    Adherence counseling provided? Not needed    CLINICAL MANAGEMENT AND INTERVENTION      Clinical Benefit Assessment:    Do you feel the medicine is effective or helping your condition? Yes    Clinical Benefit counseling provided? Labs from 08/2023 show evidence of clinical benefit    Adverse Effects Assessment:    Are you experiencing any side effects? No    Are you experiencing difficulty administering your medicine? No    Quality of Life Assessment:    Quality of Life    Rheumatology  Oncology  Dermatology  Cystic Fibrosis          How many days over the past month did your HIV  keep you from your normal activities? For example, brushing your teeth or getting up in the morning. Patient declined to answer    Have you discussed this with your provider? Not needed    Acute Infection Status:    Acute infections noted within Epic:  No active infections  Patient reported infection: None    Therapy Appropriateness:    Is therapy appropriate based on current medication list, adverse reactions, adherence, clinical benefit and progress toward  achieving therapeutic goals? Yes, therapy is appropriate and should be continued     HIV ASSOCIATED LABS:     Lab Results   Component Value Date/Time    HIVRS Detected (A) 08/30/2023 05:19 PM    HIVRS Detected (A) 05/03/2023 03:20 PM    HIVRS Detected (A) 09/15/2022 09:31 AM    HIVCP <20 (H) 08/30/2023 05:19 PM    HIVCP <20 (H) 05/03/2023 03:20 PM    HIVCP <20 (H) 09/15/2022 09:31 AM    ACD4 1,116 05/03/2023 03:20 PM    ACD4 855 09/15/2022 09:31 AM    ACD4 560 10/30/2021 01:43 PM         DISEASE/MEDICATION-SPECIFIC INFORMATION      N/A    HIV: Not Applicable    PATIENT SPECIFIC NEEDS     Does the patient have any physical, cognitive, or cultural barriers? No    Is the patient high risk? No    Did the patient require a clinical intervention? No    Does the patient require physician intervention or other additional services (i.e., nutrition, smoking cessation, social work)? No    Does the patient have an additional or emergency contact listed in their chart? Yes    SOCIAL DETERMINANTS OF HEALTH     At the [facility], we have learned that life circumstances - like trouble affording food, housing, utilities, or transportation can affect the health of many of our patients.   That is why we wanted to ask: are you currently experiencing any life circumstances that are negatively impacting your health and/or quality of life? Patient declined to answer    Social Drivers of Health     Food Insecurity: No Food Insecurity (01/10/2023)    Hunger Vital Sign     Worried About Running Out of Food in the Last Year: Never true     Ran Out of Food in the Last Year: Never true   Internet Connectivity: Not on file   Housing/Utilities: Low Risk  (04/24/2021)    Housing/Utilities     Within the past 12 months, have you ever stayed: outside, in a car, in a tent, in an overnight shelter, or temporarily in someone else's home (i.e. couch-surfing)?: No     Are you worried about losing your housing?: No     Within the past 12 months, have you been unable to get utilities (heat, electricity) when it was really needed?: No   Tobacco Use: Low Risk  (09/07/2023)    Received from [facility]    Patient History     Smoking Tobacco Use: Never     Smokeless Tobacco Use: Never     Passive Exposure: Not on file   Transportation Needs: No Transportation Needs (01/10/2023)    PRAPARE - Transportation     Lack of Transportation (Medical): No     Lack of Transportation (Non-Medical): No   Alcohol Use: Not At Risk (01/10/2023)    Alcohol Use     How often do you have a drink containing alcohol?: Never     How many drinks containing alcohol do you have on a typical day when you are drinking?: Not on file     How often do you have 5 or more drinks on one occasion?: Never   Interpersonal Safety: Not on file   Physical Activity: Not on file   Intimate Partner Violence: Not At Risk (01/10/2023)    Humiliation, Afraid, Rape, and Kick questionnaire     Fear of Current  or Ex-Partner: No     Emotionally Abused: No     Physically Abused: No     Sexually Abused: No   Stress: Not on file   Substance Use: Low Risk  (01/10/2023)    Substance Use     In the past year, how often have you used prescription drugs for non-medical reasons?: Never     In the past year, how often have you used illegal drugs?: Never     In the past year, have you used any substance for non-medical reasons?: No   Social Connections: Unknown (01/26/2022)    Received from [facility]    Social Network     Social Network: Not on file   Financial Resource Strain: Low Risk  (01/10/2023)    Overall Financial Resource Strain (CARDIA)     Difficulty of Paying Living Expenses: Not hard at all   Depression: Not at risk (04/20/2022)    [facility]    PHQ-2     Patient Health Questionnaire-2 Score: 0   Health Literacy: Not on file       Would you be willing to receive help with any of the needs that you have identified today? Not applicable       SHIPPING     Specialty Medication(s) to be Shipped:   Infectious Disease: Genvoya    Other medication(s) to be shipped:  venlafaxine, pantoprazole, amlodipine     Changes to insurance: No    Delivery Scheduled: Yes, Expected medication delivery date: 10/28/23.     Medication will be delivered via UPS to the confirmed prescription address in [facility]    The patient will receive a drug information handout for each medication shipped and additional FDA Medication Guides as required.  Verified that patient has previously received a Conservation officer, historic buildings and a Surveyor, mining.    The patient or caregiver noted above participated in the development of this care plan and knows that they can request review of or adjustments to the care plan at any time.      All of the patient's questions and concerns have been addressed.    Darryl Nestle, PharmD [facility] Specialty and Home Delivery Pharmacy Specialty Pharmacist

## 2023-10-26 MED ORDER — VENLAFAXINE ER 150 MG CAPSULE,EXTENDED RELEASE 24 HR
ORAL_CAPSULE | Freq: Every day | ORAL | 3 refills | 90.00 days | Status: CP
Start: 2023-10-26 — End: ?
  Filled 2023-10-27: qty 90, 90d supply, fill #0

## 2023-10-26 MED ORDER — AMLODIPINE 5 MG TABLET
ORAL_TABLET | Freq: Every day | ORAL | 3 refills | 90 days | Status: CP
Start: 2023-10-26 — End: ?
  Filled 2023-10-27: qty 45, 90d supply, fill #0

## 2023-10-27 MED FILL — PANTOPRAZOLE 40 MG TABLET,DELAYED RELEASE: ORAL | 30 days supply | Qty: 30 | Fill #4

## 2023-10-27 MED FILL — GENVOYA 150 MG-150 MG-200 MG-10 MG TABLET: ORAL | 30 days supply | Qty: 30 | Fill #1

## 2023-11-29 NOTE — Unmapped (Signed)
 [facility] Specialty and Home Delivery Pharmacy Refill Coordination Note    Specialty Medication(s) to be Shipped:   Infectious Disease: Biktarvy    Other medication(s) to be shipped: No additional medications requested for fill at this time     Karen Strickland, DOB: 13-Jan-1966  Phone: [phone] (home)       All above HIPAA information was verified with patient.     Was a Nurse, learning disability used for this call? No    Completed refill call assessment today to schedule patient's medication shipment from the [facility] and Home Delivery Pharmacy  [phone]).  All relevant notes have been reviewed.     Specialty medication(s) and dose(s) confirmed: Regimen is correct and unchanged.   Changes to medications: [name] reports no changes at this time.  Changes to insurance: No  New side effects reported not previously addressed with a pharmacist or physician: None reported  Questions for the pharmacist: No    Confirmed patient received a Conservation officer, historic buildings and a Surveyor, mining with first shipment. The patient will receive a drug information handout for each medication shipped and additional FDA Medication Guides as required.       DISEASE/MEDICATION-SPECIFIC INFORMATION        N/A    SPECIALTY MEDICATION ADHERENCE     Medication Adherence    Patient reported X missed doses in the last month: 0  Specialty Medication: GENVOYA 150-150-200-10 mg Tab tablet (elvitegravir-cobicistat-emtricitabine-tenofovir)  Patient is on additional specialty medications: No              Were doses missed due to medication being on hold? No    GENVOYA 150-150-200-10 mg Tab tablet (elvitegravir-cobicistat-emtricitabine-tenofovir): 5-6 days of medicine on hand     REFERRAL TO PHARMACIST     Referral to the pharmacist: Not needed      [facility]     Shipping address confirmed in Epic.     Cost and Payment: Patient has a $0 copay, payment information is not required.    Delivery Scheduled: Yes, Expected medication delivery date: 12/02/2023. Medication will be delivered via UPS to the prescription address in Epic WAM.    Delaine Lame   [facility] Specialty and Home Delivery Pharmacy  Specialty Technician

## 2023-12-01 MED FILL — GENVOYA 150 MG-150 MG-200 MG-10 MG TABLET: ORAL | 30 days supply | Qty: 30 | Fill #2

## 2023-12-02 NOTE — Unmapped (Signed)
 LMOM to schedule HTN follow up.

## 2023-12-13 NOTE — Unmapped (Unsigned)
 Internal Medicine Clinic Visit    Reason for visit: Leg Pain & Hof Flashes     A/P:    {TIP - [facility]- [facility] Pilot- Clinical Documentation Specialist Recommendations-  No specialty comments available.   This text will self delete upon signing note:[id number]}     No diagnosis found.    Leg Pain     2. Hot Flashes     3. ***    Health Maintenance:     Next Visit:  ***    No follow-ups on file.    Staffed with Dr. ***, {seen/discussed:75519}    Seleta Rhymes, MD  Internal Medicine, PGY-1  __________________________________________________________    HPI:    ***  __________________________________________________________        Medications:  Reviewed in EPIC  __________________________________________________________    Physical Exam:   Vital Signs:  There were no vitals filed for this visit.       PTHomeBP    The patient???s Average Home Blood Pressure during the last two weeks is :   /   based on  readings            Gen: Well appearing, NAD  CV: RRR, no murmurs  Pulm: CTA bilaterally, no crackles or wheezes  Abd: Soft, NTND, normal BS.   Ext: No edema  ***    PHQ-9 Score:     GAD-7 Score:       Medication adherence and barriers to the treatment plan have been addressed. Opportunities to optimize healthy behaviors have been discussed. Patient / caregiver voiced understanding.

## 2023-12-20 NOTE — Unmapped (Signed)
 [facility] Specialty and Home Delivery Pharmacy Refill Coordination Note    Specialty Medication(s) to be Shipped:   Infectious Disease: Genvoya    Other medication(s) to be shipped:  Pantoprazole       Karen Strickland, DOB: 21-Mar-1966  Phone: [phone] (home)       All above HIPAA information was verified with patient.     Was a Nurse, learning disability used for this call? No    Completed refill call assessment today to schedule patient's medication shipment from the [facility] and Home Delivery Pharmacy  [phone]).  All relevant notes have been reviewed.     Specialty medication(s) and dose(s) confirmed: Regimen is correct and unchanged.   Changes to medications: Chevette reports no changes at this time.  Changes to insurance: No  New side effects reported not previously addressed with a pharmacist or physician: None reported  Questions for the pharmacist: No    Confirmed patient received a Conservation officer, historic buildings and a Surveyor, mining with first shipment. The patient will receive a drug information handout for each medication shipped and additional FDA Medication Guides as required.       DISEASE/MEDICATION-SPECIFIC INFORMATION        N/A    SPECIALTY MEDICATION ADHERENCE     Medication Adherence    Patient reported X missed doses in the last month: 0  Specialty Medication: elvitegravir-cobicistat-emtricitabine-tenofovir: GENVOYA 150-150-200-10 mg Tab tablet  Patient is on additional specialty medications: No  Patient is on more than two specialty medications: No  Any gaps in refill history greater than 2 weeks in the last 3 months: no  Demonstrates understanding of importance of adherence: yes              Were doses missed due to medication being on hold? No        GENVOYA 150-150-200-10 mg Tab tablet: 5  days of medicine on hand        REFERRAL TO PHARMACIST     Referral to the pharmacist: Not needed      [facility]     Shipping address confirmed in Epic.     Cost and Payment: Patient has a $0 copay, payment information is not required. Last  copay  was  0.00       Delivery Scheduled: Yes, Expected medication delivery date: 12/27/23 .     Medication will be delivered via UPS to the prescription address in Epic WAM.    Ricci Barker   [facility] Specialty and Home Delivery Pharmacy  Specialty Technician

## 2023-12-26 MED FILL — GENVOYA 150 MG-150 MG-200 MG-10 MG TABLET: ORAL | 30 days supply | Qty: 30 | Fill #3

## 2023-12-26 MED FILL — PANTOPRAZOLE 40 MG TABLET,DELAYED RELEASE: ORAL | 30 days supply | Qty: 30 | Fill #5

## 2023-12-28 NOTE — Unmapped (Signed)
 INFECTIOUS DISEASES CLINIC  [address]  [place], [place]  [address]  P [phone]  F [phone]     Primary care provider: Consuelo Denmark, PA    Assessment/Plan:      HIV (dx'd 2004)  - chronic, stable  Health Department came to her home and told she needed testing.  Participated in study that started her on Edurant (RPV) and Truvada(FTC/TDF)-equivalent to Complera  Started on Odefsy which is covered on current insurance plan in 01/2021. (Patient's insurance did not cover for Complera. Odefsy is equivalent, but has TAF rather than TDF. Patient amenable to starting this.)    Overall doing well. Current regimen: Genvoya  (EVG/c/FTC/TAF)   Misses doses of ARVs never     Med access through insurance  CD4 count  see below  Discussed ARV adherence and taking ARVs with food    Lab Results   Component Value Date    ACD4 1,116 05/03/2023    CD4 62 (H) 05/03/2023    HIVRS Detected (A) 08/30/2023    HIVCP <20 (H) 08/30/2023     HIV RNA and safety labs (brief return panel)  Continue current therapy. Started on Genvoya  on 11/19/21  Discussed importance of ARV adherence  Previously discussed Cabenuva with patient and she is willing to come to clinic for injections but patient recently has canceled multiple appointments with me and has exacerbated depression and anxiety that could be a barrier to consistent attendance of appointments.   Plan is to have patient establish with psychiatry and therapy and once this is more stable, referral had been placed but was not able to establish as her insurance appeared to be out of network. Discussed this with patient who reports she now has [employer] through employer but never canceled [employer] which is showing up as primary insurance. Patient will cancel this as soon as possible, phone number given to healthcare.gov - [phone]   Once she is a good candidate for Cabenuva, will need monitoring with PHQ9.  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  Patient also has oral lesions, denies any genital lesions.  On daily Valacyclovir  with no outbreaks in 3-4 years, good adherence.  - Continue Valacyclovir  1 gram daily.      Urinary Tract Infection (UTI) - resolved  Recent UTI likely due to urine retention, no recent sexual activity.  Treated at local ED  - Encourage regular urination to prevent future UTIs.      Prediabetes - chronic  Aware of prediabetic status, advised to follow up with primary care.  - Follow up with primary care provider for management of prediabetes.   - Needs to make appointment with PCP      Bone Health  At risk for osteoporosis due to age, bone scan needed.  - Order bone scan to assess for osteoporosis.  - Phone number provided to schedule appointment.      History of CoVid infection (02/2019) - acute, self-limited  Symptoms included: Fever, body aches. Denies loss of taste or smell, coughing, GI complaints  Symptoms resolved.      Chronic conditions managed by PCP, Ozzie Board, PA-C:  Patient has not been able to have appointment with PCP and running out of medications. I have sent in 3 months supply of medications to bridge until her next appointment with PCP. She states she will call to make appointment.  Hypertension  - chronic, stable -  121/87 today, Continue Amlodipine    Depression and Anxiety - chronic, stable - Reports good mental state, therapy arranged pending appointment confirmation. Planning to make an appointment. On Effexor    Obesity - BMI 40.61 - chronic - Working on diet and exercise. Would like a tummy tuck in the future once she loses weight. Will need to keep in mind, weight neutral ART regimen if needing to change ART. Plans to start evening exercise - Encouraged regular physical activity, such as using an elliptical for 30-40 minutes.      Sexual health & secondary prevention  - chronic, stable  Not in relationship.  LSE abstinent  Parts of body used during sex include: mouth and vagina. Does not have anal sex. Gives and receives oral sex. Receptive partner for vaginal sex.   In past 6 months has had no sex and has not had add'l STI screening.  She  uses condoms  She does routinely discuss HIV status with partner(s).  Have not discussed interest in having children.    Lab Results   Component Value Date    RPR Nonreactive 08/30/2023    RPR Nonreactive 05/03/2023    CTNAA Negative 08/30/2023    GCNAA Negative 08/30/2023    SPECSOURCE Patient-collected Vaginal Swab 08/30/2023     GC/CT NAATs - obtained today from all exposed anatomical site(s)  RPR - for screening obtained today      Health maintenance  - chronic, stable  PCP: Consuelo Denmark, PA-C   Patient needs appointment. I stressed importance of following up with PCP.    Oral health  She does  have a dentist. Last dental exam 09/2020.    Eye health  She does  use corrective lenses. Last eye exam 08/2020. Early glaucoma    Metabolic conditions  Wt Readings from Last 5 Encounters:   12/29/23 (!) 118.3 kg (260 lb 12.8 oz)   08/30/23 (!) 122.1 kg (269 lb 3.2 oz)   05/03/23 (!) 123.4 kg (272 lb)   01/10/23 (!) 121.1 kg (267 lb)   09/15/22 (!) 121.1 kg (267 lb)     Lab Results   Component Value Date    CREATININE  12/29/2023      Comment:      Hemolyzed specimen.     GLUCOSEU Negative 09/15/2022    ALBCRERAT 8.4 12/29/2023    GLU 101 12/29/2023    A1C 5.9 (H) 05/03/2023    ALT 36 12/29/2023    ALT 32 08/30/2023    ALT 24 05/03/2023     # Kidney health - SCr and eGFR  UACR  # Bone health - DEXA orders placed  # Diabetes assessment - random glucose today  # NAFLD assessment - monitor over time    Communicable diseases  Lab Results   Component Value Date    HEPAIGG Reactive (A) 04/24/2021    HEPBSAB Reactive (A) 04/24/2021     # TB screening - no longer needed; negative IGRA, low risk 01/04/2019  # Hepatitis screening -  as noted:  (07/21/20) HepB sAg NR, HepC Ab NR, (04/24/21) HepB sAb REACTIVE, HepB cAb NR, HepA REACTIVE Hep A/B IMMUNE  # MMR screening -  01/04/19 Measles immune    Cancer screening  No results found for: PSASCRN, PSA, PAP, FINALDX  # Cervical -  Managed by GYN  # Breast -  Managed by GYN    # Anorectal - anal cytology w/hrHPV co-testing (non-reflex), obtained today.  # Colorectal -  Defer to PCP  #  Liver - no screening indicated  # Lung - screening not indicated    Cardiovascular disease  Lab Results   Component Value Date    CHOL 146 05/03/2023    HDL 57 05/03/2023    LDL 57 05/03/2023    NONHDL 89 05/03/2023    TRIG 595 (H) 05/03/2023     # The 10-year ASCVD risk score (Arnett DK, et al., 2019) is: 3.7%  - is not taking aspirin   - is not taking statin  - BP control good  - never smoker  # AAA screening - no indication for screening    Immunization History   Administered Date(s) Administered    COVID-19 VAC,BIVALENT,MODERNA(BLUE CAP) 03/02/2022    COVID-19 VACC,MRNA(BOOSTER)OR(6-52YR)MODERNA 02/11/2021    COVID-19 VACCINE,MRNA(MODERNA)(PF) 02/13/2020, 03/12/2020    Covid-19 Vac, (36yr+) (Spikevax) Monovalent Moderna 09/15/2022    DTaP, Unspecified Formulation 03/09/1966, 04/08/1966, 05/10/1966, 05/17/1968    Hepatitis B vaccine, pediatric/adolescent dosage, 05/17/2003, 06/18/2003, 11/22/2003    INFLUENZA TIV (TRI) 78MO+ W/ PRESERV (IM) 09/27/2011, 06/07/2012, 07/20/2012, 06/08/2013, 06/03/2015, 08/06/2019    INFLUENZA VACCINE IIV3(IM)(PF)6 MOS UP 08/30/2023    Influenza Vaccine Quad (IIV4 W/PRESERV) 78MO+ 09/27/2011, 06/07/2012, 06/08/2013, 06/03/2015, 08/06/2019    Influenza Vaccine Quad(IM)6 MO-Adult(PF) 06/03/2015, 08/06/2019, 06/25/2020, 10/30/2021, 09/15/2022    Influenza Virus Vaccine, unspecified formulation 09/27/2011, 06/07/2012, 07/20/2012, 06/08/2013, 06/03/2015    Measles 04/12/1969    PNEUMOCOCCAL POLYSACCHARIDE 23-VALENT 07/20/2005, 01/08/2011    PPD Test 11/17/2004, 01/20/2006, 01/08/2011, 02/04/2012    Pneumococcal Conjugate 13-Valent 10/27/2009, 02/04/2012 Pneumococcal Conjugate 20-valent 10/30/2021    Pneumococcal vaccine, Unspecified Formulation 04/02/2003    Polio Virus Vaccine, Unspecified Formulation 03/09/1966, 05/10/1966, 05/17/1968    Rubella 12/26/1980    TD(TDVAX),ADSORBED,2LF(IM)(PF) 04/02/2003    TD, ADSORBED, 5LF TETANUS TOXOID, PF(TENIVAC/DECAVAC) 09/20/2002    TdaP 10/27/2008, 01/08/2011    Tetanus 09/08/1982    Zoster Vaccine, Unspecified Formulation 08/06/2019, 10/08/2019     Immunizations today - no immunizations needed today but will need newest flu/CoVid at next visit.  Needs Menveo      I personally spent 35 minutes face-to-face and non-face-to-face in the care of this patient, which includes all pre, intra, and post visit time on the date of service.  All documented time was specific to the E/M visit and does not include any procedures that may have been performed.      Disposition  Next appointment: 5-6 months       To do @ next RTC  Anal pap - next visit  Flu and CoVid  Established with Psych and Therapy?  Hep C Ab      Lindi Revering, FNP-BC  [facility] Infectious Diseases Clinic at [facility]  [address], [place], [place] [address]    Phone: [phone]   Fax: [phone]           Subjective      Chief Complaint   Routine HIV follow up    12/29/23  History of Present Illness  Karen Strickland is a 58 year old female with HIV who presents for routine follow-up.    She is currently managed with Genvoya  for her HIV, which she tolerates well without any issues. She has been adherent to her medication regimen and finds her prescriptions effective, with no recent side effects or complications related to her HIV management.    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 No recent sexual activity, with the last occurrence being last year.  She has not had a herpes outbreak in three to four years and is taking Valtrex  daily. There was a period when it was difficult to obtain the medication, but she has been stable since then.    She is prediabetic and acknowledges the need to manage this condition. 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    She is concerned about aging-related issues, mentioning fatigue despite being very active. She attributes some of her tiredness to aging and plans to start working out to improve her energy levels. She is taking vitamins and has been losing weight, aiming to reach 230 pounds.    She is considering starting therapy and has identified a therapist, although she needs to confirm the appointment. She reports being in a good mental state, saying she is at a good place.    She expresses concern about her bone health and mentions the need for a bone scan to check for osteoporosis. She also mentions paranoia about her health, wanting to ensure everything is fine as she plans for retirement in seven years.    She lives in [place] and prefers to keep her healthcare providers in her current location. She continues to live with daughter and is working on paying off debts from [employer].      08/30/23:  Has new partner x 2 months. They met online. He's from [place]. Things have been going well. She has not gotten answers regarding partner's STI/HIV status. She will have this conversation with him.  Continued but stable stress from work and family. Not speaking with middle daughter who she lives with but on good terms with her oldest daughter.  Would like to start Cabenuva. Will touchbase in 3 months.  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 All others negative.      Medications and Allergies  She has a current medication list which includes the following prescription(s): amlodipine , oxybutynin , pantoprazole , polyethylene glycol, simethicone , venlafaxine , genvoya , and valacyclovir .    Allergies: Patient has no known allergies.      Family History  Her family history includes Asthma in her maternal aunt and maternal grandmother; COPD in her mother; Cancer in her maternal aunt, maternal grandmother, and son; Depression in her mother; Diabetes in her brother and father; Heart disease in her maternal aunt and mother; Hypertension in her brother, father, and mother; Kidney disease in her paternal aunt and paternal uncle.              Objective:      BP 134/88 (BP Site: L Arm, BP Position: Sitting, BP Cuff Size: Large)  -  Pulse 97  - Temp 36.2 ??C (97.2 ??F) (Temporal)  - Resp 18  - Ht 172.7 cm (5' 8)  - Wt (!) 118.3 kg (260 lb 12.8 oz)  - BMI 39.65 kg/m??   Wt Readings from Last 3 Encounters:   12/29/23 (!) 118.3 kg (260 lb 12.8 oz)   08/30/23 (!) 122.1 kg (269 lb 3.2 oz)   05/03/23 (!) 123.4 kg (272 lb)       Const looks well and attentive, alert, appropriate   Eyes sclerae anicteric, noninjected OU   ENT no thrush, leukoplakia or oral lesions   Lymph no cervical or supraclavicular LAD   CV RRR. No murmurs. No rub or gallop. S1/S2.   Lungs CTAB ant/post, normal work of breathing   GI Soft, no organomegaly. NTND. NABS.   GU deferred No CVA tenderness   Rectal normal by inspection, normal tone, no masses, and anal pap obtained without incident. Karen Gottron RN, FNP student, present for exam and assisted with  exam.   Skin no petechiae, ecchymoses or obvious rashes on clothed exam   MSK no joint tenderness and normal ROM throughout   Neuro grossly intact   Psych Appropriate affect. Eye contact good. Linear thoughts. Fluent speech.     Laboratory Data  Reviewed in Epic today, using Synopsis and Chart Review filters.    Lab Results   Component Value Date    CREATININE  12/29/2023      Comment:      Hemolyzed specimen.     CHOL 146 05/03/2023    HDL 57 05/03/2023    LDL 57 05/03/2023    NONHDL 89 05/03/2023    TRIG 644 (H) 05/03/2023    A1C 5.9 (H) 05/03/2023

## 2023-12-29 ENCOUNTER — Ambulatory Visit: Admit: 2023-12-29 | Discharge: 2023-12-30 | Attending: Family | Primary: Family

## 2023-12-29 DIAGNOSIS — Z9189 Other specified personal risk factors, not elsewhere classified: Principal | ICD-10-CM

## 2023-12-29 DIAGNOSIS — A6000 Herpesviral infection of urogenital system, unspecified: Principal | ICD-10-CM

## 2023-12-29 DIAGNOSIS — Z79899 Other long term (current) drug therapy: Principal | ICD-10-CM

## 2023-12-29 DIAGNOSIS — B20 Human immunodeficiency virus [HIV] disease: Principal | ICD-10-CM

## 2023-12-29 DIAGNOSIS — Z1151 Encounter for screening for human papillomavirus (HPV): Principal | ICD-10-CM

## 2023-12-29 DIAGNOSIS — Z1210 Encounter for screening for malignant neoplasm of intestinal tract, unspecified: Principal | ICD-10-CM

## 2023-12-29 DIAGNOSIS — Z5181 Encounter for therapeutic drug level monitoring: Principal | ICD-10-CM

## 2023-12-29 LAB — BASIC METABOLIC PANEL
ANION GAP: 15 mmol/L — ABNORMAL HIGH (ref 5–14)
CALCIUM: 9.6 mg/dL (ref 8.7–10.4)
CHLORIDE: 98 mmol/L (ref 98–107)
CO2: 27.4 mmol/L (ref 20.0–31.0)
GLUCOSE RANDOM: 101 mg/dL (ref 70–179)
SODIUM: 140 mmol/L (ref 135–145)

## 2023-12-29 LAB — ALT: ALT (SGPT): 36 U/L (ref 10–49)

## 2023-12-29 LAB — ALBUMIN / CREATININE URINE RATIO
ALBUMIN QUANT URINE: 2.2 mg/dL
ALBUMIN/CREATININE RATIO: 8.4 ug/mg (ref 0.0–30.0)
CREATININE, URINE: 262.5 mg/dL

## 2023-12-29 LAB — CBC W/ AUTO DIFF
BASOPHILS ABSOLUTE COUNT: 0.1 10*9/L (ref 0.0–0.1)
BASOPHILS RELATIVE PERCENT: 0.8 %
EOSINOPHILS ABSOLUTE COUNT: 0.1 10*9/L (ref 0.0–0.5)
EOSINOPHILS RELATIVE PERCENT: 1.2 %
HEMATOCRIT: 38 % (ref 34.0–44.0)
HEMOGLOBIN: 12.8 g/dL (ref 11.3–14.9)
LYMPHOCYTES ABSOLUTE COUNT: 1.9 10*9/L (ref 1.1–3.6)
LYMPHOCYTES RELATIVE PERCENT: 28.8 %
MEAN CORPUSCULAR HEMOGLOBIN CONC: 33.8 g/dL (ref 32.0–36.0)
MEAN CORPUSCULAR HEMOGLOBIN: 29.7 pg (ref 25.9–32.4)
MEAN CORPUSCULAR VOLUME: 87.8 fL (ref 77.6–95.7)
MEAN PLATELET VOLUME: 8.2 fL (ref 6.8–10.7)
MONOCYTES ABSOLUTE COUNT: 0.5 10*9/L (ref 0.3–0.8)
MONOCYTES RELATIVE PERCENT: 7.1 %
NEUTROPHILS ABSOLUTE COUNT: 4.2 10*9/L (ref 1.8–7.8)
NEUTROPHILS RELATIVE PERCENT: 62.1 %
PLATELET COUNT: 384 10*9/L (ref 150–450)
RED BLOOD CELL COUNT: 4.33 10*12/L (ref 3.95–5.13)
RED CELL DISTRIBUTION WIDTH: 14.3 % (ref 12.2–15.2)
WBC ADJUSTED: 6.7 10*9/L (ref 3.6–11.2)

## 2023-12-29 LAB — BILIRUBIN, TOTAL: BILIRUBIN TOTAL: 0.4 mg/dL (ref 0.3–1.2)

## 2023-12-29 LAB — AST

## 2023-12-29 MED ORDER — GENVOYA 150 MG-150 MG-200 MG-10 MG TABLET
ORAL_TABLET | Freq: Every day | ORAL | 3 refills | 90.00 days | Status: CP
Start: 2023-12-29 — End: ?
  Filled 2024-01-19: qty 90, 90d supply, fill #0

## 2023-12-29 MED ORDER — VALACYCLOVIR 1 GRAM TABLET
ORAL_TABLET | Freq: Every day | ORAL | 3 refills | 90.00 days | Status: CP
Start: 2023-12-29 — End: ?
  Filled 2024-01-04: qty 90, 90d supply, fill #0

## 2023-12-30 LAB — HIV RNA, QUANTITATIVE, PCR
HIV RNA QNT RSLT: DETECTED — AB
HIV RNA: <20 {copies}/mL — ABNORMAL HIGH (ref <0)

## 2023-12-30 NOTE — Unmapped (Signed)
 COVID Education:  Make sure you perform good hand washing (lasting 20 seconds), continue to social distance and limit close personal contact (which may include new sexual partners or having multiple partners during this period).  Try to isolate at home but please find ways to keep in touch with those close to you, such as meeting up with them electronically or socially distanced, and the ability to go outdoors alone or separated from others  If you become ill with fever, respiratory illness, sudden loss of taste and smell, stomach issues, diarrhea, nausea, vomiting - contact clinic for further instructions.  You should go to the emergency department if you develop systems such as shortness of breath, confusion, lightheadedness when standing, high fever.   Here is some information about HIV and CoVid vaccines: [email or web address]  If you're interested in receiving the CoVid vaccine when you're eligible, here are some resources for you to check and make an appointment:  Your local health department   [email or web address] through [facility]  [email or web address]  [facility] (if you are an established patient with them)  [email or web address]    URGENT CARE  Please call ahead to speak with the nursing staff if you are in need of an urgent appointment.       MEDICATIONS  For refills please contact your pharmacy and ask them to electronically send or fax the request to the clinic.   Please bring all medications in original bottles to every appointment.    HMAP (formerly ADAP) or [employer] Eligibility (required even if you do not receive medication through [facility]  Please remember to renew your Juanell Fairly eligibility during renewal periods which occur twice a year: January-March and July-September.     The following are needed for each renewal:   - [facility] Identification (if you don't have one, then a bill with your name and address in [place])   - proof of income (award letter, W-2, or last three check stubs)   If you are unable to come in for renewal, let us know if we can mail, fax or e-mail paperwork to you.   HMAP Contact: [phone].     Lab info:  Your most recent CD4 T-cell counts and viral loads are below. Here are a few things to keep in mind when looking at your numbers:  Our goal is to get your virus to be undetectable and keep it undetectable. If the virus is undetectable you are much more likely to stay healthy.  We consider your viral load to be undetectable if it says <40 or if it says Not detected.  For most people, we're checking CD4 counts every other visit (once or twice a year, or sometimes even less).  It's normal for your CD4 count to be different from visit to visit.   You can help by taking your medications at about the same time, every single day. If you're having trouble with taking your medications, it's important to let us know.    Lab Results   Component Value Date    ACD4 1,116 05/03/2023    CD4 62 (H) 05/03/2023    HIVCP <20 (H) 08/30/2023    HIVRS Detected (A) 08/30/2023        Please note that your laboratory and other results may be visible to you in real time, possibly before they reach your provider. Please allow 48 hours for clinical interpretation of these results. Importantly, even if a result is flagged as abnormal, it may not be one that  impacts your health.    It was nice to have a visit with you today!  Follow-up information:        Provider today:  Varney Daily, FNP-BC      ID CLINIC address:   [facility] Infectious Diseases Clinic at [facility]  [address]  [place], [place] [address]    Contact information:    The ID clinic phone number is [phone]   The ID clinic fax number is [phone]  For urgent issues on nights and weekends: Call the ID Physician on-call through the [facility] Operator at [phone].    Please sign up for My [facility] Chart - This is a great way to review your labs and track your appointments    Please try to arrive 30 minutes BEFORE your scheduled appointment time!  This will give you time to fill out any front desk paperwork needed for your visit, and allow you to be seen as close to your scheduled appointment time as possible.

## 2024-01-05 NOTE — Unmapped (Signed)
 PROMIS Tablet Screening  Completed Date: 12/29/2023    SW reviewed self-administered screening.    Patient had a PHQ-9 score of 7  indicating Mild depression.   Pt denies SI.   Pt scored 0 on AUDIT/AUDIT-C indicating Not-at-risk alcohol consumption  Pt  denies substance use in past 3 months.  Pt denies concerns for IPV.    Pt seen by provider for regular ID visit.  Pt was not seen in person by Social Work during this visit.     Dena Finer, LCSW-A  [facility] ID Clinic Social Work

## 2024-01-12 ENCOUNTER — Inpatient Hospital Stay: Admit: 2024-01-12 | Discharge: 2024-01-13 | Payer: PRIVATE HEALTH INSURANCE

## 2024-01-16 NOTE — Unmapped (Addendum)
 I reviewed this patient case and all documentation provided by the learner and was readily available for consultation during their interaction with the patient.  I agree with the assessment and plan listed below.    Riesa Chary, PharmD   [facility] Specialty and Home Delivery Pharm[facility] Specialty Pharmacist      [facility] Specialty and Home Delivery Pharmacy Refill Coordination Note    Specialty Medication(s) to be Shipped:   Infectious Disease: Genvoya    Other medication(s) to be shipped:  pantoprazole, amlodipine, venlafaxine     Karen Strickland, DOB: [birth year]  Phone: [phone] (home)       All above HIPAA information was verified with patient.     Was a Nurse, learning disability used for this call? No    Completed refill call assessment today to schedule patient's medication shipment from the [facility] and Home Delivery Pharmacy  [phone]).  All relevant notes have been reviewed.     Specialty medication(s) and dose(s) confirmed: Regimen is correct and unchanged.   Changes to medications: [name] reports no changes at this time.  Changes to insurance: No  New side effects reported not previously addressed with a pharmacist or physician: None reported  Questions for the pharmacist: No    Confirmed patient received a Conservation officer, historic buildings and a Surveyor, mining with first shipment. The patient will receive a drug information handout for each medication shipped and additional FDA Medication Guides as required.       DISEASE/MEDICATION-SPECIFIC INFORMATION        N/A    SPECIALTY MEDICATION ADHERENCE     Medication Adherence    Patient reported X missed doses in the last month: 0  Specialty Medication: Genvoya  Informant: patient  Confirmed plan for next specialty medication refill: delivery by pharmacy  Refills needed for supportive medications: not needed          Refill Coordination    Has the Patients' Contact Information Changed: No  Is the Shipping Address Different: No         Were doses missed due to medication being on hold? No    Genvoya 150/150/200/10 mg: estimates 5-6 days of medicine on hand       REFERRAL TO PHARMACIST     Referral to the pharmacist: Not needed      [facility]     Shipping address confirmed in Epic.     Cost and Payment: Patient has a $0 copay, payment information is not required.    Delivery Scheduled: Yes, Expected medication delivery date: 01/20/24.     Medication will be delivered via UPS to the prescription address in Epic WAM.    [name]   [facility] Specialty and Home Delivery Pharmacy  Specialty  Pharmacy Student

## 2024-01-19 MED FILL — VENLAFAXINE ER 150 MG CAPSULE,EXTENDED RELEASE 24 HR: ORAL | 90 days supply | Qty: 90 | Fill #1

## 2024-01-19 MED FILL — PANTOPRAZOLE 40 MG TABLET,DELAYED RELEASE: ORAL | 30 days supply | Qty: 30 | Fill #6

## 2024-01-19 MED FILL — AMLODIPINE 5 MG TABLET: ORAL | 90 days supply | Qty: 45 | Fill #1

## 2024-03-06 ENCOUNTER — Ambulatory Visit: Admitting: Podiatry

## 2024-03-15 ENCOUNTER — Ambulatory Visit: Admitting: Podiatry

## 2024-03-15 ENCOUNTER — Ambulatory Visit (INDEPENDENT_AMBULATORY_CARE_PROVIDER_SITE_OTHER)

## 2024-03-15 ENCOUNTER — Encounter: Payer: Self-pay | Admitting: Podiatry

## 2024-03-15 VITALS — Ht 68.0 in | Wt 250.0 lb

## 2024-03-15 DIAGNOSIS — M2012 Hallux valgus (acquired), left foot: Secondary | ICD-10-CM | POA: Diagnosis not present

## 2024-03-15 DIAGNOSIS — L600 Ingrowing nail: Secondary | ICD-10-CM

## 2024-03-15 DIAGNOSIS — M7751 Other enthesopathy of right foot: Secondary | ICD-10-CM | POA: Diagnosis not present

## 2024-03-15 DIAGNOSIS — M7752 Other enthesopathy of left foot: Secondary | ICD-10-CM

## 2024-03-15 DIAGNOSIS — M2011 Hallux valgus (acquired), right foot: Secondary | ICD-10-CM

## 2024-03-15 MED ORDER — NEOMYCIN-POLYMYXIN-HC 3.5-10000-1 OT SUSP: OTIC | 0 refills | Status: AC

## 2024-03-15 MED ORDER — NEOMYCIN-POLYMYXIN-HC 3.5-10000-1 OT SUSP: OTIC | 0 refills | Status: DC

## 2024-03-15 NOTE — Progress Notes (Signed)
 Subjective:  Patient ID: Kelli Stout, female    DOB: 15-Feb-1966,  MRN: [id number]  Chief Complaint  Patient presents with   Foot Pain    Pt is here due to bilateral foot pain that has been going on for for a while pt states that she has pain in both great toe pain is worst in the left toe states she has to wear bigger shoes due to pain has recurrent ingrown in left great toe been going on for 17 years    Discussed the use of AI scribe software for clinical note transcription with the patient, who gave verbal consent to proceed.  History of Present Illness Kelli Stout is a 58 year old female who presents with persistent pain in the left big toe following a toenail removal procedure.  She has experienced ongoing pain in her left big toe, specifically at the toenail, for 18 years following a toenail removal procedure. The pain is localized to the toenail area and is exacerbated by pressure. There has been no improvement in the pain since the initial procedure.  She also has a bunion on her left foot, which is currently asymptomatic. Additionally, she has a history of hammer toe surgery on the second toe of her left foot. She inquires about the appearance of her toes, which tip inward, and questions if this is a hereditary condition affecting the alignment of the bones in her feet.  No pain in the right foot. No known allergies. Her primary concern is alleviating the pain in her left big toe.      Objective:    Physical Exam VASCULAR: DP and PT pulse palpable. Foot is warm and well-perfused. Capillary fill time is brisk. DERMATOLOGIC: Normal skin turgor, texture, and temperature. No open lesions, rashes, or ulcerations. Ingrown nail on left hallux medial border, no paronychia. NEUROLOGIC: Normal sensation to light touch and pressure. No paresthesias on examination. ORTHOPEDIC: Limited range of motion of first and second MTP joints. Prominent dorsal metatarsal head on left  foot. Mild metatarsus adductus. Previous hammer toe surgery on second toe. No severe hallux valgus.  EXTREMITIES: Feet warm and well-perfused bilaterally.   No images are attached to the encounter.    Results Procedure: Partial Permanent Matricectomy Description: Local anesthesia was administered to the left hallux with 1.5 cc of 2% lidocaine  and 0.5% Marcaine  plain.  The nail and toe was prepped with Betadine and exsanguinated and tourniquet secured around the base of the toe. The medial border of the ingrown toenail was excised.  Phenol matricectomy was applied 3 applications for 30 seconds each Informed Consent: Counseling included the risks of damaging the nail matrix, potential for the toenail to grow back thicker or misshapen, and the benefits of alleviating pain. Alternatives discussed included complete toenail avulsion if pain persists.   Assessment:   1. Ingrowing left great toenail   2. Hallux valgus, bilateral      Plan:  Patient was evaluated and treated and all questions answered.  Assessment and Plan Assessment & Plan Ingrown toenail, left hallux Chronic ingrown toenail on the left hallux medial border, persisting for 18 years. The nail is sore and painful upon pressure, particularly on the inside edge. Previous attempts to remove the nail were unsuccessful in alleviating symptoms. The toenail is well attached, and complete removal could damage the root, potentially leading to abnormal regrowth. - Perform partial permanent matricectomy on the left hallux medial border today.  As noted above - If symptoms persist, consider total  permanent matricectomy to remove the entire toenail and nail bed.  Hallux limitus with dorsal bunion Presence of a dorsal bunion on the left foot with limited range of motion in the first MTP joint. The bunion is not currently causing pain or significant symptoms. Surgical intervention is complex due to the involvement of multiple bones and is not  recommended unless symptoms worsen. - Monitor the condition and recommend nonoperative treatment for now.  Metatarsus adductus Mild metatarsus adductus, a hereditary condition where the long bones and toes tilt inward. This contributes to the foot shape but is relatively asymptomatic at present. - Continue to monitor the condition.      Return if symptoms worsen or fail to improve.

## 2024-03-15 NOTE — Patient Instructions (Signed)

## 2024-03-17 ENCOUNTER — Other Ambulatory Visit: Payer: Self-pay

## 2024-03-17 ENCOUNTER — Encounter (HOSPITAL_COMMUNITY): Payer: Self-pay

## 2024-03-17 ENCOUNTER — Emergency Department (HOSPITAL_COMMUNITY)
Admission: EM | Admit: 2024-03-17 | Discharge: 2024-03-17 | Disposition: A | Discharge status: Home / Self Care | Attending: Emergency Medicine | Admitting: Emergency Medicine

## 2024-03-17 DIAGNOSIS — T63441A Toxic effect of venom of bees, accidental (unintentional), initial encounter: Secondary | ICD-10-CM | POA: Insufficient documentation

## 2024-03-17 DIAGNOSIS — I10 Essential (primary) hypertension: Secondary | ICD-10-CM | POA: Diagnosis not present

## 2024-03-17 DIAGNOSIS — Z79899 Other long term (current) drug therapy: Secondary | ICD-10-CM | POA: Insufficient documentation

## 2024-03-17 MED ORDER — IBUPROFEN 400 MG PO TABS
400.0000 mg | ORAL_TABLET | Freq: Once | ORAL | Status: AC | PRN
  Administered 2024-03-17: 400 mg via ORAL
  Filled 2024-03-17: qty 1

## 2024-03-17 MED ORDER — DIPHENHYDRAMINE HCL 25 MG PO CAPS
25.0000 mg | ORAL_CAPSULE | Freq: Once | ORAL | Status: AC
  Administered 2024-03-17: 25 mg via ORAL
  Filled 2024-03-17: qty 1

## 2024-03-17 MED ORDER — EPINEPHRINE 0.3 MG/0.3ML IJ SOAJ: 0.3000 mg | INTRAMUSCULAR | 0 refills | Status: AC | PRN

## 2024-03-17 NOTE — ED Provider Notes (Signed)
 [place] EMERGENCY DEPARTMENT AT [facility] Provider Note   CSN: [id number] Arrival date & time: 03/17/24  0930     Patient presents with: Insect Bite   Kelli Stout is a 58 y.o. female with noncontributory past medical history presents with complaints of bee sting to her right eye.  Patient reports that 10 years ago she had another bee sting with what sounded like a notable allergic reaction.  She states she was provided an epi pen but never required it. Patient is not complaining of any shortness of breath, difficulty swallowing, vomiting, vision changes.   HPI    Past Medical History:  Diagnosis Date   Anxiety    Chicken pox    Depression    HIV infection (HCC)    Hypertension    UTI (urinary tract infection)    Past Surgical History:  Procedure Laterality Date   ABDOMINAL HYSTERECTOMY  1989   partial   TOE SURGERY  09/01/2018   Left foot     Prior to Admission medications   Medication Sig Start Date End Date Taking? Authorizing Provider  EPINEPHrine 0.3 mg/0.3 mL IJ SOAJ injection Inject 0.3 mg into the muscle as needed for anaphylaxis. 03/17/24  Yes Donnajean Lynwood VEAR, PA-C  cefadroxil  (DURICEF) 500 MG capsule Take 1 capsule (500 mg total) by mouth 2 (two) times daily. 09/07/23   Prosperi, Christian H, PA-C  estrogens, conjugated, (PREMARIN) 0.3 MG tablet TAKE 1 TABLET BY MOUTH DAILY 05/24/19   [provider]  famotidine (PEPCID) 40 MG tablet Take 40 mg by mouth daily. 09/28/19   [provider]  LORazepam  (ATIVAN ) 0.5 MG tablet Take 1 tablet (0.5 mg total) by mouth every 6 (six) hours as needed for anxiety. 07/25/20   Burchette, Wolm ORN, MD  meloxicam  (MOBIC ) 7.5 MG tablet Take 1 tablet (7.5 mg total) by mouth 2 (two) times daily as needed for pain. 12/16/20   Jerri Kay HERO, MD  methocarbamol  (ROBAXIN ) 500 MG tablet Take 1 tablet (500 mg total) by mouth 2 (two) times daily. 09/07/23   Prosperi, Christian H, PA-C   neomycin -polymyxin-hydrocortisone (CORTISPORIN) 3.5-10000-1 OTIC suspension Apply 1-2 drops daily after soaking and cover with bandaid 03/15/24   McDonald, Juliene SAUNDERS, DPM  pantoprazole (PROTONIX) 40 MG tablet Take 40 mg by mouth daily. 09/28/19   [provider]  rilpivirine (ENDURANT) 25 MG TABS tablet Take 25 mg by mouth daily.    [provider]  solifenacin  (VESICARE ) 5 MG tablet Take 1 tablet (5 mg total) by mouth daily. 04/26/17   Burchette, Wolm ORN, MD  triamterene -hydrochlorothiazide (MAXZIDE-25) 37.5-25 MG tablet TAKE 1/2 TABLET BY MOUTH DAILY 09/23/20   Burchette, Wolm ORN, MD  TRUVADA 200-300 MG per tablet Take 1 tablet by mouth daily. Per Dr Koleen 05/18/12   [provider]  valACYclovir (VALTREX) 500 MG tablet Take 500 mg by mouth daily.    [provider]  venlafaxine XR (EFFEXOR-XR) 150 MG 24 hr capsule TAKE ONE CAPSULE BY MOUTH DAILY 03/08/13   Burchette, Wolm ORN, MD    Allergies: Patient has no known allergies.    Review of Systems  Respiratory:  Negative for shortness of breath.     Updated Vital Signs BP (!) 152/101 (BP Location: Right Wrist)   Pulse 93   Temp 98.1 F (36.7 C)   Resp 16   Ht 5' 8 (1.727 m)   Wt 117.9 kg   SpO2 100%   BMI 39.53 kg/m   Physical  Exam Vitals and nursing note reviewed.  Constitutional:      General: She is not in acute distress.    Appearance: She is well-developed.  HENT:     Head: Normocephalic and atraumatic.     Comments: No notable swelling or angioedema, airways patent  Eyes:     Conjunctiva/sclera: Conjunctivae normal.    Cardiovascular:     Rate and Rhythm: Normal rate and regular rhythm.     Heart sounds: No murmur heard. Pulmonary:     Effort: Pulmonary effort is normal. No respiratory distress.     Breath sounds: Normal breath sounds.   Musculoskeletal:        General: No swelling.     Cervical back: Neck supple.   Skin:    General: Skin is warm and dry.     Capillary Refill:  Capillary refill takes less than 2 seconds.   Neurological:     Mental Status: She is alert.   Psychiatric:        Mood and Affect: Mood normal.     (all labs ordered are listed, but only abnormal results are displayed) Labs Reviewed - No data to display  EKG: None  Radiology: No results found.   Procedures   Medications Ordered in the ED  diphenhydrAMINE  (BENADRYL ) capsule 25 mg (has no administration in time range)  ibuprofen (ADVIL) tablet 400 mg (400 mg Oral Given 03/17/24 0958)                                    Medical Decision Making Risk Prescription drug management.   This patient presents to the ED with chief complaint(s) of bee sting.  The complaint involves an extensive differential diagnosis and also carries with it a high risk of complications and morbidity.   Pertinent past medical history as listed in HPI  The differential diagnosis includes  Allergic reaction, anaphylaxis, ocular injury Additional history obtained: Records reviewed Care Everywhere/External Records  Assessment and management:   Hemodynamically stable, nontoxic-appearing patient presenting following bee sting to her right eyebrow.  Nearly 10 years ago she had an allergic reaction to a bee sting.  Does not sound that she had anaphylaxis.  Today she is without any swelling, no urticaria, stridor, angioedema.  She has no vision changes.  Overall she is very well-appearing.  Bee sting occurred 2 hours ago, at this time she is asymptomatic.  Will provide a dose of Benadryl  here in the ED.  Will send in refill for EpiPen and provide strict return precautions. Do not feel that she needs to be monitored for any longer.  Independent ECG interpretation:  none  Independent labs interpretation:  The following labs were independently interpreted:  none  Independent visualization and interpretation of imaging: I independently visualized the following imaging with scope of interpretation limited to  determining acute life threatening conditions related to emergency care: none    Consultations obtained:   none  Disposition:   Patient will be discharged home. The patient has been appropriately medically screened and/or stabilized in the ED. I have low suspicion for any other emergent medical condition which would require further screening, evaluation or treatment in the ED or require inpatient management. At time of discharge the patient is hemodynamically stable and in no acute distress. I have discussed work-up results and diagnosis with patient and answered all questions. Patient is agreeable with discharge plan. We discussed strict return precautions  for returning to the emergency department and they verbalized understanding.     Social Determinants of Health:   none  This note was dictated with voice recognition software.  Despite best efforts at proofreading, errors may have occurred which can change the documentation meaning.       Final diagnoses:  Bee sting, accidental or unintentional, initial encounter    ED Discharge Orders          Ordered    EPINEPHrine 0.3 mg/0.3 mL IJ SOAJ injection  As needed        03/17/24 1144               Donnajean Lynwood DEL, PA-C 03/17/24 1144    Emil Share, DO 03/17/24 1523

## 2024-03-17 NOTE — ED Triage Notes (Signed)
 Pt states she was stung by a bee approximately an hour ago on right eyebrow and is having swelling and pain around eye. Pt states it is causing her to have a headache.

## 2024-03-17 NOTE — Discharge Instructions (Signed)
 You were evaluated in the emergency room for bee sting.  Your exam is very reassuring.  I would recommend taking Benadryl  again this evening around dinnertime.  You may take it up to 4 times a day tomorrow if you have any further swelling.  Prescription for EpiPen was sent into your pharmacy.  If you experience any worsening symptoms despite taking Benadryl  please return to emergency room.

## 2024-04-13 NOTE — Unmapped (Signed)
 [facility] Refill Coordination Note    Specialty Medication(s) to be Shipped:   Infectious Disease: Genvoya     Other medication(s) to be shipped: Pantoprazole , amlodipine , valacyclovir , venlafaxine ,       Karen Strickland, DOB: 1966-07-03  Phone: [phone] (home)       All above HIPAA information was verified with patient.     Was a Nurse, learning disability used for this call? No    Completed refill call assessment today to schedule patient's medication shipment from the [facility]  [phone]).  All relevant notes have been reviewed.     Specialty medication(s) and dose(s) confirmed: Regimen is correct and unchanged.   Changes to medications: [name] reports no changes at this time.  Changes to insurance: No  New side effects reported not previously addressed with a pharmacist or physician: None reported  Questions for the pharmacist: No    Confirmed patient received a Conservation officer, historic buildings and a Surveyor, mining with first shipment. The patient will receive a drug information handout for each medication shipped and additional FDA Medication Guides as required.       DISEASE/MEDICATION-SPECIFIC INFORMATION        N/A    SPECIALTY MEDICATION ADHERENCE     Medication Adherence    Patient reported X missed doses in the last month: 0  Specialty Medication: GENVOYA  150-150-200-10 mg Tab tablet (elvitegravir-cobicistat-emtricitabine-tenofovir)  Patient is on additional specialty medications: No  Informant: patient              Were doses missed due to medication being on hold? No        GENVOYA  150-150-200-10 mg Tab tablet: 5  days of medicine on hand        REFERRAL TO PHARMACIST     Referral to the pharmacist: Not needed      [facility]     Shipping address confirmed in Epic.     Cost and Payment: Patient has a $0 copay, payment information is not required. Last  copay  was  0.00       Delivery Scheduled: Yes, Expected medication delivery date: 04/17/24 . Medication will be delivered via UPS to the prescription address in Epic WAM.    Karen Strickland [facility]

## 2024-04-16 MED FILL — VENLAFAXINE ER 150 MG CAPSULE,EXTENDED RELEASE 24 HR: ORAL | 90 days supply | Qty: 90 | Fill #2

## 2024-04-16 MED FILL — GENVOYA 150 MG-150 MG-200 MG-10 MG TABLET: ORAL | 90 days supply | Qty: 90 | Fill #1

## 2024-04-16 MED FILL — PANTOPRAZOLE 40 MG TABLET,DELAYED RELEASE: ORAL | 30 days supply | Qty: 30 | Fill #7

## 2024-04-16 MED FILL — AMLODIPINE 5 MG TABLET: ORAL | 90 days supply | Qty: 45 | Fill #2

## 2024-04-16 MED FILL — VALACYCLOVIR 1 GRAM TABLET: ORAL | 90 days supply | Qty: 90 | Fill #1

## 2024-05-03 DIAGNOSIS — Z0000 Encounter for general adult medical examination without abnormal findings: Principal | ICD-10-CM

## 2024-05-03 DIAGNOSIS — N951 Menopausal and female climacteric states: Principal | ICD-10-CM

## 2024-05-03 DIAGNOSIS — F32A Depression, unspecified depression type: Principal | ICD-10-CM

## 2024-05-03 MED ORDER — GABAPENTIN 100 MG CAPSULE
ORAL_CAPSULE | Freq: Every evening | ORAL | 3 refills | 30.00000 days | Status: CP | PRN
Start: 2024-05-03 — End: ?
  Filled 2024-05-03: qty 90, 30d supply, fill #0

## 2024-05-03 MED ORDER — VENLAFAXINE ER 75 MG CAPSULE,EXTENDED RELEASE 24 HR
ORAL_CAPSULE | Freq: Every day | ORAL | 3 refills | 90.00000 days | Status: CP
Start: 2024-05-03 — End: ?
  Filled 2024-05-03: qty 90, 90d supply, fill #0

## 2024-05-03 MED ORDER — ESTRADIOL 0.01% (0.1 MG/GRAM) VAGINAL CREAM
Freq: Every day | VAGINAL | 11 refills | 21.00000 days | Status: CP
Start: 2024-05-03 — End: 2025-05-03
  Filled 2024-05-03: qty 42.5, 21d supply, fill #0

## 2024-05-03 NOTE — Unmapped (Signed)
 [place] [facility] at [facility]     Reason for visit: Follow up    Questions / Concerns that need to be addressed: Hormone Replacement, question ear infection both ears    Screening BP- 131/87 79      HCDM reviewed and updated in Epic:    We are working to make sure all of our patients??? wishes are updated in Epic and part of that is documenting a Environmental health practitioner for each patient  A Health Care Decision Maker is someone you choose who can make health care decisions for you if you are not able - who would you most want to do this for you????  was updated.    HCDM (patient stated preference): Alston,Blanche - [phone]        Annual Screenings:   SDOH   __________________________________________________________________________________________    SCREENINGS COMPLETED IN FLOWSHEETS      AUDIT       PHQ2       PHQ9          GAD7       COPD Assessment       Falls Risk

## 2024-05-03 NOTE — Unmapped (Signed)
 Internal Medicine Clinic Visit    Reason for visit: Hot flashes     A/P:         1. Hot flashes due to menopause    2. Routine health maintenance    3. Depression, unspecified depression type          1. Hot flashes due to menopause  Patient cannot be clinically diagnosed with menopause as she had a hysterectomy in 1989. Considered testing FSH and LH but unlikely to change management so will hold off.  She is >10 years out from onset of her symptoms and given her history of HIV and HTN, estrogen therapy is contraindicated due to increased CVD risk. Discussed different treatment modalities with patient and she agreed that vaginal cream would be the best for dyspareunia and vaginal dryness. For hot flashes, we discussed that venlafaxine  at her old dose of 150 mg (for depression) can potentially cause hot flashes but a lower dose at 75 mg could be effective for symptom management. Instructed patient to switch to lower dose on day off from work as the dose reduction can cause dizziness. Prescribed 300 mg prn gabapentin  for symptom management at night.   - venlafaxine  (EFFEXOR -XR) 75 MG 24 hr capsule; Take 1 capsule (75 mg total) by mouth daily. Start taking lower dose on a day off from work  Dispense: 90 capsule; Refill: 3  - gabapentin  (NEURONTIN ) 100 MG capsule; Take 3 capsules (300 mg total) by mouth nightly as needed. Take 300 mg at night as needed for hot flashes  Dispense: 90 capsule; Refill: 3  - estradiol  (ESTRACE ) 0.01 % (0.1 mg/gram) vaginal cream; Insert 2 g into the vagina daily.  Dispense: 42.5 g; Refill: 11      2. Routine health maintenance   Ordered colonoscopy as patient is due 2025   - Colonoscopy; Future      Return in about 6 months (around 11/03/2024).    Staffed with Dr. Marvis, seen and discussed    __________________________________________________________    HPI: Karen Strickland is a 58 y/o woman with a pmh of HTN, HIV on Genvoya , depression who presents to discuss hot flashes.     Hot flashes:  Partial hysterectomy due to pelvic floor collapse in 1989, but has ovaries present and has 4 children   Hot flashes have been happening since 2012 and have been stable   They are particularly bad in winter due to heater   Having vaginal dryness and dyspareunia since 02/2024     Denies: sob, palpitations, weight loss, or changes in bowel habits             __________________________________________________________        Medications:  Reviewed in EPIC  __________________________________________________________    Physical Exam:   Vital Signs:  Vitals:    05/03/24 1546   BP: 131/87   BP Site: L Arm   BP Position: Sitting   BP Cuff Size: Large   Pulse: 79   Temp: 36.3 ??C (97.4 ??F)   TempSrc: Temporal   SpO2: 99%   Weight: (!) 118 kg (260 lb 3.2 oz)   Height: 172.7 cm (5' 8)          PTHomeBP    The patient???s Average Home Blood Pressure during the last two weeks is :   /   based on  readings            Gen: Well appearing, NAD  CV: RRR, no murmurs  Pulm: CTA bilaterally, no  crackles or wheezes  Abd: Soft, NTND, normal BS.   Ext: No edema      PHQ-9 Score:     GAD-7 Score:       Medication adherence and barriers to the treatment plan have been addressed. Opportunities to optimize healthy behaviors have been discussed. Patient / caregiver voiced understanding.    Karen Lie, MD   [facility] Internal Medicine, PGY-1

## 2024-05-03 NOTE — Unmapped (Addendum)
 1) start taking the 75 mg of Venlafaxine  on an off day from work as it may make you dizzy     2) Take gabapentin  300 mg at night flor hot flashes     3) Please call: Colonoscopy- [phone] to schedule.

## 2024-05-10 NOTE — Unmapped (Signed)
 Although Effexor  can be used to treat hot flashes, occasionally I have seen it cause them. Trial of lower dose to see if hot flashes are a medication side effect (rather than menopausal hot flashes). If no improvement, can go back to previous dose. Also starting gabapentin  and vaginal estrogen. I saw and evaluated the patient, participating in the key portions of the service.  I reviewed the resident???s note.  I agree with the resident???s findings and plan. Izetta Kays, MD

## 2024-05-10 NOTE — Unmapped (Signed)
 Addended by: Fariha Goto N on: 05/10/2024 03:49 PM     Modules accepted: Level of Service

## 2024-05-15 MED FILL — PANTOPRAZOLE 40 MG TABLET,DELAYED RELEASE: ORAL | 30 days supply | Qty: 30 | Fill #8

## 2024-07-02 NOTE — Unmapped (Signed)
 Sent MyChart message to patient to re-engage in care.     [facility] Care Plan: Out of Care Patient (>6 months)    Patient's last ID clinic appt: 12/29/2023    Goal: Patient will have an office visit within 6 months of the last office visit unless stated otherwise by their provider.     Contact Interventions may include:  Placing phone calls to the patient and/or listed contacts.  Sending the patient an Out of Care text message through the [employer] or [facility] cell phone (if texting agreement is signed).  Sending the patient an Out of Care MyChart/letter.  Contacting the patient's most recent pharmacies as needed for additional information such as contact information or refill history.  Contacting the patient's case manager/caregiver as needed for additional information, if assigned.  Researching other online resources as needed.     If the patient is reached, the [employer] will attempt to:  Update all contact information including primary and secondary phone numbers and emergency contact details and address. Confirm access to MyChart.  Complete the Out of Care Intervention questionnaire with the patient.  Explore and discuss any barriers to visit attendance the patient may be experiencing that result in missed appointments, no-shows, cancellations, or lack of a scheduled follow-up appointment. These may include communication barriers (language, phone access, texting, MyChart), transportation, financial concerns, time off work, mental health/substance use, and/or multiple medical appointments.  Make appropriate referrals and link the patient to clinic nurses, social workers, benefits counselors, or other support services in the [facility] ID clinic to address concerns or barriers to care.  Schedule a follow-up appointment at an appropriate interval based on a completed assessment.  Confirm the patient has access to ART medications until that appointment. If the patient is not currently taking ART, needs refills, or is having difficulty taking the medication, refer to the clinical staff for follow up via [employer].    Monitor attendance of the patient's scheduled appointment and provide reminder calls/texts/messages consistent with patient preference, if needed.  Continue follow-up if indicated.    Expected Outcome:  Patient will have an office visit within 6 months of the last office visit unless stated otherwise by their provider.    If the patient has an urgent medical problem send a chat message to the clinic nursing staff for immediate follow-up     **If the patient is not able to be located or retained in HIV care, a referral will be placed to the [facility] Counselor for further follow-up.    Duration of Service: 9 minutes    Jospeh Mangel  Linkage and [facility]

## 2024-07-05 NOTE — Unmapped (Signed)
 [facility] Refill Coordination Note    Specialty Medication(s) to be Shipped:   Infectious Disease: Genvoya     Other medication(s) to be shipped: amlodipine , valtrex     Specialty Medications not needed at this time: N/A     Karen Strickland, DOB: [birth year]  Phone: [phone] (home)       All above HIPAA information was verified with patient.     Was a Nurse, learning disability used for this call? No    Completed refill call assessment today to schedule patient's medication shipment from the [facility]  [phone]).  All relevant notes have been reviewed.     Specialty medication(s) and dose(s) confirmed: Regimen is correct and unchanged.   Changes to medications: [name] reports no changes at this time.  Changes to insurance: No  New side effects reported not previously addressed with a pharmacist or physician: None reported  Questions for the pharmacist: No    Confirmed patient received a Conservation officer, historic buildings and a Surveyor, mining with first shipment. The patient will receive a drug information handout for each medication shipped and additional FDA Medication Guides as required.       DISEASE/MEDICATION-SPECIFIC INFORMATION        N/A    SPECIALTY MEDICATION ADHERENCE     Medication Adherence    Patient reported X missed doses in the last month: 0  Specialty Medication: GENVOYA  150-150-200-10 mg Tab tablet (elvitegravir-cobicistat-emtricitabine-tenofovir)  Patient is on additional specialty medications: No              Were doses missed due to medication being on hold? No    GENVOYA  150-150-200-10 mg Tab tablet (elvitegravir-cobicistat-emtricitabine-tenofovir) : 5 days of medicine on hand     REFERRAL TO PHARMACIST     Referral to the pharmacist: Not needed      [facility]     Shipping address confirmed in Epic.     Cost and Payment: Patient has a copay of $15. They are aware and have authorized the pharmacy to charge the credit card on file.    Delivery Scheduled: Yes, Expected medication delivery date: 10/21.     Medication will be delivered via UPS to the prescription address in Epic WAM.    [name]   [facility]  Specialty Technician

## 2024-07-09 MED FILL — GENVOYA 150 MG-150 MG-200 MG-10 MG TABLET: ORAL | 90 days supply | Qty: 90 | Fill #2

## 2024-07-09 MED FILL — AMLODIPINE 5 MG TABLET: ORAL | 90 days supply | Qty: 45 | Fill #3

## 2024-07-09 MED FILL — VALACYCLOVIR 1 GRAM TABLET: ORAL | 90 days supply | Qty: 90 | Fill #2

## 2024-07-11 ENCOUNTER — Encounter: Admit: 2024-07-11 | Discharge: 2024-07-11 | Payer: PRIVATE HEALTH INSURANCE

## 2024-07-11 DIAGNOSIS — Z6841 Body Mass Index (BMI) 40.0 and over, adult: Principal | ICD-10-CM

## 2024-07-11 DIAGNOSIS — E876 Hypokalemia: Principal | ICD-10-CM

## 2024-07-11 DIAGNOSIS — I10 Essential (primary) hypertension: Principal | ICD-10-CM

## 2024-07-11 DIAGNOSIS — Z131 Encounter for screening for diabetes mellitus: Principal | ICD-10-CM

## 2024-07-11 LAB — COMPREHENSIVE METABOLIC PANEL
ALBUMIN: 4 g/dL (ref 3.4–5.0)
ALKALINE PHOSPHATASE: 85 U/L (ref 46–116)
ALT (SGPT): 32 U/L (ref 10–49)
ANION GAP: 14 mmol/L (ref 5–14)
AST (SGOT): 20 U/L (ref <=34)
BILIRUBIN TOTAL: 0.4 mg/dL (ref 0.3–1.2)
BLOOD UREA NITROGEN: 17 mg/dL (ref 9–23)
BUN / CREAT RATIO: 18
CALCIUM: 9.4 mg/dL (ref 8.7–10.4)
CHLORIDE: 102 mmol/L (ref 98–107)
CO2: 29.3 mmol/L (ref 20.0–31.0)
CREATININE: 0.97 mg/dL (ref 0.55–1.02)
EGFR CKD-EPI (2021) FEMALE: 67 mL/min/1.73m2 (ref >=60)
GLUCOSE RANDOM: 89 mg/dL (ref 70–179)
POTASSIUM: 3.4 mmol/L (ref 3.4–4.8)
PROTEIN TOTAL: 7.4 g/dL (ref 5.7–8.2)
SODIUM: 145 mmol/L (ref 135–145)

## 2024-07-11 LAB — LIPID PANEL
CHOLESTEROL: 237 mg/dL — ABNORMAL HIGH (ref <200)
HDL CHOLESTEROL: 86 mg/dL (ref >50)
LDL CHOLESTEROL CALCULATED: 132 mg/dL — ABNORMAL HIGH (ref <100)
NON-HDL CHOLESTEROL: 151 mg/dL — ABNORMAL HIGH (ref <130)
TRIGLYCERIDES: 112 mg/dL (ref <150)

## 2024-07-11 LAB — HEMOGLOBIN A1C
ESTIMATED AVERAGE GLUCOSE: 134 mg/dL
HEMOGLOBIN A1C: 6.3 % — ABNORMAL HIGH (ref 4.8–5.6)

## 2024-07-11 NOTE — Progress Notes (Addendum)
 Karen Strickland, DMSc, PA-C    Assessment/Plan:     Lab Results   Component Value Date    A1C 5.9 (H) 05/03/2023    A1C 5.8 (H) 10/30/2021    A1C 6.0 (H) 04/24/2021       1.  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  2.  Karen Strickland has a lot going on in her life including recently getting out of a toxic relationship, her brother recently passed away.  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  She reached out to a therapist recently but has not heard back, she may search for another therapist.  Follow-up 3 months.  3.  Hypertension-remains on amlodipine , diastolic blood pressure slightly elevated today, follow-up 3 months.  4.  Hypokalemia-CMP today.    5.  HM-CMP, A1c, nonfasting lipid today.  Plan to address colon cancer screening at a later visit when her mental state has improved.    **A1c rising to 6.3, nonfasting lipids elevated in comparison, plan to address these next visit.  Remainder of labs unremarkable including potassium.  Home sleep study reveals: Moderate obstructive sleep apnea with an overall AHI = 15.5.  These respiratory events were associated with oxygen desaturations to a low of85       RECOMMENDATIONS     The patient should be considered for treatment for apnea.  Treatment options include PAP therapy (an in lab full night PAP titration study is needed), oral appliance and oral surgery in selected cases.  Any nasal rhinitis should be aggressively treated.  Avoidance of supine sleep until such time as more definitive therapy is available can also reduce apnea severity.    Proceed with sleep titration study, patient notified by phone    F/u: 3 months    Subject/Objective:     Chief Complaint   Patient presents with    Follow-up     Needs a sleep study for work?     S: 58 y.o. year old female with PMHx significant for Past Medical History[1].     Problem List[2]    Presents for acute visit, recently had DOT physical that she failed for apparently multiple reasons including possible need for sleep apnea workup based on BMI and neck circumference.  She does not have any problems sleeping, she is not tired through the day, no issues with falling asleep during the day.  No report of snoring.       Your Medication List            Accurate as of July 11, 2024 11:25 AM. If you have any questions, ask your nurse or doctor.                STOP taking these medications      oxybutynin  10 MG 24 hr tablet  Commonly known as: DITROPAN -XL  Stopped by: Nelwyn Hamilton, PA            CONTINUE taking these medications      amlodipine  5 MG tablet  Commonly known as: NORVASC   Take 0.5 tablets (2.5 mg total) by mouth daily.     estradiol  0.01 % (0.1 mg/gram) vaginal cream  Commonly known as: ESTRACE   Insert 2 g into the vagina daily.     gabapentin  100 MG capsule  Commonly known as: NEURONTIN   Take 3  capsules (300 mg total) by mouth nightly as needed. Take 300 mg at night as needed for hot flashes     GENVOYA  150-150-200-10 mg Tab tablet  Generic drug: elvitegravir-cobicistat-emtricitabine-tenofovir  Take 1 tablet by mouth daily.     HEALTHYLAX 17 gram packet  Generic drug: polyethylene glycol  Take 17 g by mouth daily.     pantoprazole  40 MG tablet  Commonly known as: PROTONIX   Take 1 tablet (40 mg total) by mouth daily. Needs clinic visit for additional refills     simethicone  180 mg capsule  Commonly known as: GAS-X  Take 1 capsule (180 mg total) by mouth every six (6) hours as needed for flatulence.     valACYclovir  1000 MG tablet  Commonly known as: VALTREX   Take 1 tablet (1,000 mg total) by mouth daily.     venlafaxine  75 MG 24 hr capsule  Commonly known as: EFFEXOR -XR  Take 1 capsule (75 mg total) by mouth daily. Start taking lower dose on a day off from work              Allergies[3]     Medication adherence and barriers to the treatment plan have been addressed. Opportunities to optimize healthy behaviors have been discussed. Patient / caregiver voiced understanding.      ROS: negative for vision changes, CP, abdominal pain, bowel/bladder changes, bleeding, lower extremity edema    O:  Vital Signs:    Vitals:    07/11/24 1036   BP: 130/89   Pulse: 105   Temp: 36.3 ??C (97.4 ??F)   TempSrc: Temporal   SpO2: 97%   Weight: (!) 116.6 kg (257 lb)   Height: 170.2 cm (5' 7)       Physical Exam  General Appearance:    Alert, cooperative, no distress, appears stated age   Head:    Normocephalic, without obvious abnormality, atraumatic   Eyes:     Ears:     Throat:   Lips, mucosa, and tongue normal   Neck:   Supple, symmetrical, trachea midline,      No JVD bilaterally.   Lungs:     Clear to auscultation bilaterally, respirations unlabored   Chest Wall:    No tenderness or deformity    Heart:    Regular rate and rhythm, S1 and S2 normal, no murmur, rub    or gallop   Abdomen:     Soft, non-tender       Extremities:   Extremities normal, atraumatic, no cyanosis or edema   Psych :  No agitation, anxiety, or depressed mood.      Note - This record has been created using [employer]. Chart creation errors have been sought, but may not always have been located. Such creation errors do not reflect on the standard of medical care.                           [1]   Past Medical History:  Diagnosis Date    Human immunodeficiency virus (HIV) disease    (CMS-HCC) 11/17/2006    Qualifier: Diagnosis of   By: WATT, JOANNE    Other specified glaucoma     Urinary tract infection    [2]   Patient Active Problem List  Diagnosis    Human immunodeficiency virus (HIV) disease    (CMS-HCC)    Genital herpes    Essential hypertension, benign    Benign paroxysmal positional vertigo  Anxiety as acute reaction to exceptional stress    Acute medial meniscus tear of right knee   [3] No Known Allergies

## 2024-07-11 NOTE — Unmapped (Signed)
 [facility] to schedule home sleep study: [phone]

## 2024-07-11 NOTE — Unmapped (Signed)
 [facility] at [facility]     Reason for visit: Follow up    Questions / Concerns that need to be addressed: Needs a sleep study for work asap    Screening BP- 133/90    [employer] BPs (complete if screening BP has a systolic  > 130 or diastolic > 80)  BP#1 133/90   BP#2 131/90  BP#3 125/87    Average BP 130/89   105      PTHomeBP     HCDM reviewed and updated in Epic:    We are working to make sure all of our patients??? wishes are updated in Epic and part of that is documenting a Environmental health practitioner for each patient  A Health Care Decision Maker is someone you choose who can make health care decisions for you if you are not able - who would you most want to do this for you????  is already up to date.    HCDM (patient stated preference): Karen Strickland,Karen Strickland - [phone]    BPAs completed:      Annual Screenings:     __________________________________________________________________________________________    SCREENINGS COMPLETED IN FLOWSHEETS      AUDIT       PHQ2       PHQ9          GAD7       COPD Assessment       Falls Risk

## 2024-07-16 NOTE — Telephone Encounter (Signed)
 Attempted to call patient for patient's re-engagement in care. Person who answered the phone said Karen Strickland is not available at this time. [facility] provided the phone number of [phone] to be relayed to [facility].     [facility] Care Plan: Out of Care Patient (>6 months)    Patient's last ID clinic appt: 12/29/2023    Goal: Patient will have an office visit within 6 months of the last office visit unless stated otherwise by their provider.     Contact Interventions may include:  Placing phone calls to the patient and/or listed contacts.  Sending the patient an Out of Care text message through the [employer] App or [facility] cell phone (if texting agreement is signed).  Sending the patient an Out of Care MyChart/letter.  Contacting the patient's most recent pharmacies as needed for additional information such as contact information or refill history.  Contacting the patient's case manager/caregiver as needed for additional information, if assigned.  Researching other online resources as needed.     If the patient is reached, the [employer] will attempt to:  Update all contact information including primary and secondary phone numbers and emergency contact details and address. Confirm access to MyChart.  Complete the Out of Care Intervention questionnaire with the patient.  Explore and discuss any barriers to visit attendance the patient may be experiencing that result in missed appointments, no-shows, cancellations, or lack of a scheduled follow-up appointment. These may include communication barriers (language, phone access, texting, MyChart), transportation, financial concerns, time off work, mental health/substance use, and/or multiple medical appointments.  Make appropriate referrals and link the patient to clinic nurses, social workers, benefits counselors, or other support services in the [facility] ID clinic to address concerns or barriers to care.  Schedule a follow-up appointment at an appropriate interval based on a completed assessment.  Confirm the patient has access to ART medications until that appointment. If the patient is not currently taking ART, needs refills, or is having difficulty taking the medication, refer to the clinical staff for follow up via [employer].    Monitor attendance of the patient's scheduled appointment and provide reminder calls/texts/messages consistent with patient preference, if needed.  Continue follow-up if indicated.    Expected Outcome:  Patient will have an office visit within 6 months of the last office visit unless stated otherwise by their provider.    If the patient has an urgent medical problem send a chat message to the clinic nursing staff for immediate follow-up     **If the patient is not able to be located or retained in HIV care, a referral will be placed to the [facility] HIV [facility] Counselor for further follow-up.    Duration of Service: 9 minutes    Karen Strickland  Linkage and Firefighter Infectious Diseases-[place]

## 2024-07-31 ENCOUNTER — Inpatient Hospital Stay: Admit: 2024-07-31 | Discharge: 2024-08-02 | Payer: PRIVATE HEALTH INSURANCE

## 2024-07-31 NOTE — Telephone Encounter (Signed)
 Drafted and sent an out of care letter in efforts to get the patient back into care.     [facility] Care Plan: Out of Care Patient (>6 months)    Patient's last ID clinic appt: 12/29/2023    Goal: Patient will have an office visit within 6 months of the last office visit unless stated otherwise by their provider.     Contact Interventions may include:  Placing phone calls to the patient and/or listed contacts.  Sending the patient an Out of Care text message through the [employer] App or [facility] cell phone (if texting agreement is signed).  Sending the patient an Out of Care MyChart/letter.  Contacting the patient's most recent pharmacies as needed for additional information such as contact information or refill history.  Contacting the patient's case manager/caregiver as needed for additional information, if assigned.  Researching other online resources as needed.     If the patient is reached, the [employer] will attempt to:  Update all contact information including primary and secondary phone numbers and emergency contact details and address. Confirm access to MyChart.  Complete the Out of Care Intervention questionnaire with the patient.  Explore and discuss any barriers to visit attendance the patient may be experiencing that result in missed appointments, no-shows, cancellations, or lack of a scheduled follow-up appointment. These may include communication barriers (language, phone access, texting, MyChart), transportation, financial concerns, time off work, mental health/substance use, and/or multiple medical appointments.  Make appropriate referrals and link the patient to clinic nurses, social workers, benefits counselors, or other support services in the [facility] ID clinic to address concerns or barriers to care.  Schedule a follow-up appointment at an appropriate interval based on a completed assessment.  Confirm the patient has access to ART medications until that appointment. If the patient is not currently taking ART, needs refills, or is having difficulty taking the medication, refer to the clinical staff for follow up via [employer].    Monitor attendance of the patient's scheduled appointment and provide reminder calls/texts/messages consistent with patient preference, if needed.  Continue follow-up if indicated.    Expected Outcome:  Patient will have an office visit within 6 months of the last office visit unless stated otherwise by their provider.    If the patient has an urgent medical problem send a chat message to the clinic nursing staff for immediate follow-up     **If the patient is not able to be located or retained in HIV care, a referral will be placed to the [facility] HIV [facility] Counselor for further follow-up.    Duration of Service: 7 minutes    Marshall Kampf  Linkage and Firefighter Infectious Diseases-[place]

## 2024-08-09 NOTE — Addendum Note (Signed)
 Addended by: GLENDIA METRO B on: 08/09/2024 12:26 PM     Modules accepted: Orders

## 2024-08-22 NOTE — Telephone Encounter (Signed)
 Sent text message to patient to re-engage in care via [employer] texting app.     [facility] Care Plan: Out of Care Patient (>6 months)    Patient's last ID clinic appt: 12/29/2023    Goal: Patient will have an office visit within 6 months of the last office visit unless stated otherwise by their provider.     Contact Interventions may include:  Placing phone calls to the patient and/or listed contacts.  Sending the patient an Out of Care text message through the [employer] App or [facility] cell phone (if texting agreement is signed).  Sending the patient an Out of Care MyChart/letter.  Contacting the patient's most recent pharmacies as needed for additional information such as contact information or refill history.  Contacting the patient's case manager/caregiver as needed for additional information, if assigned.  Researching other online resources as needed.     If the patient is reached, the [employer] will attempt to:  Update all contact information including primary and secondary phone numbers and emergency contact details and address. Confirm access to MyChart.  Complete the Out of Care Intervention questionnaire with the patient.  Explore and discuss any barriers to visit attendance the patient may be experiencing that result in missed appointments, no-shows, cancellations, or lack of a scheduled follow-up appointment. These may include communication barriers (language, phone access, texting, MyChart), transportation, financial concerns, time off work, mental health/substance use, and/or multiple medical appointments.  Make appropriate referrals and link the patient to clinic nurses, social workers, benefits counselors, or other support services in the [facility] ID clinic to address concerns or barriers to care.  Schedule a follow-up appointment at an appropriate interval based on a completed assessment.  Confirm the patient has access to ART medications until that appointment. If the patient is not currently taking ART, needs refills, or is having difficulty taking the medication, refer to the clinical staff for follow up via [employer].    Monitor attendance of the patient's scheduled appointment and provide reminder calls/texts/messages consistent with patient preference, if needed.  Continue follow-up if indicated.    Expected Outcome:  Patient will have an office visit within 6 months of the last office visit unless stated otherwise by their provider.    If the patient has an urgent medical problem send a chat message to the clinic nursing staff for immediate follow-up     **If the patient is not able to be located or retained in HIV care, a referral will be placed to the [facility] HIV [facility] Counselor for further follow-up.    Duration of Service: 7 minutes    Karen Strickland  Linkage and Firefighter Infectious Diseases[facility]

## 2024-08-30 MED FILL — VENLAFAXINE ER 75 MG CAPSULE,EXTENDED RELEASE 24 HR: ORAL | 90 days supply | Qty: 90 | Fill #0

## 2024-09-05 NOTE — Telephone Encounter (Signed)
 Attempted to call patient for patient's re-engagement in care. Left a discreet 'appointment needed' message in voicemail box.    [facility] Care Plan: Out of Care Patient (>6 months)    Patient's last ID clinic appt: 12/29/2023    Goal: Patient will have an office visit within 6 months of the last office visit unless stated otherwise by their provider.     Contact Interventions may include:  Placing phone calls to the patient and/or listed contacts.  Sending the patient an Out of Care text message through the [employer] App or [facility] cell phone (if texting agreement is signed).  Sending the patient an Out of Care MyChart/letter.  Contacting the patient's most recent pharmacies as needed for additional information such as contact information or refill history.  Contacting the patient's case manager/caregiver as needed for additional information, if assigned.  Researching other online resources as needed.     If the patient is reached, the [employer] will attempt to:  Update all contact information including primary and secondary phone numbers and emergency contact details and address. Confirm access to MyChart.  Complete the Out of Care Intervention questionnaire with the patient.  Explore and discuss any barriers to visit attendance the patient may be experiencing that result in missed appointments, no-shows, cancellations, or lack of a scheduled follow-up appointment. These may include communication barriers (language, phone access, texting, MyChart), transportation, financial concerns, time off work, mental health/substance use, and/or multiple medical appointments.  Make appropriate referrals and link the patient to clinic nurses, social workers, benefits counselors, or other support services in the [facility] ID clinic to address concerns or barriers to care.  Schedule a follow-up appointment at an appropriate interval based on a completed assessment.  Confirm the patient has access to ART medications until that appointment. If the patient is not currently taking ART, needs refills, or is having difficulty taking the medication, refer to the clinical staff for follow up via [employer].    Monitor attendance of the patient's scheduled appointment and provide reminder calls/texts/messages consistent with patient preference, if needed.  Continue follow-up if indicated.    Expected Outcome:  Patient will have an office visit within 6 months of the last office visit unless stated otherwise by their provider.    If the patient has an urgent medical problem send a chat message to the clinic nursing staff for immediate follow-up     **If the patient is not able to be located or retained in HIV care, a referral will be placed to the [facility] Counselor for further follow-up.    Duration of Service: 9 minutes    Jazier Mcglamery  Linkage and Firefighter Infectious Diseases[facility]

## 2024-09-21 NOTE — Telephone Encounter (Signed)
 Sent MyChart message to patient to re-engage in care.     [facility] Care Plan: Out of Care Patient (>6 months)    Patient's last ID clinic appt: 12/29/2023    Goal: Patient will have an office visit within 6 months of the last office visit unless stated otherwise by their provider.     Contact Interventions may include:  Placing phone calls to the patient and/or listed contacts.  Sending the patient an Out of Care text message through the [employer] App or [facility] cell phone (if texting agreement is signed).  Sending the patient an Out of Care MyChart/letter.  Contacting the patient's most recent pharmacies as needed for additional information such as contact information or refill history.  Contacting the patient's case manager/caregiver as needed for additional information, if assigned.  Researching other online resources as needed.     If the patient is reached, the [employer] will attempt to:  Update all contact information including primary and secondary phone numbers and emergency contact details and address. Confirm access to MyChart.  Complete the Out of Care Intervention questionnaire with the patient.  Explore and discuss any barriers to visit attendance the patient may be experiencing that result in missed appointments, no-shows, cancellations, or lack of a scheduled follow-up appointment. These may include communication barriers (language, phone access, texting, MyChart), transportation, financial concerns, time off work, mental health/substance use, and/or multiple medical appointments.  Make appropriate referrals and link the patient to clinic nurses, social workers, benefits counselors, or other support services in the [facility] ID clinic to address concerns or barriers to care.  Schedule a follow-up appointment at an appropriate interval based on a completed assessment.  Confirm the patient has access to ART medications until that appointment. If the patient is not currently taking ART, needs refills, or is having difficulty taking the medication, refer to the clinical staff for follow up via [employer].    Monitor attendance of the patient's scheduled appointment and provide reminder calls/texts/messages consistent with patient preference, if needed.  Continue follow-up if indicated.    Expected Outcome:  Patient will have an office visit within 6 months of the last office visit unless stated otherwise by their provider.    If the patient has an urgent medical problem send a chat message to the clinic nursing staff for immediate follow-up     **If the patient is not able to be located or retained in HIV care, a referral will be placed to the [facility] Counselor for further follow-up.    Duration of Service: 7 minutes    Nellie Pester  Linkage and Firefighter [facility]

## 2024-09-25 ENCOUNTER — Ambulatory Visit
Admission: EM | Admit: 2024-09-25 | Discharge: 2024-09-25 | Disposition: A | Discharge status: Home / Self Care | Attending: Family Medicine | Admitting: Family Medicine

## 2024-09-25 DIAGNOSIS — S39012A Strain of muscle, fascia and tendon of lower back, initial encounter: Secondary | ICD-10-CM | POA: Diagnosis not present

## 2024-09-25 MED ORDER — KETOROLAC TROMETHAMINE 30 MG/ML IJ SOLN
30.0000 mg | Freq: Once | INTRAMUSCULAR | Status: AC
  Administered 2024-09-25: 30 mg via INTRAMUSCULAR

## 2024-09-25 MED ORDER — METHOCARBAMOL 750 MG PO TABS: 750.0000 mg | ORAL_TABLET | Freq: Two times a day (BID) | ORAL | 0 refills | Status: AC | PRN

## 2024-09-25 NOTE — ED Provider Notes (Signed)
 " [facility]    CSN: [id number] Arrival date & time: 09/25/24  1303      History   Chief Complaint Chief Complaint  Patient presents with   Back Pain    HPI Kelli Stout is a 59 y.o. female presents for back pain.  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  Denies any injury such as fall.  States pain has been persistent since that time.  No numbness tingling or weakness of her lower extremity, no bowel or bladder incontinence, no saddle paresthesia.  No history of back injuries or surgeries.  She has been doing Goody powder for symptoms.  She also states she has an appointment with orthopedics next week.  She does not want to file a Worker's Comp. claim.  No other concerns at this time.   Back Pain   Past Medical History:  Diagnosis Date   Anxiety    Chicken pox    Depression    HIV infection (HCC)    Hypertension    UTI (urinary tract infection)     Patient Active Problem List   Diagnosis Date Noted   Acute medial meniscus tear of right knee 01/07/2021   Urinary urgency 05/11/2016   BPPV (benign paroxysmal positional vertigo) 06/08/2015   Lumbar radiculopathy 07/13/2013   HIV 11/17/2006   OBESITY, NOS 11/17/2006   Recurrent depression (HCC) 11/17/2006   HYPERTENSION, BENIGN SYSTEMIC 11/17/2006    Past Surgical History:  Procedure Laterality Date   ABDOMINAL HYSTERECTOMY  1989   partial   TOE SURGERY  09/01/2018   Left foot    OB History   No obstetric history on file.      Home Medications    Prior to Admission medications  Medication Sig Start Date End Date Taking? Authorizing Provider  methocarbamol  (ROBAXIN ) 750 MG tablet Take 1 tablet (750 mg total) by mouth 2 (two) times daily as needed for muscle spasms. 09/25/24  Yes Vannia Pola, Jodi R, NP  cefadroxil  (DURICEF) 500 MG capsule Take 1 capsule (500 mg total) by  mouth 2 (two) times daily. 09/07/23   Prosperi, Christian H, PA-C  EPINEPHrine  0.3 mg/0.3 mL IJ SOAJ injection Inject 0.3 mg into the muscle as needed for anaphylaxis. 03/17/24   Donnajean Lynwood VEAR, PA-C  estrogens, conjugated, (PREMARIN) 0.3 MG tablet TAKE 1 TABLET BY MOUTH DAILY 05/24/19   [provider]  famotidine (PEPCID) 40 MG tablet Take 40 mg by mouth daily. 09/28/19   [provider]  LORazepam  (ATIVAN ) 0.5 MG tablet Take 1 tablet (0.5 mg total) by mouth every 6 (six) hours as needed for anxiety. 07/25/20   Burchette, Wolm ORN, MD  meloxicam  (MOBIC ) 7.5 MG tablet Take 1 tablet (7.5 mg total) by mouth 2 (two) times daily as needed for pain. 12/16/20   Jerri Kay HERO, MD  neomycin -polymyxin-hydrocortisone (CORTISPORIN) 3.5-10000-1 OTIC suspension Apply 1-2 drops daily after soaking and cover with bandaid 03/15/24   McDonald, Juliene SAUNDERS, DPM  pantoprazole (PROTONIX) 40 MG tablet Take 40 mg by mouth daily. 09/28/19   [provider]  rilpivirine (ENDURANT) 25 MG TABS tablet Take 25 mg by mouth daily.    [provider]  solifenacin  (VESICARE ) 5 MG tablet Take 1 tablet (5 mg total) by mouth daily. 04/26/17   Burchette, Wolm ORN, MD  triamterene -hydrochlorothiazide (MAXZIDE-25) 37.5-25 MG tablet TAKE 1/2 TABLET BY MOUTH  DAILY 09/23/20   Burchette, Wolm ORN, MD  TRUVADA 200-300 MG per tablet Take 1 tablet by mouth daily. Per Dr Koleen 05/18/12   [provider]  valACYclovir (VALTREX) 500 MG tablet Take 500 mg by mouth daily.    [provider]  venlafaxine XR (EFFEXOR-XR) 150 MG 24 hr capsule TAKE ONE CAPSULE BY MOUTH DAILY 03/08/13   Burchette, Wolm ORN, MD    Family History Family History  Problem Relation Age of Onset   Hyperlipidemia Mother    Heart disease Mother    Hypertension Mother    Mental illness Mother    Hypertension Father    Diabetes Father        type ll   Colon cancer Maternal Aunt 66   Heart disease Maternal Uncle    Arthritis Maternal  Grandmother    Cancer Maternal Grandmother        bladder   Cancer Son 26       osteosarcoma    Social History Social History[1]   Allergies   Patient has no known allergies.   Review of Systems Review of Systems  Musculoskeletal:  Positive for back pain.     Physical Exam Triage Vital Signs ED Triage Vitals  Encounter Vitals Group     BP 09/25/24 1439 138/84     Girls Systolic BP Percentile --      Girls Diastolic BP Percentile --      Boys Systolic BP Percentile --      Boys Diastolic BP Percentile --      Pulse Rate 09/25/24 1439 79     Resp 09/25/24 1439 16     Temp 09/25/24 1439 97.9 F (36.6 C)     Temp src --      SpO2 09/25/24 1439 99 %     Weight --      Height --      Head Circumference --      Peak Flow --      Pain Score 09/25/24 1438 6     Pain Loc --      Pain Education --      Exclude from Growth Chart --    No data found.  Updated Vital Signs BP 138/84 (BP Location: Right Arm)   Pulse 79   Temp 97.9 F (36.6 C)   Resp 16   SpO2 99%   Visual Acuity Right Eye Distance:   Left Eye Distance:   Bilateral Distance:    Right Eye Near:   Left Eye Near:    Bilateral Near:     Physical Exam Vitals and nursing note reviewed.  Constitutional:      General: She is not in acute distress.    Appearance: Normal appearance. She is not ill-appearing.  HENT:     Head: Normocephalic and atraumatic.  Eyes:     Pupils: Pupils are equal, round, and reactive to light.  Cardiovascular:     Rate and Rhythm: Normal rate.  Pulmonary:     Effort: Pulmonary effort is normal.  Musculoskeletal:     Thoracic back: Normal.     Lumbar back: Tenderness present. No swelling, edema, deformity, signs of trauma, lacerations, spasms or bony tenderness. Positive right straight leg raise test. Negative left straight leg raise test. No scoliosis.       Back:     Comments: Strength 5 out of 5 bilateral lower extremities  Skin:    General: Skin is warm and dry.   Neurological:  General: No focal deficit present.     Mental Status: She is alert and oriented to person, place, and time.  Psychiatric:        Mood and Affect: Mood normal.        Behavior: Behavior normal.      UC Treatments / Results  Labs (all labs ordered are listed, but only abnormal results are displayed) Labs Reviewed - No data to display  Comprehensive Metabolic Panel Order: [id number] Component Ref Range & Units 2 mo ago  Sodium 135 - 145 mmol/L 145  Potassium 3.4 - 4.8 mmol/L 3.4  Chloride 98 - 107 mmol/L 102  CO2 20.0 - 31.0 mmol/L 29.3  Anion Gap 5 - 14 mmol/L 14  BUN 9 - 23 mg/dL 17  Creatinine 9.44 - 8.97 mg/dL 9.02  BUN/Creatinine Ratio 18  eGFR CKD-EPI (2021) Female >=60 mL/min/1.76m2 67  Comment: eGFR calculated with CKD-EPI 2021 equation in accordance with [employer] and [employer] of [employer]ph[employer] Task Force recommendations.  Glucose 70 - 179 mg/dL 89  Calcium 8.7 - 89.5 mg/dL 9.4  Albumin 3.4 - 5.0 g/dL 4.0  Total Protein 5.7 - 8.2 g/dL 7.4  Total Bilirubin 0.3 - 1.2 mg/dL 0.4  AST <=65 U/L 20  ALT 10 - 49 U/L 32  Alkaline Phosphatase 46 - 116 U/L 85  Resulting Agency [facility]   Specimen Collected: 07/11/24 11:26   Performed by: [facility] Last Resulted: 07/11/24 12:25  Received From: [facility]  Result Received: 09/25/24 10:24   EKG   Radiology No results found.  Procedures Procedures (including critical care time)  Medications Ordered in UC Medications  ketorolac  (TORADOL ) 30 MG/ML injection 30 mg (has no administration in time range)    Initial Impression / Assessment and Plan / UC Course  I have reviewed the triage vital signs and the nursing notes.  Pertinent labs & imaging results that were available during my care of the patient were reviewed by me and considered in my medical decision making (see chart for details).     Reviewed exam and symptoms with  patient.  No red flags.  Discussed low back strain.  Patient given Toradol  injection in clinic.  Monitored for 10 minutes after injection with no reaction noted and tolerated well.  Instructed no NSAIDs for 24 hours and patient verbalized understanding.  Will do trial of Robaxin , side effect profile reviewed.  Advised heat and rest and follow-up with orthopedic at her scheduled appointment next week.  ER precautions reviewed. Final Clinical Impressions(s) / UC Diagnoses   Final diagnoses:  Strain of lumbar region, initial encounter     Discharge Instructions      You were given a Toradol  injection in clinic today. Do not take any over the counter NSAID's such as Advil , ibuprofen , Aleve, or naproxen for 24 hours. You may take tylenol if needed. You can start Robaxin  muscle relaxer twice daily as needed.  Please note this medication can make you drowsy.  Do not drink alcohol or drive while on this medication.  Heat to the low back and rest.  Please follow-up with your orthopedic at your scheduled appointment next week.  Please go to the ER if you develop any worsening symptoms.  Hope you feel better soon!      ED Prescriptions     Medication Sig Dispense Auth. Provider   methocarbamol  (ROBAXIN ) 750 MG tablet Take 1 tablet (750 mg total) by mouth 2 (two) times daily as needed  for muscle spasms. 10 tablet Santresa Levett, Jodi R, NP      PDMP not reviewed this encounter.     [1]  Social History Tobacco Use   Smoking status: Never   Smokeless tobacco: Never  Vaping Use   Vaping status: Never Used  Substance Use Topics   Alcohol use: No    Alcohol/week: 0.0 standard drinks of alcohol   Drug use: No     Loreda Myla SAUNDERS, NP 09/25/24 1507  "

## 2024-09-25 NOTE — ED Triage Notes (Addendum)
 Pt present with c/o mid back pain radiating to the lower back x 7 days. Pt states she has pain when sitting and standing for a while. Pt states when she leans over she feels a stretch.

## 2024-09-25 NOTE — Discharge Instructions (Addendum)
 You were given a Toradol  injection in clinic today. Do not take any over the counter NSAID's such as Advil , ibuprofen , Aleve, or naproxen for 24 hours. You may take tylenol if needed. You can start Robaxin  muscle relaxer twice daily as needed.  Please note this medication can make you drowsy.  Do not drink alcohol or drive while on this medication.  Heat to the low back and rest.  Please follow-up with your orthopedic at your scheduled appointment next week.  Please go to the ER if you develop any worsening symptoms.  Hope you feel better soon!

## 2024-10-03 ENCOUNTER — Ambulatory Visit: Admitting: Physician Assistant

## 2024-10-03 ENCOUNTER — Other Ambulatory Visit (INDEPENDENT_AMBULATORY_CARE_PROVIDER_SITE_OTHER)

## 2024-10-03 DIAGNOSIS — G8929 Other chronic pain: Secondary | ICD-10-CM

## 2024-10-03 DIAGNOSIS — M25562 Pain in left knee: Secondary | ICD-10-CM | POA: Diagnosis not present

## 2024-10-03 DIAGNOSIS — M5442 Lumbago with sciatica, left side: Secondary | ICD-10-CM

## 2024-10-03 MED ORDER — METHYLPREDNISOLONE ACETATE 40 MG/ML IJ SUSP
40.0000 mg | INTRAMUSCULAR | Status: AC | PRN
  Administered 2024-10-03: 40 mg via INTRA_ARTICULAR

## 2024-10-03 MED ORDER — BUPIVACAINE HCL 0.25 % IJ SOLN
2.0000 mL | INTRAMUSCULAR | Status: AC | PRN
  Administered 2024-10-03: 2 mL via INTRA_ARTICULAR

## 2024-10-03 MED ORDER — LIDOCAINE HCL 1 % IJ SOLN
2.0000 mL | INTRAMUSCULAR | Status: AC | PRN
  Administered 2024-10-03: 2 mL

## 2024-10-03 MED ORDER — PREDNISONE 10 MG (21) PO TBPK: ORAL_TABLET | ORAL | 0 refills | Status: AC

## 2024-10-03 NOTE — Telephone Encounter (Signed)
 Sent text message to patient to re-engage in care via [employer] texting app.     [facility] Care Plan: Out of Care Patient (>6 months)    Patient's last ID clinic appt: 12/29/2023    Goal: Patient will have an office visit within 6 months of the last office visit unless stated otherwise by their provider.     Contact Interventions may include:  Placing phone calls to the patient and/or listed contacts.  Sending the patient an Out of Care text message through the [employer] App or [facility] cell phone (if texting agreement is signed).  Sending the patient an Out of Care MyChart/letter.  Contacting the patient's most recent pharmacies as needed for additional information such as contact information or refill history.  Contacting the patient's case manager/caregiver as needed for additional information, if assigned.  Researching other online resources as needed.     If the patient is reached, the [employer] will attempt to:  Update all contact information including primary and secondary phone numbers and emergency contact details and address. Confirm access to MyChart.  Complete the Out of Care Intervention questionnaire with the patient.  Explore and discuss any barriers to visit attendance the patient may be experiencing that result in missed appointments, no-shows, cancellations, or lack of a scheduled follow-up appointment. These may include communication barriers (language, phone access, texting, MyChart), transportation, financial concerns, time off work, mental health/substance use, and/or multiple medical appointments.  Make appropriate referrals and link the patient to clinic nurses, social workers, benefits counselors, or other support services in the [facility] ID clinic to address concerns or barriers to care.  Schedule a follow-up appointment at an appropriate interval based on a completed assessment.  Confirm the patient has access to ART medications until that appointment. If the patient is not currently taking ART, needs refills, or is having difficulty taking the medication, refer to the clinical staff for follow up via [employer].    Monitor attendance of the patient's scheduled appointment and provide reminder calls/texts/messages consistent with patient preference, if needed.  Continue follow-up if indicated.    Expected Outcome:  Patient will have an office visit within 6 months of the last office visit unless stated otherwise by their provider.    If the patient has an urgent medical problem send a chat message to the clinic nursing staff for immediate follow-up     **If the patient is not able to be located or retained in HIV care, a referral will be placed to the [facility] Counselor for further follow-up.    Duration of Service: 7 minutes    Yunus Stoklosa  Linkage and Firefighter [facility]

## 2024-10-03 NOTE — Progress Notes (Signed)
 "  Office Visit Note   Patient: Kelli Stout           Date of Birth: 14-Mar-1966           MRN: [id number] Visit Date: 10/03/2024              Requested by: Glendia Nelwyn NOVAK, PA [address] [place],  [place] [address] PCP: Glendia Nelwyn NOVAK, [place]   Assessment & Plan: Visit Diagnoses:  1. Chronic pain of left knee   2. Chronic midline low back pain with left-sided sciatica     Plan: Impression is low back pain with left lower extremity radiculopathy in addition to left knee arthritis flare.  In regards to her lumbar spine, we have discussed starting her on a steroid pack and sending her to outpatient physical therapy.  If her symptoms do not improve over the next 6 to 8 weeks, she will follow-up with Megan Williams for further evaluation and treatment recommendation.  In regards to the left knee, we have discussed proceeding with intra-articular cortisone injection.  She has had these in the past with good relief.  She will follow-up with us  as needed.  Follow-Up Instructions: No follow-ups on file.   Orders:  Orders Placed This Encounter  Procedures   Large Joint Inj: L knee   XR KNEE 3 VIEW LEFT   XR Lumbar Spine 2-3 Views   Ambulatory referral to Physical Therapy   Meds ordered this encounter  Medications   predniSONE  (STERAPRED UNI-PAK 21 TAB) 10 MG (21) TBPK tablet    Sig: Take as directed    Dispense:  21 tablet    Refill:  0      Procedures: Large Joint Inj: L knee on 10/03/2024 9:49 AM Medications: 2 mL lidocaine  1 %; 2 mL bupivacaine  0.25 %; 40 mg methylPREDNISolone  acetate 40 MG/ML      Clinical Data: No additional findings.   Subjective: Chief Complaint  Patient presents with   Middle Back - Pain   Lower Back - Pain    HPI patient is a pleasant 59 year old female who comes in today with back pain as well as left knee pain.  She notes that her back pain started on 09/18/2024.  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  Soon after, she started having pain to her knee.  The pain she is now having starts in the middle of her low back radiates down the left buttock and down the back of the leg and wraps around the left knee.  No pain distal to the knee.  No weakness or paresthesias to the left lower extremity.  She describes her pain as a constant ache worse with standing as well as with too much activity.  She takes Advil  as well as occasional Robaxin  at night with some relief.  She denies any bowel or bladder change or saddle paresthesias.  She does have a history of back pain but no interventions.  She does have a history of left knee pain and has undergone cortisone injection approximately 6 months ago with good relief.  Review of Systems as detailed in HPI.  All others reviewed and are negative.   Objective: Vital Signs: There were no vitals taken for this visit.  Physical Exam well-developed well-nourished female in no acute distress.  Alert and oriented x 3.  Ortho Exam lumbar spine  exam: No spinous or paraspinous tenderness.  No pain with lumbar flexion.  She has mild discomfort and tightness with lumbar extension.  Negative straight leg raise on the left.  No focal weakness.  Left knee exam: No effusion.  Range of motion 0 to 120 degrees.  She does have mild tenderness to the medial joint line.  She is neurovascular intact distally.  Specialty Comments:  No specialty comments available.  Imaging: XR KNEE 3 VIEW LEFT Result Date: 10/03/2024 Moderate tricompartmental degenerative changes worse in the medial compartment  XR Lumbar Spine 2-3 Views Result Date: 10/03/2024 Advanced multilevel degenerative changes worse L4-5 and L5-S1    PMFS History: Patient Active Problem List   Diagnosis Date Noted   Acute medial meniscus tear of right knee 01/07/2021   Urinary urgency 05/11/2016   BPPV (benign paroxysmal  positional vertigo) 06/08/2015   Lumbar radiculopathy 07/13/2013   HIV 11/17/2006   OBESITY, NOS 11/17/2006   Recurrent depression (HCC) 11/17/2006   HYPERTENSION, BENIGN SYSTEMIC 11/17/2006   Past Medical History:  Diagnosis Date   Anxiety    Chicken pox    Depression    HIV infection (HCC)    Hypertension    UTI (urinary tract infection)     Family History  Problem Relation Age of Onset   Hyperlipidemia Mother    Heart disease Mother    Hypertension Mother    Mental illness Mother    Hypertension Father    Diabetes Father        type ll   Colon cancer Maternal Aunt 74   Heart disease Maternal Uncle    Arthritis Maternal Grandmother    Cancer Maternal Grandmother        bladder   Cancer Son 48       osteosarcoma    Past Surgical History:  Procedure Laterality Date   ABDOMINAL HYSTERECTOMY  1989   partial   TOE SURGERY  09/01/2018   Left foot   Social History   Occupational History   Not on file  Tobacco Use   Smoking status: Never   Smokeless tobacco: Never  Vaping Use   Vaping status: Never Used  Substance and Sexual Activity   Alcohol use: No    Alcohol/week: 0.0 standard drinks of alcohol   Drug use: No   Sexual activity: Not on file        "

## 2024-10-07 NOTE — Therapy (Signed)
 " OUTPATIENT PHYSICAL THERAPY THORACOLUMBAR EVALUATION   Patient Name: Kelli Stout MRN: [id number] DOB:09/25/1965, 59 y.o., female Today's Date: 10/08/2024  END OF SESSION:  PT End of Session - 10/08/24 0935     Visit Number 1    Number of Visits 17    Date for Recertification  12/03/24    PT Start Time 0845    PT Stop Time 0925    PT Time Calculation (min) 40 min    Activity Tolerance Patient tolerated treatment well    Behavior During Therapy [facility] for tasks assessed/performed          Past Medical History:  Diagnosis Date   Anxiety    Chicken pox    Depression    HIV infection (HCC)    Hypertension    UTI (urinary tract infection)    Past Surgical History:  Procedure Laterality Date   ABDOMINAL HYSTERECTOMY  1989   partial   TOE SURGERY  09/01/2018   Left foot   Patient Active Problem List   Diagnosis Date Noted   Acute medial meniscus tear of right knee 01/07/2021   Urinary urgency 05/11/2016   BPPV (benign paroxysmal positional vertigo) 06/08/2015   Lumbar radiculopathy 07/13/2013   HIV 11/17/2006   OBESITY, NOS 11/17/2006   Recurrent depression (HCC) 11/17/2006   HYPERTENSION, BENIGN SYSTEMIC 11/17/2006    PCP: Glendia Nelwyn NOVAK, PA   REFERRING PROVIDER: Jule Ronal CROME, PA-C   REFERRING DIAG:  Diagnosis  M54.42,G89.29 (ICD-10-CM) - Chronic midline low back pain with left-sided sciatica    Rationale for Evaluation and Treatment: Rehabilitation  THERAPY DIAG:  Other low back pain  Muscle weakness (generalized)  ONSET DATE: 09/18/24  SUBJECTIVE:                                                                                                                                                                                           SUBJECTIVE STATEMENT: Pt states on 09/18/24 she works at [employer] and pushed a lot of wheelchairs.  She woke up next day with LBP.  Had some radiating pain but that has resolved.  She is out of work  for now.    PERTINENT HISTORY:  No spinal surgeries  PAIN:  Are you having pain? Yes: NPRS scale: 3/10 Pain location: Middle lumbar  Pain description: dull, tight Aggravating factors: pushing wheelchairs, bending over,standing Relieving factors: heat, medication  PRECAUTIONS: None  RED FLAGS: Bowel or bladder incontinence: No   WEIGHT BEARING RESTRICTIONS: No  FALLS:  Has patient fallen in last 6 months? No  LIVING ENVIRONMENT: Lives with: lives alone Lives in: House/apartment Stairs: No  Has following equipment at home: None  OCCUPATION: works at [employer]  PLOF: Independent  PATIENT GOALS: Decrease pain and return to work  NEXT MD VISIT: not listed  OBJECTIVE:  Note: Objective measures were completed at Evaluation unless otherwise noted.  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     4.      Total Score 6      Total Score = Sum of activity scores/number of activities  Minimally Detectable Change: 3 points (for single activity); 2 points (for average score)  [facility] (nd). The Patient Specific Functional Scale . Retrieved from [email or web address].com.[email or web address]   COGNITION: Overall cognitive status: Within functional limits for tasks assessed     SENSATION: WFL  MUSCLE LENGTH: Hamstrings: Right and Left moderate restriction   POSTURE: L knee hyperextended  PALPATION: 1+ at lumbar pvm  LUMBAR ROM:   AROM eval  Flexion 75%  Extension 75%  Right lateral flexion WNL  Left lateral flexion WNL  Right rotation 50%  Left rotation  50%   (Blank rows = not tested)  LOWER EXTREMITY ROM:   WFL  Active  Right eval Left eval  Hip flexion    Hip extension    Hip abduction    Hip adduction    Hip internal rotation    Hip external rotation    Knee flexion    Knee extension    Ankle dorsiflexion    Ankle plantarflexion    Ankle inversion    Ankle eversion     (Blank rows = not tested)  LOWER EXTREMITY MMT:    MMT Right eval Left eval  Hip flexion 4+ 4+  Hip extension    Hip abduction 4+ 4+  Hip adduction 5 5  Hip internal rotation    Hip external rotation    Knee flexion    Knee extension    Ankle dorsiflexion 5- 5-  Ankle plantarflexion    Ankle inversion    Ankle eversion     (Blank rows = not tested)  LUMBAR SPECIAL TESTS:  Straight leg raise test: Negative  FUNCTIONAL TESTS:  5 times sit to stand: 16 sec   GAIT: Distance walked: Medical Sales Representative device utilized: None Level of assistance: Complete Independence Comments: ---  TREATMENT DATE:  10/08/24 Initial evaluation of lumbar spine completed followed by trial set and instruction of HEP.  PATIENT EDUCATION:  Education details: HEP Person educated: Patient Education method: Programmer, Multimedia, Facilities Manager, Actor cues, Verbal cues, and Handouts Education comprehension: verbalized understanding, returned demonstration, and verbal cues required  HOME EXERCISE PROGRAM: Access Code: [email or web address] URL: [email or web address] Date: 10/08/2024 Prepared by: Burnard Meth  Exercises - Supine Bridge  - 2 x daily - 7 x weekly - 2 sets - 10 reps - 2 hold - Supine Lower Trunk Rotation  - 2 x daily - 7 x weekly - 2 sets - 20 reps - Supine Posterior Pelvic Tilt  - 2 x daily - 7 x weekly - 2 sets - 10 reps - 2 hold - Supine Single Knee to Chest Stretch  - 2 x daily - 7 x weekly - 1 sets - 3 reps - 25  hold  ASSESSMENT:  CLINICAL IMPRESSION: Patient is a 59y.o. female who was seen today for physical therapy evaluation and treatment for low back pain with left sciatica.  Patient presents with decreased ROM, decreased strength and pain. Patient would benefit from skilled physical therapy services to address the above deficits and return to previous level of activity.  OBJECTIVE IMPAIRMENTS: decreased mobility, decreased ROM, decreased strength, impaired flexibility, and pain.   ACTIVITY LIMITATIONS: lifting, bending, standing, squatting, and transfers  PARTICIPATION LIMITATIONS: community activity and occupation  PERSONAL FACTORS: 1 comorbidity: obesity are also affecting patient's functional outcome.   REHAB POTENTIAL: Good  CLINICAL DECISION MAKING: Stable/uncomplicated  EVALUATION COMPLEXITY: Moderate   GOALS: Goals reviewed with patient? Yes  SHORT TERM GOALS: Target date: 10/28/2024  3 weeks  Patient to be independent with HEP. Baseline: Goal status: INITIAL  2.  Decrease pain by 1 level. Baseline:  Goal status: INITIAL  LONG TERM GOALS: Target date: 12/03/2024  8 weeks    Patient to be independent with self progressive HEP at discharge. Baseline:  Goal status: INITIAL  2.  Increase strength of lumbar stabilizers and hip, lower extremity musculature by half a grade to 1 full grade. Baseline:  Goal status: INITIAL  3.  Decrease pain to max 2 out of 10 with all activities. Baseline:  Goal status: INITIAL  4.  Increase score of PSFS by 3 points for measurable improvement. Baseline:  Goal status: INITIAL  5.  Increase active range of motion by 25% or more. Baseline:  Goal status: INITIAL PLAN:  PT FREQUENCY: 2x/week  PT DURATION: 8 weeks  PLANNED INTERVENTIONS: 97164- PT Re-evaluation, 97750- Physical Performance Testing, 97110-Therapeutic exercises, 97530- Therapeutic activity, 97112- Neuromuscular re-education, 97535- Self Care, 02859- Manual therapy,  [phone]- Aquatic Therapy, G0283- Electrical stimulation (unattended), [phone] (1-2 muscles), [id number] (3+ muscles)- Dry Needling, Patient/Family education, Balance training, Stair training, Joint mobilization, Spinal mobilization, Scar mobilization, Cryotherapy, and Moist heat.  PLAN FOR NEXT SESSION: Review HEP   Burnard CHRISTELLA Meth, PT 10/08/2024, 9:40 AM  "

## 2024-10-08 ENCOUNTER — Other Ambulatory Visit: Payer: Self-pay

## 2024-10-08 ENCOUNTER — Ambulatory Visit

## 2024-10-08 DIAGNOSIS — M6281 Muscle weakness (generalized): Secondary | ICD-10-CM | POA: Diagnosis not present

## 2024-10-08 DIAGNOSIS — M5459 Other low back pain: Secondary | ICD-10-CM | POA: Diagnosis not present

## 2024-10-09 ENCOUNTER — Encounter: Admit: 2024-10-09 | Discharge: 2024-10-09 | Payer: PRIVATE HEALTH INSURANCE | Attending: Family | Primary: Family

## 2024-10-09 DIAGNOSIS — Z1159 Encounter for screening for other viral diseases: Principal | ICD-10-CM

## 2024-10-09 DIAGNOSIS — B20 Human immunodeficiency virus [HIV] disease: Principal | ICD-10-CM

## 2024-10-09 DIAGNOSIS — A6000 Herpesviral infection of urogenital system, unspecified: Principal | ICD-10-CM

## 2024-10-09 DIAGNOSIS — Z113 Encounter for screening for infections with a predominantly sexual mode of transmission: Principal | ICD-10-CM

## 2024-10-09 DIAGNOSIS — Z79899 Other long term (current) drug therapy: Principal | ICD-10-CM

## 2024-10-09 DIAGNOSIS — Z9189 Other specified personal risk factors, not elsewhere classified: Principal | ICD-10-CM

## 2024-10-09 DIAGNOSIS — Z131 Encounter for screening for diabetes mellitus: Principal | ICD-10-CM

## 2024-10-09 DIAGNOSIS — Z5181 Encounter for therapeutic drug level monitoring: Principal | ICD-10-CM

## 2024-10-09 LAB — BASIC METABOLIC PANEL
ANION GAP: 14 mmol/L (ref 5–14)
BLOOD UREA NITROGEN: 16 mg/dL (ref 9–23)
BUN / CREAT RATIO: 18
CALCIUM: 9.1 mg/dL (ref 8.7–10.4)
CHLORIDE: 102 mmol/L (ref 98–107)
CO2: 28.3 mmol/L (ref 20.0–31.0)
CREATININE: 0.9 mg/dL (ref 0.55–1.02)
EGFR CKD-EPI (2021) FEMALE: 73 mL/min/1.73m2 (ref >=60)
GLUCOSE RANDOM: 109 mg/dL (ref 70–179)
POTASSIUM: 3 mmol/L — ABNORMAL LOW (ref 3.4–4.8)
SODIUM: 144 mmol/L (ref 135–145)

## 2024-10-09 LAB — CBC W/ AUTO DIFF
BASOPHILS ABSOLUTE COUNT: 0 10*9/L (ref 0.0–0.1)
BASOPHILS RELATIVE PERCENT: 0.3 %
EOSINOPHILS ABSOLUTE COUNT: 0 10*9/L (ref 0.0–0.5)
EOSINOPHILS RELATIVE PERCENT: 0.1 %
HEMATOCRIT: 40.4 % (ref 34.0–44.0)
HEMOGLOBIN: 12.8 g/dL (ref 11.3–14.9)
LYMPHOCYTES ABSOLUTE COUNT: 1.9 10*9/L (ref 1.1–3.6)
LYMPHOCYTES RELATIVE PERCENT: 15.4 %
MEAN CORPUSCULAR HEMOGLOBIN CONC: 31.6 g/dL — ABNORMAL LOW (ref 32.0–36.0)
MEAN CORPUSCULAR HEMOGLOBIN: 28.5 pg (ref 25.9–32.4)
MEAN CORPUSCULAR VOLUME: 90.1 fL (ref 77.6–95.7)
MEAN PLATELET VOLUME: 7.8 fL (ref 6.8–10.7)
MONOCYTES ABSOLUTE COUNT: 0.9 10*9/L — ABNORMAL HIGH (ref 0.3–0.8)
MONOCYTES RELATIVE PERCENT: 6.7 %
NEUTROPHILS ABSOLUTE COUNT: 9.8 10*9/L — ABNORMAL HIGH (ref 1.8–7.8)
NEUTROPHILS RELATIVE PERCENT: 77.5 %
PLATELET COUNT: 465 10*9/L — ABNORMAL HIGH (ref 150–450)
RED BLOOD CELL COUNT: 4.48 10*12/L (ref 3.95–5.13)
RED CELL DISTRIBUTION WIDTH: 13.8 % (ref 12.2–15.2)
WBC ADJUSTED: 12.7 10*9/L — ABNORMAL HIGH (ref 3.6–11.2)

## 2024-10-09 LAB — SYPHILIS SCREEN: SYPHILIS RPR SCREEN: NONREACTIVE

## 2024-10-09 LAB — LYMPH MARKER LIMITED,FLOW
ABSOLUTE CD3 CNT: 1463 {cells}/uL (ref 915–3400)
ABSOLUTE CD4 CNT: 1083 {cells}/uL (ref 510–2320)
ABSOLUTE CD8 CNT: 380 {cells}/uL (ref 180–1520)
CD3% (T CELLS): 77 % (ref 61–86)
CD4% (T HELPER): 57 % (ref 34–58)
CD4:CD8 RATIO: 2.9 (ref 0.9–4.8)
CD8% T SUPPRESR: 20 % (ref 12–38)

## 2024-10-09 LAB — HEPATITIS C ANTIBODY
HCV S/CO VALUE: 0.05
HEPATITIS C ANTIBODY: NONREACTIVE

## 2024-10-09 LAB — BILIRUBIN, TOTAL: BILIRUBIN TOTAL: 0.2 mg/dL — ABNORMAL LOW (ref 0.3–1.2)

## 2024-10-09 LAB — HEMOGLOBIN A1C
ESTIMATED AVERAGE GLUCOSE: 126 mg/dL
HEMOGLOBIN A1C: 6 % — ABNORMAL HIGH (ref 4.8–5.6)

## 2024-10-09 LAB — ALT: ALT (SGPT): 15 U/L (ref 10–49)

## 2024-10-09 LAB — AST: AST (SGOT): 9 U/L (ref <=34)

## 2024-10-09 MED ORDER — VALACYCLOVIR 1 GRAM TABLET
ORAL_TABLET | Freq: Every day | ORAL | 3 refills | 90.00000 days | Status: CP
Start: 2024-10-09 — End: ?
  Filled 2024-10-16: qty 90, 90d supply, fill #0

## 2024-10-09 MED ORDER — GENVOYA 150 MG-150 MG-200 MG-10 MG TABLET
ORAL_TABLET | Freq: Every day | ORAL | 3 refills | 90.00000 days | Status: CP
Start: 2024-10-09 — End: ?
  Filled 2024-10-16: qty 90, 90d supply, fill #0

## 2024-10-09 NOTE — Progress Notes (Signed)
 [facility]  [address]  [place], [place]  [address]  P [phone]  F [phone]     Primary care provider: Glendia Karen Strickland, Karen Strickland    Assessment/Plan:      HIV (dx'd 2004)  - chronic, stable  Health Department came to her home and told she needed testing.  Participated in study that started her on Edurant (RPV) and Truvada(FTC/TDF)-equivalent to Complera  Started on Odefsy which is covered on current insurance plan in 01/2021. (Patient's insurance did not cover for Complera. Odefsy is equivalent, but has TAF rather than TDF. Patient amenable to starting this.)    Overall doing well. Current regimen: Genvoya  (EVG/c/FTC/TAF)   Misses doses of ARVs never     Med access through insurance  CD4 count see below  Discussed ARV adherence and taking ARVs with food    Lab Results   Component Value Date    ACD4 1,116 05/03/2023    CD4 62 (H) 05/03/2023    HIVRS Detected (A) 12/29/2023    HIVCP <20 (H) 12/29/2023     CD4, HIV RNA, and safety labs (full return panel)  Continue current therapy. Started on Genvoya  on 11/19/21. Refills provided.  Discussed importance of ARV adherence  Previously discussed Cabenuva with patient and she is willing to come to clinic for injections but patient recently has canceled multiple appointments with me and has exacerbated depression and anxiety that could be a barrier to consistent attendance of appointments. Once she is a good candidate for Cabenuva, will need monitoring with PHQ9.  Engaged with a therapist. See below.  Meeting with Benefits today and SWOD.  Assessment & Plan    Mental Health - chronic, exacerbated  Patient reports feeling depressed and overwhelmed due to multiple life stressors.  - Engaged with therapist in [place] but does not feel this is helping.  - Currently on Effexor  but does not feel that it is helping  - Karen Strickland in to see patient to assess. Per SW, patient will be connected with more indepth therapy and psychiatrist to manage meds. Patient in better state of mind after meeting with SWOD.    Herpes Simplex Virus (HSV) Infection (noted in CareEverywhere on 03/2020)- chronic, controlled  Unable to see HSV testing.  Patient also has oral lesions, denies any genital lesions.  On daily Valacyclovir  with no outbreaks in 3-4 years, good adherence.  - Continue Valacyclovir  1 gram daily. Refills provided    Prediabetes - chronic  Aware of prediabetic status, advised to follow up with primary care.  - Follow up with primary care provider for management of prediabetes.   - deferred referral to Dietician for now due to feeling overwhelmed.  - Obtain A1C today which is improved from last time. 6.0% today.  - Will keep 11/05/24 appointment. Will cancel 10/11/24 appointment. PCP aware.    Bone Health  At risk for osteoporosis due to age, bone scan needed.  - Order bone scan to assess for osteoporosis.  - Phone number provided to schedule appointment. - not yet done.      Chronic conditions managed by PCP, Strickland Karen, Karen Strickland:  Patient has not been able to have appointment with PCP and running out of medications. I have sent in 3 months supply of medications to bridge until her next appointment with PCP. She states she will call to make appointment.  Hypertension  - chronic, stable - 121/87 today, Continue Amlodipine    Depression and Anxiety - chronic, exacerbated - On  Effexor    Obesity - BMI 40.61 - chronic - Had lost some weight but with exacerbation of depression, has put some weight back on. Weight neutral ART regimen if needing to change ART.       Sexual health & secondary prevention  - chronic, stable  Not in relationship.  LSE abstinent  Parts of body used during sex include: mouth and vagina. Does not have anal sex. Gives and receives oral sex. Receptive partner for vaginal sex.   In past 6 months has had no sex and has not had add'l STI screening.  She uses condoms  She does routinely discuss HIV status with partner(s).  Have not discussed interest in having children.    Lab Results   Component Value Date    RPR Nonreactive 08/30/2023    RPR Nonreactive 05/03/2023    CTNAA Negative 08/30/2023    GCNAA Negative 08/30/2023    SPECSOURCE Patient-collected Vaginal Swab 08/30/2023     GC/CT NAATs - patient declined screening  RPR - for screening obtained today      Health maintenance  - chronic, stable  PCP: Karen Strickland, Karen Strickland, has appointment 11/05/24    Oral health  She does  have a dentist. Last dental exam 09/2020.    Eye health  She does  use corrective lenses. Last eye exam 08/2020. Early glaucoma    Metabolic conditions  Wt Readings from Last 5 Encounters:   10/09/24 (!) 121.9 kg (268 lb 12.8 oz)   07/11/24 (!) 116.6 kg (257 lb)   05/03/24 (!) 118 kg (260 lb 3.2 oz)   12/29/23 (!) 118.3 kg (260 lb 12.8 oz)   08/30/23 (!) 122.1 kg (269 lb 3.2 oz)     Lab Results   Component Value Date    CREATININE 0.90 10/09/2024    GLUCOSEU Negative 09/15/2022    ALBCRERAT 8.4 12/29/2023    GLU 109 10/09/2024    A1C 6.0 (H) 10/09/2024    ALT 15 10/09/2024    ALT 32 07/11/2024    ALT 36 12/29/2023     # Kidney health - SCr and eGFR  # Bone health - DEXA orders placed  # Diabetes assessment - check random glucose  check A1c  # NAFLD assessment - monitor over time    Communicable diseases  Lab Results   Component Value Date    HEPAIGG Reactive (A) 04/24/2021    HEPBSAB Reactive (A) 04/24/2021     # TB screening - no longer needed; negative IGRA, low risk 01/04/2019  # Hepatitis screening - as noted: (07/21/20) HepB sAg NR, HepC Ab NR, (04/24/21) HepB sAb REACTIVE, HepB cAb NR, HepA REACTIVE Hep A/B IMMUNE  # MMR screening - 01/04/19 Measles immune    Cancer screening  No results found for: PSASCRN, PSA, PAP, FINALDX  # Cervical - Managed by GYN  # Breast - Managed by GYN    # Anorectal - anal cytology w/hrHPV co-testing (non-reflex).  # Colorectal - Defer to PCP  # Liver - no screening indicated  # Lung - screening not indicated    Cardiovascular disease  Lab Results Component Value Date    CHOL 237 (H) 07/11/2024    HDL 86 07/11/2024    LDL 132 (H) 07/11/2024    NONHDL 151 (H) 07/11/2024    TRIG 112 07/11/2024     # The 10-year ASCVD risk score (Arnett DK, et al., 2019) is: 4.2%  - is not taking aspirin   - is not  taking statin  - BP control good  - never smoker  # AAA screening - no indication for screening    Immunization History   Administered Date(s) Administered    COVID-19 VAC,BIVALENT,MODERNA(BLUE CAP) 03/02/2022    COVID-19 VACC,MRNA(BOOSTER)OR(6-96YR)MODERNA 02/11/2021    COVID-19 VACCINE,MRNA(MODERNA)(PF) 02/13/2020, 03/12/2020    Covid-19 Vac, (16yr+) (Spikevax) Monovalent Moderna 09/15/2022    DTaP, Unspecified Formulation 03/09/1966, 04/08/1966, 05/10/1966, 05/17/1968    Hepatitis B vaccine, pediatric/adolescent dosage, 05/17/2003, 06/18/2003, 11/22/2003    INFLUENZA TIV (TRI) 64MO+ W/ PRESERV (IM) 09/27/2011, 06/07/2012, 07/20/2012, 06/08/2013, 06/03/2015, 08/06/2019    INFLUENZA VACCINE IIV3(IM)(PF)6 MOS UP 08/30/2023    Influenza Vaccine Quad (IIV4 W/PRESERV) 64MO+ 09/27/2011, 06/07/2012, 06/08/2013, 06/03/2015, 08/06/2019    Influenza Vaccine Quad(IM)6 MO-Adult(PF) 06/03/2015, 08/06/2019, 06/25/2020, 10/30/2021, 09/15/2022    Influenza Virus Vaccine, unspecified formulation 09/27/2011, 06/07/2012, 07/20/2012, 06/08/2013, 06/03/2015    Measles 04/12/1969    PNEUMOCOCCAL POLYSACCHARIDE 23-VALENT 07/20/2005, 01/08/2011    PPD Test 11/17/2004, 01/20/2006, 01/08/2011, 02/04/2012    Pneumococcal Conjugate 13-Valent 10/27/2009, 02/04/2012    Pneumococcal Conjugate 20-valent 10/30/2021    Pneumococcal vaccine, Unspecified Formulation 04/02/2003    Polio Virus Vaccine, Unspecified Formulation 03/09/1966, 05/10/1966, 05/17/1968    Rubella 12/26/1980    TD(TDVAX),ADSORBED,2LF(IM)(PF) 04/02/2003    TD, ADSORBED, 5LF TETANUS TOXOID, PF(TENIVAC/DECAVAC) 09/20/2002    TdaP 10/27/2008, 01/08/2011    Tetanus 09/08/1982    Zoster Vaccine, Unspecified Formulation 08/06/2019, 10/08/2019     Immunizations today - no immunizations needed reports having flu/Covid vaccines 25-26 season.  Needs Menveo      I personally spent 55 minutes face-to-face and non-face-to-face in the care of this patient, which includes all pre, intra, and post visit time on the date of service.  All documented time was specific to the E/M visit and does not include any procedures that may have been performed.      Disposition  Next appointment: 5-6 months       To do @ next RTC  Established with Psych and Therapy?  Anal pap    Karen Gunner, Karen Strickland  [facility] at [facility]  [address], [place], [place] [address]    Phone: [phone]   Fax: [phone]           Subjective      Chief Complaint   Routine HIV follow up    October 17, 2024  Brother passed away in 2024-04-21 grief about this just hitting her.   Worried about the future - aging in [place], who is going to take care of her. Does not feel that she has much of a support system.  In therapy but does not feel like it is helpful, wants therapy to go deeper. I want to know what's wrong with me  Relationship with partner deteriorated after her mental health deteriorated due to grief. Felt that he was not supportive and they had a bad breakup in 06/2024.   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   Has arthritis in her back, new diagnosis. Off from work x 4 weeks. 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 It makes her scared for the future because she does not have anyone looking out  for her.  She is  overwhelmed and aggravated emotionally drained  Does not like that she has gained weight back. Had been on steroids for the last 7 days for her back pain.   Denies interim illnesses  Seen in ED recently for back pain.    12/29/23  Karen Strickland is a 59 year old female with HIV who presents for routine follow-up.    She is currently managed with Genvoya  for her HIV, which she tolerates well without any issues. She has been adherent to her medication regimen and finds her prescriptions effective, with no recent side effects or complications related to her HIV management.    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 No recent sexual activity, with the last occurrence being last year.    She has not had a herpes outbreak in three to four years and is taking Valtrex  daily. There was a period when it was difficult to obtain the medication, but she has been stable since then.    She is prediabetic and acknowledges the need to manage this condition. 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    She is concerned about aging-related issues, mentioning fatigue despite being very active. She attributes some of her tiredness to aging and plans to start working out to improve her energy levels. She is taking vitamins and has been losing weight, aiming to reach 230 pounds.    She is considering starting therapy and has identified a therapist, although she needs to confirm the appointment. She reports being in a good mental state, saying she is at a good place.    She expresses concern about her bone health and mentions the need for a bone scan to check for osteoporosis. She also mentions paranoia about her health, wanting to ensure everything is fine as she plans for retirement in seven years.    She lives in [place] and prefers to keep her healthcare providers in her current location. She continues to live with daughter and is working on paying off debts from [place].    08/30/23:  Has new partner x 2 months. They met online. He's from [place]. Things have been going well. She has not gotten answers regarding partner's STI/HIV status. She will have this conversation with him.  Continued but stable stress from work and family. Not speaking with middle daughter who she lives with but on good terms with her oldest daughter.  Would like to start Cabenuva. Will touchbase in 3 months.  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 All others negative.      Medications and Allergies  She has a current medication list which includes the following prescription(s): amlodipine , genvoya , estradiol , gabapentin , polyethylene glycol, simethicone , valacyclovir , and venlafaxine .    Allergies: Patient has no known allergies.      Family History  Her family history includes Asthma in her maternal aunt and maternal grandmother; COPD in her mother; Cancer in her maternal aunt, maternal grandmother, and son; Depression in her mother; Diabetes in her brother and father; Heart disease in her maternal aunt and mother; Hypertension in her brother, father, and mother; Kidney disease in her paternal aunt and paternal uncle.              Objective:      BP 136/85 (BP Site: L Arm, BP Position: Sitting, BP Cuff Size: Medium)  - Pulse 91  - Temp 36.4 ??C (97.6 ??F) (Oral)  - Ht 170.2 cm (5' 7)  - Wt (!) 121.9 kg (268 lb 12.8 oz)  - BMI 42.10 kg/m??   Wt Readings from Last 3 Encounters:   10/09/24 (!) 121.9 kg (268 lb 12.8 oz)   07/11/24 (!) 116.6 kg (257 lb)   05/03/24 (!) 118 kg (260 lb 3.2 oz)       Const attentive, alert, appropriate, tearful at times.   Eyes sclerae anicteric, noninjected OU   ENT deferred   Lymph no cervical or supraclavicular LAD           GI Soft, no organomegaly. NTND. NABS.   GU deferred No CVA tenderness   Rectal deferred   Skin no petechiae, ecchymoses or obvious rashes on clothed exam   MSK normal ROM throughout       Psych Appropriate affect. Eye contact good. Linear thoughts. Fluent speech.     Laboratory Data  Reviewed in Epic today, using Synopsis and Chart Review filters.    Lab Results   Component Value Date    CREATININE 0.90 10/09/2024    CHOL 237 (H) 07/11/2024    HDL 86 07/11/2024    LDL 132 (H) 07/11/2024    NONHDL 151 (H) 07/11/2024    TRIG 112 07/11/2024    A1C 6.0 (H) 10/09/2024

## 2024-10-09 NOTE — Patient Instructions (Signed)
 COVID Education:  Make sure you perform good hand washing (lasting 20 seconds), continue to social distance and limit close personal contact (which may include new sexual partners or having multiple partners during this period).  Try to isolate at home but please find ways to keep in touch with those close to you, such as meeting up with them electronically or socially distanced, and the ability to go outdoors alone or separated from others  If you become ill with fever, respiratory illness, sudden loss of taste and smell, stomach issues, diarrhea, nausea, vomiting - contact clinic for further instructions.  You should go to the emergency department if you develop systems such as shortness of breath, confusion, lightheadedness when standing, high fever.   Here is some information about HIV and CoVid vaccines: [email or web address]  If you're interested in receiving the CoVid vaccine when you're eligible, here are some resources for you to check and make an appointment:  Your local health department   [email or web address] through [facility]  [email or web address]  [facility] (if you are an established patient with them)  [email or web address]    URGENT CARE  Please call ahead to speak with the nursing staff if you are in need of an urgent appointment.       MEDICATIONS  For refills please contact your pharmacy and ask them to electronically send or fax the request to the clinic.   Please bring all medications in original bottles to every appointment.    HMAP (formerly ADAP) or [employer] Eligibility (required even if you do not receive medication through [facility])  Please remember to renew your [employer] eligibility during renewal periods which occur twice a year: January-March and July-September.     The following are needed for each renewal:   - [place]  Identification (if you don't have one, then a bill with your name and address in [place] )   - proof of income (award letter, W-2, or last three check stubs)   If you are unable to come in for renewal, let us  know if we can mail, fax or e-mail paperwork to you.   HMAP Contact: [phone].     Lab info:  Your most recent CD4 T-cell counts and viral loads are below. Here are a few things to keep in mind when looking at your numbers:  Our goal is to get your virus to be undetectable and keep it undetectable. If the virus is undetectable you are much more likely to stay healthy.  We consider your viral load to be undetectable if it says <40 or if it says Not detected.  For most people, we're checking CD4 counts every other visit (once or twice a year, or sometimes even less).  It's normal for your CD4 count to be different from visit to visit.   You can help by taking your medications at about the same time, every single day. If you're having trouble with taking your medications, it's important to let us  know.    Lab Results   Component Value Date    ACD4 1,116 05/03/2023    CD4 62 (H) 05/03/2023    HIVCP <20 (H) 12/29/2023    HIVRS Detected (A) 12/29/2023        Please note that your laboratory and other results may be visible to you in real time, possibly before they reach your provider. Please allow 48 hours for clinical interpretation of these results. Importantly, even if a result is flagged as abnormal, it may not be one that  impacts your health.    It was nice to have a visit with you today!  Follow-up information:        Provider today:  Nikoloz Huy, FNP-BC      ID CLINIC address:   [facility]  [address]  [place], [place] [address]    Contact information:    The ID clinic phone number is [phone]   The ID clinic fax number is [phone]  For urgent issues on nights and weekends: Call the ID Physician on-call through the [facility] Operator at [phone].    Please sign up for My [place] Chart - This is a great way to review your labs and track your appointments    Please try to arrive 30 minutes BEFORE your scheduled appointment time!  This will give you time to fill out any front desk paperwork needed for your visit, and allow you to be seen as close to your scheduled appointment time as possible.

## 2024-10-09 NOTE — Progress Notes (Signed)
 Name: Karen Strickland  Date: 10/09/2024  Address: [address]  [place] [address]   [place] of Residence:  [place]  Phone: [phone]     Started assessment with patient options: in clinic    Is this the same address for mailing? No  If no, Mailing Address is:     What is your preferred method of contact? Phone Call or email    Is there anyone that you would want to add as your personal contact? No; if yes, please use SmartPhrase RWPersonalContactInfo to gather their contacts information.    N/a    Housing Status  Stable/Permanent; if so, what is their housing type: Permanent placement with families or other self - sufficient arrangements     Nutritional Therapist    Marital Status  Widowed    Tax Press Photographer Status  Single    Employment Status  Employed Full Time    Income  Salary/Wages    If no or low income, how are you meeting your basic needs?  Not Applicable    List Tax Household Members including relationship to you:   N/a    Someone in my household receives: No Household Income/Deductions of any kind  Specify who: n/a    Do you or anyone in your home have income adjustments? No    If yes, which adjustments do you have? N/A      Medication Access/Barriers: none    Do you have a current diagnosis for Hepatitis C?  No results found for: HEPCAB, HCVR, HCVRNA, HCVIU, HCVRNAIU, HCVGENOTYPE    Federal Marketplace Eligibility Assessment  Patient has affordable insurance through [employer], [place], and or Employment and is not eligible.    Patient given ACA education if they qualified based on answers to questions above.     MyChart  Do you have an active MyChart account? Yes     If MyChart is not set up, informed patient on how to set up MyChart N/A    Patient was informed of the following programs;   N/A    The following applications/handouts were given to patient:   N/A    The following forms were also started with the patient:   N/A    Medicaid:  N/A      [place]/HMAP application status: Complete    Patient is applying for Caps on Charges Only     Additional Comments: No issues accessing meds. Sent income via email.    ____________________________________________________________________    The patient provides verbal consent and agrees to the following:    RW Consent  I agree to notify the interviewer within 30 days about any changes to my address, financial resources, expenses, family situation, or health insurance coverage that may impact my eligibility for Department payment programs. I certify that the information I have provided is accurate and complete to the best of my knowledge. I understand that information provided may be verified by a state reviewer, and I agree to submit any necessary financial records required for this review. I also understand that my employer may be asked to verify information concerning my income.    Caps on Charges Consent  The [employer] Program requires that both insured and uninsured individuals be charged no more than a specified maximum amount in a calendar year, based on their individual income.                Katie Das  ID Clinic Benefits Counselor  Time of Intervention-41mins

## 2024-10-09 NOTE — Progress Notes (Signed)
 Referral Services Note     Duration of Intervention: 25 minutes    TYPE OF CONTACT: Face to Face - In Person    ASSESSMENT: Patient???s provider requested SW to assess the patient for additional mental health resources. Patient reported she has been engaged in therapy for the past two months but feels it has not been effective. Patient stated she has been diagnosed with PTSD and feels her current therapy is not adequately addressing her trauma. Patient expressed a desire for therapy focused on childhood trauma, trauma across life stages, and development of improved coping skills. Patient reported ongoing anxiety related to aging and future concerns, as well as difficulty prioritizing her own needs due to her children frequently requesting support. Patient stated her IM provider prescribes an antidepressant, which she feels is not effective at this time.    INTERVENTION:  SW collaborated with the patient to explore additional mental health treatment options. SW provided the patient with a printed list of therapists and psychiatrists covered by her insurance, obtained through the [employer] Today website, along with contact information. SW discussed the importance of finding a provider who specializes in trauma-informed care and PTSD.    PLAN:  Patient will review and utilize the mental health resources provided and follow up with potential providers. SW will continue to provide support as needed and updated the patient???s provider on assessment findings and recommendations.    Shawnmichael Parenteau, LCSWA  [place] [facility] Youth Social Work

## 2024-10-10 LAB — HIV RNA, QUANTITATIVE, PCR
HIV RNA QNT RSLT: DETECTED — AB
HIV RNA: <20 {copies}/mL — ABNORMAL HIGH (ref <0)

## 2024-10-10 NOTE — Telephone Encounter (Signed)
 Noted decrease in potassium (K < 3.3), spoke with Patient to assess status     Status Additional Notes   Vomiting? No    Diarrhea? No    Fatigue or weakness?  No    Diuretics (furosemide, torsemide, bumetanide, metolazone)? No      Has history of lower potassium which has self corrected in the past.   Patient counseled to increase dietary intake of foods high in potassium including:   - Fruits: bananas, oranges, cantaloupe, honeydew, apricots, grapefruit   - Vegetables:  spinach, broccoli, tomatoes  - Other:  potatoes, sweet potatoes, legumes     Likes the vegetables instead of fruits and potatoes for potassium source. Will increase intake. Advised to keep appointment with PCP in February.

## 2024-10-10 NOTE — Progress Notes (Signed)
 The [facility] has made a third and final attempt to reach this patient to refill the following medication: Genvoya .      We have left voicemails on the following phone numbers: [phone], have sent a MyChart message, and have sent a text message to the following phone numbers: [phone].    Dates contacted: 1/12, 1/16, 1/21  Last scheduled delivery: 07/11/24 (90 days)    The patient may be at risk of non-compliance with this medication. The patient should call the [facility] at [phone]  Option 4, then Option 4: Infectious Disease, Transplant to refill medication.    Aleck CHRISTELLA Gaskins, PharmD   [facility] Delivery [facility] Specialty Pharmacist

## 2024-10-10 NOTE — Progress Notes (Signed)
 Patient completed [employer] application. Benefits Counselor calculated income provided by patient. Patient is ineligible to receive RW B & C services except for Caps on Charges. Patients' income is over 300% [employer]. Expires: 09/19/2025- RW3    RW Eligibility Form informing patient about RW services and Caps on charges was sent to patient via MyChart Message             Katie Das  ID Clinic Benefits Counselor  Time of Intervention-51mins

## 2024-10-11 DIAGNOSIS — I10 Essential (primary) hypertension: Principal | ICD-10-CM

## 2024-10-11 MED ORDER — AMLODIPINE 5 MG TABLET
ORAL_TABLET | Freq: Every day | ORAL | 3 refills | 90.00000 days
Start: 2024-10-11 — End: ?

## 2024-10-11 NOTE — Progress Notes (Signed)
 [facility] Delivery Pharmacy Clinical Assessment & Refill Coordination Note    CAPRICIA SERDA, DOB: 1966-05-11  Phone: [phone] (home)     All above HIPAA information was verified with patient.     Was a nurse, learning disability used for this call? No    Specialty Medication(s):   Infectious Disease: Genvoya      Current Medications[1]     Changes to medications: Laryah reports no changes at this time.    Medication list has been reviewed and updated in Epic: Yes    Allergies[2]    Changes to allergies: No    Allergies have been reviewed and updated in Epic: Yes    SPECIALTY MEDICATION ADHERENCE     Genvoya  150-150-200-10 mg: 7 days of medicine on hand       Specialty medication is an injection or given on a cycle: No    Medication Adherence    Patient reported X missed doses in the last month: 0  Specialty Medication: Genvoya  150-150-200-10mg  QD  Patient is on additional specialty medications: No  Informant: patient          Specialty medication(s) dose(s) confirmed: Regimen is correct and unchanged.     Are there any concerns with adherence? No    Adherence counseling provided? Not needed    CLINICAL MANAGEMENT AND INTERVENTION      Clinical Benefit Assessment:    Do you feel the medicine is effective or helping your condition? Patient declined to answer    Clinical Benefit counseling provided? Labs from 09/2024 show evidence of clinical benefit    Adverse Effects Assessment:    Are you experiencing any side effects? No    Are you experiencing difficulty administering your medicine? No    Quality of Life Assessment:    Quality of Life    Rheumatology  Oncology  Dermatology  Cystic Fibrosis          How many days over the past month did your HIV  keep you from your normal activities? For example, brushing your teeth or getting up in the morning. Patient declined to answer    Have you discussed this with your provider? Not needed    Acute Infection Status:    Acute infections noted within Epic:  No active infections    Patient reported infection: None    Therapy Appropriateness:    Is the medication and dose appropriate considering the patient???s diagnosis, treatment, and disease journey, comorbidities, medical history, current medications, allergies, therapeutic goals, self-administration ability, and access barriers? Yes, therapy is appropriate and should be continued     Clinical Intervention:    Was an intervention completed as part of this clinical assessment? No    DISEASE/MEDICATION-SPECIFIC INFORMATION      N/A    HIV: Not Applicable    HIV ASSOCIATED LABS:     Lab Results   Component Value Date/Time    HIVRS Detected (A) 10/09/2024 09:34 AM    HIVRS Detected (A) 12/29/2023 04:15 PM    HIVRS Detected (A) 08/30/2023 05:19 PM    HIVCP <20 (H) 10/09/2024 09:34 AM    HIVCP <20 (H) 12/29/2023 04:15 PM    HIVCP <20 (H) 08/30/2023 05:19 PM    ACD4 1,083 10/09/2024 09:34 AM    ACD4 1,116 05/03/2023 03:20 PM    ACD4 855 09/15/2022 09:31 AM         PATIENT SPECIFIC NEEDS     Does the patient have any physical, cognitive, or cultural barriers? No  Is the patient high risk? No    Does the patient require physician intervention or other additional services (i.e., nutrition, smoking cessation, social work)? No    Does the patient have an additional or emergency contact listed in their chart? Yes    SOCIAL DETERMINANTS OF HEALTH     At the [facility], we have learned that life circumstances - like trouble affording food, housing, utilities, or transportation can affect the health of many of our patients.   That is why we wanted to ask: are you currently experiencing any life circumstances that are negatively impacting your health and/or quality of life? Patient declined to answer    Social Drivers of Health     Food Insecurity: No Food Insecurity (05/03/2024)    Hunger Vital Sign     Worried About Running Out of Food in the Last Year: Never true     Ran Out of Food in the Last Year: Never true   Tobacco Use: Low Risk (10/09/2024)    Patient History     Smoking Tobacco Use: Never     Smokeless Tobacco Use: Never     Passive Exposure: Not on file   Transportation Needs: No Transportation Needs (05/03/2024)    PRAPARE - Transportation     Lack of Transportation (Medical): No     Lack of Transportation (Non-Medical): No   Alcohol Use: Not At Risk (05/03/2024)    Alcohol Use     How often do you have a drink containing alcohol?: Never     How many drinks containing alcohol do you have on a typical day when you are drinking?: Not on file     How often do you have 5 or more drinks on one occasion?: Never   Housing: Low Risk (05/03/2024)    Housing     Within the past 12 months, have you ever stayed: outside, in a car, in a tent, in an overnight shelter, or temporarily in someone else's home (i.e. couch-surfing)?: No     Are you worried about losing your housing?: No   Physical Activity: Not on file   Utilities: Low Risk (05/03/2024)    Utilities     Within the past 12 months, have you been unable to get utilities (heat, electricity) when it was really needed?: No   Stress: Not on file   Interpersonal Safety: Not At Risk (05/03/2024)    Interpersonal Safety     Unsafe Where You Currently Live: No     Physically Hurt by Anyone: No     Abused by Anyone: No   Substance Use: Low Risk (05/03/2024)    Substance Use     In the past year, how often have you used prescription drugs for non-medical reasons?: Never     In the past year, how often have you used illegal drugs?: Never     In the past year, have you used any substance for non-medical reasons?: No   Intimate Partner Violence: Not At Risk (05/03/2024)    Humiliation, Afraid, Rape, and Kick questionnaire     Fear of Current or Ex-Partner: No     Emotionally Abused: No     Physically Abused: No     Sexually Abused: No   Social Connections: Not on file   Financial Resource Strain: Low Risk (05/03/2024)    Overall Financial Resource Strain (CARDIA)     Difficulty of Paying Living Expenses: Not hard at all   Health Literacy: Low Risk (05/03/2024)  Health Literacy     : Never   Internet Connectivity: Internet connectivity concern unknown (05/03/2024)    Internet Connectivity     Do you have access to internet services: Yes     How do you connect to the internet: Not on file     Is your internet connection strong enough for you to watch video on your device without major problems?: Yes     Do you have enough data to get through the month?: Yes     Does at least one of the devices have a camera that you can use for video chat?: Yes       Would you be willing to receive help with any of the needs that you have identified today? Not applicable       SHIPPING     Specialty Medication(s) to be Shipped:   Infectious Disease: Genvoya     Other medication(s) to be shipped: amlodipine , valacyclovir     Specialty Medications not needed at this time: N/A     Changes to insurance: No    Cost and Payment: Patient has a copay of $30. They are aware and have authorized the pharmacy to charge the credit card on file.    Delivery Scheduled: Yes, Expected medication delivery date: 10/17/24.     Medication will be delivered via UPS to the confirmed prescription address [facility].    The patient will receive a drug information handout for each medication shipped and additional FDA Medication Guides as required.  Verified that patient has previously received a Conservation Officer, Historic Buildings and a Surveyor, Mining.    The patient or caregiver noted above participated in the development of this care plan and knows that they can request review of or adjustments to the care plan at any time.      All of the patient's questions and concerns have been addressed.    Aleck CHRISTELLA Gaskins, PharmD   [facility] Specialty and Home Delivery Pharmacy Specialty Pharmacist       [1]   Current Outpatient Medications   Medication Sig Dispense Refill    amlodipine  (NORVASC ) 5 MG tablet Take 0.5 tablets (2.5 mg total) by mouth daily. 45 tablet 3 elvitegravir-cobicistat-emtricitabine-tenofovir (GENVOYA ) 150-150-200-10 mg Tab tablet Take 1 tablet by mouth daily. 90 tablet 3    estradiol  (ESTRACE ) 0.01 % (0.1 mg/gram) vaginal cream Insert 2 g into the vagina daily. 42.5 g 11    gabapentin  (NEURONTIN ) 100 MG capsule Take 3 capsules (300 mg total) by mouth nightly as needed. Take 300 mg at night as needed for hot flashes 90 capsule 3    polyethylene glycol (MIRALAX ) 17 gram packet Take 17 g by mouth daily. 30 packet 3    simethicone  (GAS-X) 180 mg capsule Take 1 capsule (180 mg total) by mouth every six (6) hours as needed for flatulence. 90 capsule 0    valACYclovir  (VALTREX ) 1000 MG tablet Take 1 tablet (1,000 mg total) by mouth daily. 90 tablet 3    venlafaxine  (EFFEXOR -XR) 75 MG 24 hr capsule Take 1 capsule (75 mg total) by mouth daily. Start taking lower dose on a day off from work 90 capsule 3     No current facility-administered medications for this visit.   [2] No Known Allergies

## 2024-10-11 NOTE — Telephone Encounter (Signed)
 Medication Requested: Amlodipine       Future Appointments   Date Time Provider Department Center   03/11/2025 11:30 AM Halsey, Corazon I, FNP [facility]     Per Provider Note: Hypertension  - chronic, stable - Continue Amlodipine      Standing order protocol requirements met?: Yes    Sent to: Provider    Days Supply Given: TBD  Number of Refills: TBD

## 2024-10-12 DIAGNOSIS — B20 Human immunodeficiency virus [HIV] disease: Principal | ICD-10-CM

## 2024-10-12 MED ORDER — AMLODIPINE 5 MG TABLET
ORAL_TABLET | Freq: Every day | ORAL | 3 refills | 90.00000 days | Status: CP
Start: 2024-10-12 — End: ?
  Filled 2024-10-16: qty 45, 90d supply, fill #0

## 2024-10-16 ENCOUNTER — Encounter: Admitting: Physical Therapy

## 2024-10-19 ENCOUNTER — Ambulatory Visit
Admission: EM | Admit: 2024-10-19 | Discharge: 2024-10-19 | Disposition: A | Discharge status: Home / Self Care | Attending: Family Medicine | Admitting: Family Medicine

## 2024-10-19 ENCOUNTER — Encounter: Payer: Self-pay | Admitting: Physical Therapy

## 2024-10-19 ENCOUNTER — Ambulatory Visit (INDEPENDENT_AMBULATORY_CARE_PROVIDER_SITE_OTHER): Admitting: Physical Therapy

## 2024-10-19 DIAGNOSIS — M5459 Other low back pain: Secondary | ICD-10-CM | POA: Diagnosis not present

## 2024-10-19 DIAGNOSIS — B9789 Other viral agents as the cause of diseases classified elsewhere: Secondary | ICD-10-CM

## 2024-10-19 DIAGNOSIS — R058 Other specified cough: Secondary | ICD-10-CM | POA: Diagnosis not present

## 2024-10-19 DIAGNOSIS — J988 Other specified respiratory disorders: Secondary | ICD-10-CM | POA: Diagnosis not present

## 2024-10-19 DIAGNOSIS — M6281 Muscle weakness (generalized): Secondary | ICD-10-CM

## 2024-10-19 LAB — POC COVID19/FLU A&B COMBO
Covid Antigen, POC: NEGATIVE
Influenza A Antigen, POC: NEGATIVE
Influenza B Antigen, POC: NEGATIVE

## 2024-10-19 MED ORDER — CETIRIZINE HCL 10 MG PO TABS: 10.0000 mg | ORAL_TABLET | Freq: Every day | ORAL | 0 refills | Status: AC

## 2024-10-19 MED ORDER — PSEUDOEPHEDRINE HCL 30 MG PO TABS: 30.0000 mg | ORAL_TABLET | Freq: Three times a day (TID) | ORAL | 0 refills | Status: AC | PRN

## 2024-10-19 MED ORDER — PROMETHAZINE-DM 6.25-15 MG/5ML PO SYRP: 5.0000 mL | ORAL_SOLUTION | Freq: Three times a day (TID) | ORAL | 0 refills | Status: AC | PRN

## 2024-10-19 NOTE — ED Provider Notes (Signed)
 " [facility]  Note:  This document was prepared using Conservation officer, historic buildings and may include unintentional dictation errors.  MRN: [id number] DOB: [birth year]  Subjective:   Kelli Stout is a 59 y.o. female presenting for 4-day history of a persistent productive cough with intermittent shortness of breath, runny nose.  No fever, chest pain, wheezing.  No asthma.  No smoking of any kind including cigarettes, cigars, vaping, marijuana use.  Patient does have secondhand exposure to smoke.  Would like testing for COVID and flu.  Current Outpatient Medications  Medication Instructions   amLODipine  (NORVASC ) 2.5 mg   cefadroxil  (DURICEF) 500 mg, Oral, 2 times daily   EPINEPHrine  (EPI-PEN) 0.3 mg, Intramuscular, As needed   estrogens, conjugated, (PREMARIN) 0.3 MG tablet TAKE 1 TABLET BY MOUTH DAILY   famotidine (PEPCID) 40 mg, Daily   GENVOYA 150-150-200-10 MG TABS tablet 1 tablet, Oral, Daily   LORazepam  (ATIVAN ) 0.5 mg, Oral, Every 6 hours PRN   meloxicam  (MOBIC ) 7.5 mg, Oral, 2 times daily PRN   methocarbamol  (ROBAXIN ) 750 mg, Oral, 2 times daily PRN   neomycin -polymyxin-hydrocortisone (CORTISPORIN) 3.5-10000-1 OTIC suspension Apply 1-2 drops daily after soaking and cover with bandaid   pantoprazole (PROTONIX) 40 mg, Daily   predniSONE  (STERAPRED UNI-PAK 21 TAB) 10 MG (21) TBPK tablet Take as directed   rilpivirine (EDURANT) 25 mg, Daily   solifenacin  (VESICARE ) 5 mg, Oral, Daily   triamterene -hydrochlorothiazide (MAXZIDE-25) 37.5-25 MG tablet TAKE 1/2 TABLET BY MOUTH DAILY   TRUVADA 200-300 MG per tablet 1 tablet, Daily   valACYclovir (VALTREX) 500 mg, Daily   venlafaxine XR (EFFEXOR-XR) 150 MG 24 hr capsule TAKE ONE CAPSULE BY MOUTH DAILY    Allergies[1]  Past Medical History:  Diagnosis Date   Anxiety    Chicken pox    Depression    HIV infection (HCC)    Hypertension    UTI (urinary tract infection)      Past Surgical History:   Procedure Laterality Date   ABDOMINAL HYSTERECTOMY  1989   partial   TOE SURGERY  09/01/2018   Left foot    Family History  Problem Relation Age of Onset   Hyperlipidemia Mother    Heart disease Mother    Hypertension Mother    Mental illness Mother    Hypertension Father    Diabetes Father        type ll   Colon cancer Maternal Aunt 28   Heart disease Maternal Uncle    Arthritis Maternal Grandmother    Cancer Maternal Grandmother        bladder   Cancer Son 35       osteosarcoma    Social History   Occupational History   Not on file  Tobacco Use   Smoking status: Never   Smokeless tobacco: Never  Vaping Use   Vaping status: Never Used  Substance and Sexual Activity   Alcohol use: No    Alcohol/week: 0.0 standard drinks of alcohol   Drug use: No   Sexual activity: Not Currently     ROS   Objective:   Vitals: BP (!) 151/85 (BP Location: Left Arm)   Pulse 100   Temp 98.8 F (37.1 C) (Oral)   Resp 18   SpO2 96%   Physical Exam Constitutional:      General: She is not in acute distress.    Appearance: Normal appearance. She is well-developed and normal weight. She is not ill-appearing, toxic-appearing or diaphoretic.  HENT:     Head: Normocephalic and atraumatic.     Right Ear: Tympanic membrane, ear canal and external ear normal. No drainage or tenderness. No middle ear effusion. There is no impacted cerumen. Tympanic membrane is not erythematous or bulging.     Left Ear: Tympanic membrane, ear canal and external ear normal. No drainage or tenderness.  No middle ear effusion. There is no impacted cerumen. Tympanic membrane is not erythematous or bulging.     Nose: Nose normal. No congestion or rhinorrhea.     Mouth/Throat:     Mouth: Mucous membranes are moist. No oral lesions.     Pharynx: No pharyngeal swelling, oropharyngeal exudate, posterior oropharyngeal erythema or uvula swelling.     Tonsils: No tonsillar exudate or tonsillar abscesses. 0 on  the right. 0 on the left.  Eyes:     General: No scleral icterus.       Right eye: No discharge.        Left eye: No discharge.     Extraocular Movements: Extraocular movements intact.     Right eye: Normal extraocular motion.     Left eye: Normal extraocular motion.     Conjunctiva/sclera: Conjunctivae normal.  Cardiovascular:     Rate and Rhythm: Normal rate and regular rhythm.     Heart sounds: Normal heart sounds. No murmur heard.    No friction rub. No gallop.  Pulmonary:     Effort: Pulmonary effort is normal. No respiratory distress.     Breath sounds: No stridor. No wheezing, rhonchi or rales.  Chest:     Chest wall: No tenderness.  Musculoskeletal:     Cervical back: Normal range of motion and neck supple.  Lymphadenopathy:     Cervical: No cervical adenopathy.  Skin:    General: Skin is warm and dry.  Neurological:     General: No focal deficit present.     Mental Status: She is alert and oriented to person, place, and time.  Psychiatric:        Mood and Affect: Mood normal.        Behavior: Behavior normal.     Results for orders placed or performed during the hospital encounter of 10/19/24 (from the past 24 hours)  POC Covid19/Flu A&B Antigen     Status: None   Collection Time: 10/19/24 12:59 PM  Result Value Ref Range   Influenza A Antigen, POC Negative Negative   Influenza B Antigen, POC Negative Negative   Covid Antigen, POC Negative Negative    Assessment and Plan :   PDMP not reviewed this encounter.  1. Viral respiratory infection   2. Productive cough      Deferred imaging given clear pulmonary exam. Suspect viral URI, viral syndrome. Physical exam findings reassuring and vital signs stable for discharge. Advised supportive care, offered symptomatic relief. Counseled patient on potential for adverse effects with medications prescribed/recommended today, ER and return-to-clinic precautions discussed, patient verbalized understanding.      [1] No  Known Allergies    Christopher Savannah, PA-C 10/19/24 1315  "

## 2024-10-19 NOTE — Therapy (Signed)
 " OUTPATIENT PHYSICAL THERAPY TREATMENT   Patient Name: Kelli Stout MRN: [id number] DOB:May 28, 1966, 59 y.o., female Today's Date: 10/19/2024  END OF SESSION:  PT End of Session - 10/19/24 1018     Visit Number 2    Number of Visits 17    Date for Recertification  12/03/24    PT Start Time 1020    PT Stop Time 1100    PT Time Calculation (min) 40 min    Activity Tolerance Patient tolerated treatment well    Behavior During Therapy [facility] for tasks assessed/performed           Past Medical History:  Diagnosis Date   Anxiety    Chicken pox    Depression    HIV infection (HCC)    Hypertension    UTI (urinary tract infection)    Past Surgical History:  Procedure Laterality Date   ABDOMINAL HYSTERECTOMY  1989   partial   TOE SURGERY  09/01/2018   Left foot   Patient Active Problem List   Diagnosis Date Noted   Acute medial meniscus tear of right knee 01/07/2021   Urinary urgency 05/11/2016   BPPV (benign paroxysmal positional vertigo) 06/08/2015   Lumbar radiculopathy 07/13/2013   HIV 11/17/2006   OBESITY, NOS 11/17/2006   Recurrent depression (HCC) 11/17/2006   HYPERTENSION, BENIGN SYSTEMIC 11/17/2006    PCP: Glendia Nelwyn NOVAK, PA   REFERRING PROVIDER: Jule Ronal CROME, PA-C   REFERRING DIAG:  Diagnosis  M54.42,G89.29 (ICD-10-CM) - Chronic midline low back pain with left-sided sciatica    Rationale for Evaluation and Treatment: Rehabilitation  THERAPY DIAG:  Other low back pain  Muscle weakness (generalized)  ONSET DATE: 09/18/24  SUBJECTIVE:                                                                                                                                                                                           SUBJECTIVE STATEMENT: Pt reports back is a 0/10. Not as bad now. Bending is when pain can increase as well as prolonged standing.   From eval: Pt states on 09/18/24 she works at [employer] and pushed a lot of  wheelchairs.  She woke up next day with LBP.  Had some radiating pain but that has resolved.  She is out of work for now.    PERTINENT HISTORY:  No spinal surgeries  PAIN:  Are you having pain? Yes: NPRS scale: 0/10 Pain location: Middle lumbar  Pain description: dull, tight Aggravating factors: pushing wheelchairs, bending over,standing Relieving factors: heat, medication  PRECAUTIONS: None  RED FLAGS: Bowel or bladder incontinence: No   WEIGHT BEARING  RESTRICTIONS: No  FALLS:  Has patient fallen in last 6 months? No  LIVING ENVIRONMENT: Lives with: lives alone Lives in: House/apartment Stairs: No Has following equipment at home: None  OCCUPATION: works at [employer]  PLOF: Independent  PATIENT GOALS: Decrease pain and return to work  NEXT MD VISIT: not listed  OBJECTIVE:  Note: Objective measures were completed at Evaluation unless otherwise noted.  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     4.      Total Score 6      Total Score = Sum of activity scores/number of activities  Minimally Detectable Change: 3 points (for single activity); 2 points (for average score)  [facility] (nd). The Patient Specific Functional Scale . Retrieved from [email or web address]   COGNITION: Overall cognitive status: Within functional limits for tasks assessed     SENSATION: WFL  MUSCLE LENGTH: Hamstrings: Right and Left moderate restriction   POSTURE: L knee hyperextended  PALPATION: 1+ at lumbar pvm  LUMBAR ROM:   AROM eval  Flexion  75%  Extension 75%  Right lateral flexion WNL  Left lateral flexion WNL  Right rotation 50%  Left rotation 50%   (Blank rows = not tested)  LOWER EXTREMITY ROM:   WFL  Active  Right eval Left eval  Hip flexion    Hip extension    Hip abduction    Hip adduction    Hip internal rotation    Hip external rotation    Knee flexion    Knee extension    Ankle dorsiflexion    Ankle plantarflexion    Ankle inversion    Ankle eversion     (Blank rows = not tested)  LOWER EXTREMITY MMT:    MMT Right eval Left eval  Hip flexion 4+ 4+  Hip extension    Hip abduction 4+ 4+  Hip adduction 5 5  Hip internal rotation    Hip external rotation    Knee flexion    Knee extension    Ankle dorsiflexion 5- 5-  Ankle plantarflexion    Ankle inversion    Ankle eversion     (Blank rows = not tested)  LUMBAR SPECIAL TESTS:  Straight leg raise test: Negative  FUNCTIONAL TESTS:  5 times sit to stand: 16 sec   GAIT: Distance walked: Medical Sales Representative device utilized: None Level of assistance: Complete Independence Comments: ---  TREATMENT DATE:  10/19/24 Supine hamstring stretch x 30 R&L Supine figure 4 stretch x 30 R&L Supine double knee to chest x30 Supine LTR x10 Supine bridge 10x3 Supine PPT 90/90 5x10 eccentric control bringing legs down Prone lumbar ext x10 Standing shoulder ext green TB 2x10x5 Standing palloff press green TB 2x10x5 Self care: bending biomechanics -- working on hip hinge   10/08/24 Initial evaluation of lumbar spine completed followed by trial set and instruction of HEP.  PATIENT EDUCATION:  Education details: HEP Person educated: Patient Education method: Programmer, Multimedia, Facilities Manager, Actor cues, Verbal cues, and Handouts Education comprehension: verbalized understanding, returned demonstration, and verbal cues  required  HOME EXERCISE PROGRAM: Access Code: [email or web address] URL: [email or web address] Date: 10/08/2024 Prepared by: Burnard Meth  Exercises - Supine Bridge  - 2 x daily - 7 x weekly - 2 sets - 10 reps - 2 hold - Supine Lower Trunk Rotation  - 2 x daily - 7 x weekly - 2 sets - 20 reps - Supine Posterior Pelvic Tilt  - 2 x daily - 7 x weekly - 2 sets - 10 reps - 2 hold - Supine Single Knee to Chest Stretch  - 2 x daily - 7 x weekly - 1 sets - 3 reps - 25 hold  ASSESSMENT:  CLINICAL IMPRESSION: Treatment focused on reviewing HEP. Worked on core strengthening in standing. Discussed safe bending mechanics to reduce lumbar strain.   From eval: Patient is a 58y.o. female who was seen today for physical therapy evaluation and treatment for low back pain with left sciatica.  Patient presents with decreased ROM, decreased strength and pain. Patient would benefit from skilled physical therapy services to address the above deficits and return to previous level of activity.  OBJECTIVE IMPAIRMENTS: decreased mobility, decreased ROM, decreased strength, impaired flexibility, and pain.   ACTIVITY LIMITATIONS: lifting, bending, standing, squatting, and transfers  PARTICIPATION LIMITATIONS: community activity and occupation  PERSONAL FACTORS: 1 comorbidity: obesity are also affecting patient's functional outcome.   REHAB POTENTIAL: Good  CLINICAL DECISION MAKING: Stable/uncomplicated  EVALUATION COMPLEXITY: Moderate   GOALS: Goals reviewed with patient? Yes  SHORT TERM GOALS: Target date: 10/28/2024  3 weeks  Patient to be independent with HEP. Baseline: Goal status: INITIAL  2.  Decrease pain by 1 level. Baseline:  Goal status: INITIAL  LONG TERM GOALS: Target date: 12/03/2024  8 weeks    Patient to be independent with self progressive HEP at discharge. Baseline:  Goal status: INITIAL  2.  Increase strength of lumbar stabilizers and hip, lower extremity musculature by  half a grade to 1 full grade. Baseline:  Goal status: INITIAL  3.  Decrease pain to max 2 out of 10 with all activities. Baseline:  Goal status: INITIAL  4.  Increase score of PSFS by 3 points for measurable improvement. Baseline:  Goal status: INITIAL  5.  Increase active range of motion by 25% or more. Baseline:  Goal status: INITIAL PLAN:  PT FREQUENCY: 2x/week  PT DURATION: 8 weeks  PLANNED INTERVENTIONS: 97164- PT Re-evaluation, 97750- Physical Performance Testing, 97110-Therapeutic exercises, 97530- Therapeutic activity, 97112- Neuromuscular re-education, 97535- Self Care, 02859- Manual therapy, [phone]- Aquatic Therapy, G0283- Electrical stimulation (unattended), [phone] (1-2 muscles), 20561 (3+ muscles)- Dry Needling, Patient/Family education, Balance training, Stair training, Joint mobilization, Spinal mobilization, Scar mobilization, Cryotherapy, and Moist heat.  PLAN FOR NEXT SESSION: Review HEP. Bending, pushing, pulling mechanics for work tasks.    Giani Winther April Ma L Cassaundra Rasch, PT 10/19/2024, 10:19 AM  "

## 2024-10-19 NOTE — ED Triage Notes (Signed)
 Pt reports cough x 4 days. Pt feels like she has pneumonia. OTC meds gives no relief.

## 2024-10-23 ENCOUNTER — Encounter

## 2024-10-24 NOTE — Therapy (Signed)
 " OUTPATIENT PHYSICAL THERAPY TREATMENT   Patient Name: Kelli Stout MRN: [id number] DOB:Sep 14, 1966, 59 y.o., female Today's Date: 10/25/2024  END OF SESSION:  PT End of Session - 10/25/24 1120     Visit Number 3    Number of Visits 17    Date for Recertification  12/03/24    PT Start Time 1100    PT Stop Time 1140    PT Time Calculation (min) 40 min    Activity Tolerance Patient tolerated treatment well    Behavior During Therapy [facility] for tasks assessed/performed            Past Medical History:  Diagnosis Date   Anxiety    Chicken pox    Depression    HIV infection (HCC)    Hypertension    UTI (urinary tract infection)    Past Surgical History:  Procedure Laterality Date   ABDOMINAL HYSTERECTOMY  1989   partial   TOE SURGERY  09/01/2018   Left foot   Patient Active Problem List   Diagnosis Date Noted   Acute medial meniscus tear of right knee 01/07/2021   Urinary urgency 05/11/2016   BPPV (benign paroxysmal positional vertigo) 06/08/2015   Lumbar radiculopathy 07/13/2013   HIV 11/17/2006   OBESITY, NOS 11/17/2006   Recurrent depression (HCC) 11/17/2006   HYPERTENSION, BENIGN SYSTEMIC 11/17/2006    PCP: Glendia Nelwyn NOVAK, PA   REFERRING PROVIDER: Jule Ronal CROME, PA-C   REFERRING DIAG:  Diagnosis  M54.42,G89.29 (ICD-10-CM) - Chronic midline low back pain with left-sided sciatica    Rationale for Evaluation and Treatment: Rehabilitation  THERAPY DIAG:  Other low back pain  Muscle weakness (generalized)  ONSET DATE: 09/18/24  SUBJECTIVE:                                                                                                                                                                                           SUBJECTIVE STATEMENT: Pt reports back is affected by cold.  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  She woke up next day with LBP.  Had some radiating pain but  that has resolved.  She is out of work for now.    PERTINENT HISTORY:  No spinal surgeries  PAIN:  Are you having pain? Yes: NPRS scale: 2/10 Pain location: Middle lumbar  Pain description: dull, tight Aggravating factors: pushing wheelchairs, bending over,standing Relieving factors: heat, medication  PRECAUTIONS: None  RED FLAGS: Bowel or bladder incontinence: No   WEIGHT BEARING RESTRICTIONS: No  FALLS:  Has patient fallen in last 6 months? No  LIVING ENVIRONMENT: Lives with: lives alone Lives in: House/apartment Stairs: No Has following equipment at home: None  OCCUPATION: works at [employer]  PLOF: Independent  PATIENT GOALS: Decrease pain and return to work  NEXT MD VISIT: not listed  OBJECTIVE:  Note: Objective measures were completed at Evaluation unless otherwise noted.  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     4.      Total Score 6      Total Score = Sum of activity scores/number of activities  Minimally Detectable Change: 3 points (for single activity); 2 points (for average score)  [employer] [facility] (nd). The Patient Specific Functional Scale . Retrieved from Skateo[employer].[email or web address]   COGNITION: Overall cognitive status: Within functional limits for tasks assessed     SENSATION: WFL  MUSCLE LENGTH: Hamstrings: Right and Left moderate restriction   POSTURE: L knee hyperextended  PALPATION: 1+ at lumbar pvm  LUMBAR ROM:   AROM eval  Flexion 75%  Extension 75%  Right lateral flexion WNL  Left lateral flexion  WNL  Right rotation 50%  Left rotation 50%   (Blank rows = not tested)  LOWER EXTREMITY ROM:   WFL  Active  Right eval Left eval  Hip flexion    Hip extension    Hip abduction    Hip adduction    Hip internal rotation    Hip external rotation    Knee flexion    Knee extension    Ankle dorsiflexion    Ankle plantarflexion    Ankle inversion    Ankle eversion     (Blank rows = not tested)  LOWER EXTREMITY MMT:    MMT Right eval Left eval  Hip flexion 4+ 4+  Hip extension    Hip abduction 4+ 4+  Hip adduction 5 5  Hip internal rotation    Hip external rotation    Knee flexion    Knee extension    Ankle dorsiflexion 5- 5-  Ankle plantarflexion    Ankle inversion    Ankle eversion     (Blank rows = not tested)  LUMBAR SPECIAL TESTS:  Straight leg raise test: Negative  FUNCTIONAL TESTS:  5 times sit to stand: 16 sec   GAIT: Distance walked: Medical Sales Representative device utilized: None Level of assistance: Complete Independence Comments: ---  TREATMENT DATE: Lumbar 10/25/24 Nustep level 5    LAQ 2.5# 2x10 bil Supine bridge 10x2 Hip abduction supine TB green 3x10 Supine Marching 2x20 Supine hamstring stretch 2x 30 R&L Supine figure 4 stretch 2x 30 R&L   Access Code: [email or web address] URL: [email or web address] Date: 10/25/2024 Prepared by: Burnard Meth  Exercises - Supine Bridge  - 2 x daily - 7 x weekly - 2 sets - 10 reps - 2 hold - Supine Lower Trunk Rotation  - 2 x daily - 7 x weekly - 2 sets - 20 reps - Supine Posterior Pelvic Tilt  - 2 x daily - 7 x weekly - 2 sets - 10 reps - 2 hold - Supine Single Knee to  Chest Stretch  - 2 x daily - 7 x weekly - 1 sets - 3 reps - 25 hold - Shoulder extension with resistance - Neutral  - 1 x daily - 7 x weekly - 2 sets - 10 reps - 5 sec hold - Standing Anti-Rotation Press with Anchored Resistance  - 1 x daily - 7 x weekly - 2 sets - 10 reps - 5 sec hold - Hooklying Clamshell with Resistance  - 2 x  daily - 7 x weekly - 2 sets - 10 reps - Supine March  - 2 x daily - 7 x weekly - 2 sets - 20 reps   10/19/24 Supine hamstring stretch x 30 R&L Supine figure 4 stretch x 30 R&L Supine double knee to chest x30 Supine LTR x10 Supine bridge 10x3 Supine PPT 90/90 5x10 eccentric control bringing legs down Prone lumbar ext x10 Standing shoulder ext green TB 2x10x5 Standing palloff press green TB 2x10x5 Self care: bending biomechanics -- working on hip hinge   10/08/24 Initial evaluation of lumbar spine completed followed by trial set and instruction of HEP.                                                                                                                                 PATIENT EDUCATION:  Education details: HEP Person educated: Patient Education method: Programmer, Multimedia, Facilities Manager, Actor cues, Verbal cues, and Handouts Education comprehension: verbalized understanding, returned demonstration, and verbal cues required  HOME EXERCISE PROGRAM: Access Code: [email or web address] URL: [email or web address] Date: 10/08/2024 Prepared by: Burnard Meth  Exercises - Supine Bridge  - 2 x daily - 7 x weekly - 2 sets - 10 reps - 2 hold - Supine Lower Trunk Rotation  - 2 x daily - 7 x weekly - 2 sets - 20 reps - Supine Posterior Pelvic Tilt  - 2 x daily - 7 x weekly - 2 sets - 10 reps - 2 hold - Supine Single Knee to Chest Stretch  - 2 x daily - 7 x weekly - 1 sets - 3 reps - 25 hold  ASSESSMENT:  CLINICAL IMPRESSION: Pt needed VC for TA activation with stabilization exercises.  Demonstrated understanding. HEP updated. STG completed.  From eval: Patient is a 58y.o. female who was seen today for physical therapy evaluation and treatment for low back pain with left sciatica.  Patient presents with decreased ROM, decreased strength and pain. Patient would benefit from skilled physical therapy services to address the above deficits and return to previous level of  activity.  OBJECTIVE IMPAIRMENTS: decreased mobility, decreased ROM, decreased strength, impaired flexibility, and pain.   ACTIVITY LIMITATIONS: lifting, bending, standing, squatting, and transfers  PARTICIPATION LIMITATIONS: community activity and occupation  PERSONAL FACTORS: 1 comorbidity: obesity are also affecting patient's functional outcome.   REHAB POTENTIAL: Good  CLINICAL DECISION MAKING: Stable/uncomplicated  EVALUATION COMPLEXITY: Moderate   GOALS: Goals reviewed with patient? Yes  SHORT TERM GOALS: Target date:  10/28/2024  3 weeks MET  Patient to be independent with HEP. Baseline: Goal status: MET 10/25/24  2.  Decrease pain by 1 level. Baseline:  Goal status: MET 10/25/24  LONG TERM GOALS: Target date: 12/03/2024  8 weeks    Patient to be independent with self progressive HEP at discharge. Baseline:  Goal status: INITIAL  2.  Increase strength of lumbar stabilizers and hip, lower extremity musculature by half a grade to 1 full grade. Baseline:  Goal status: INITIAL  3.  Decrease pain to max 2 out of 10 with all activities. Baseline:  Goal status: INITIAL  4.  Increase score of PSFS by 3 points for measurable improvement. Baseline:  Goal status: INITIAL  5.  Increase active range of motion by 25% or more. Baseline:  Goal status: INITIAL PLAN:  PT FREQUENCY: 2x/week  PT DURATION: 8 weeks  PLANNED INTERVENTIONS: 97164- PT Re-evaluation, 97750- Physical Performance Testing, 97110-Therapeutic exercises, 97530- Therapeutic activity, 97112- Neuromuscular re-education, 97535- Self Care, 02859- Manual therapy, [phone]- Aquatic Therapy, G0283- Electrical stimulation (unattended), [phone] (1-2 muscles), 20561 (3+ muscles)- Dry Needling, Patient/Family education, Balance training, Stair training, Joint mobilization, Spinal mobilization, Scar mobilization, Cryotherapy, and Moist heat.  PLAN FOR NEXT SESSION: Bending, pushing, pulling mechanics for work tasks.     Burnard CHRISTELLA Meth, PT 10/25/2024, 11:22 AM  "

## 2024-10-25 ENCOUNTER — Ambulatory Visit

## 2024-10-25 DIAGNOSIS — M6281 Muscle weakness (generalized): Secondary | ICD-10-CM

## 2024-10-25 DIAGNOSIS — M5459 Other low back pain: Secondary | ICD-10-CM

## 2024-10-30 ENCOUNTER — Encounter

## 2024-11-01 ENCOUNTER — Ambulatory Visit: Admitting: Physician Assistant

## 2024-11-01 ENCOUNTER — Encounter

## 2024-11-06 ENCOUNTER — Encounter

## 2024-11-08 ENCOUNTER — Encounter
